# Patient Record
Sex: Female | Born: 1950 | Race: White | Hispanic: No | Marital: Married | Smoking: Never smoker
Health system: Southern US, Community
[De-identification: ages and names within clinical notes are randomized; demographics above are authoritative.]

## PROBLEM LIST (undated history)

## (undated) DIAGNOSIS — K759 Inflammatory liver disease, unspecified: Secondary | ICD-10-CM

## (undated) DIAGNOSIS — K219 Gastro-esophageal reflux disease without esophagitis: Secondary | ICD-10-CM

## (undated) DIAGNOSIS — R011 Cardiac murmur, unspecified: Secondary | ICD-10-CM

## (undated) DIAGNOSIS — I10 Essential (primary) hypertension: Secondary | ICD-10-CM

## (undated) DIAGNOSIS — I00 Rheumatic fever without heart involvement: Secondary | ICD-10-CM

## (undated) DIAGNOSIS — F419 Anxiety disorder, unspecified: Secondary | ICD-10-CM

## (undated) DIAGNOSIS — M1990 Unspecified osteoarthritis, unspecified site: Secondary | ICD-10-CM

## (undated) DIAGNOSIS — E785 Hyperlipidemia, unspecified: Secondary | ICD-10-CM

## (undated) DIAGNOSIS — E669 Obesity, unspecified: Secondary | ICD-10-CM

## (undated) DIAGNOSIS — E119 Type 2 diabetes mellitus without complications: Secondary | ICD-10-CM

## (undated) DIAGNOSIS — G4733 Obstructive sleep apnea (adult) (pediatric): Secondary | ICD-10-CM

## (undated) DIAGNOSIS — N6019 Diffuse cystic mastopathy of unspecified breast: Secondary | ICD-10-CM

## (undated) HISTORY — DX: Obstructive sleep apnea (adult) (pediatric): G47.33

## (undated) HISTORY — PX: TONSILLECTOMY: SHX5217

## (undated) HISTORY — DX: Anxiety disorder, unspecified: F41.9

## (undated) HISTORY — DX: Cardiac murmur, unspecified: R01.1

## (undated) HISTORY — PX: FOOT SURGERY: SHX648

## (undated) HISTORY — DX: Obesity, unspecified: E66.9

## (undated) HISTORY — DX: Gastro-esophageal reflux disease without esophagitis: K21.9

## (undated) HISTORY — DX: Essential (primary) hypertension: I10

## (undated) HISTORY — DX: Inflammatory liver disease, unspecified: K75.9

## (undated) HISTORY — DX: Unspecified osteoarthritis, unspecified site: M19.90

## (undated) HISTORY — PX: CARDIAC CATHETERIZATION: SHX172

## (undated) HISTORY — DX: Hyperlipidemia, unspecified: E78.5

## (undated) HISTORY — PX: BREAST BIOPSY: SHX20

## (undated) HISTORY — DX: Diffuse cystic mastopathy of unspecified breast: N60.19

## (undated) HISTORY — DX: Rheumatic fever without heart involvement: I00

## (undated) HISTORY — PX: OTHER SURGICAL HISTORY: SHX169

---

## 1992-07-21 HISTORY — PX: TOTAL ABDOMINAL HYSTERECTOMY: SHX209

## 2004-05-30 ENCOUNTER — Ambulatory Visit: Payer: Self-pay | Admitting: General Surgery

## 2005-06-05 ENCOUNTER — Ambulatory Visit: Payer: Self-pay | Admitting: General Surgery

## 2006-06-23 ENCOUNTER — Ambulatory Visit: Payer: Self-pay | Admitting: General Surgery

## 2006-06-30 ENCOUNTER — Ambulatory Visit: Payer: Self-pay | Admitting: Gastroenterology

## 2006-07-21 HISTORY — PX: COLONOSCOPY: SHX174

## 2007-03-26 ENCOUNTER — Ambulatory Visit: Payer: Self-pay | Admitting: Internal Medicine

## 2007-04-30 ENCOUNTER — Ambulatory Visit: Payer: Self-pay | Admitting: Internal Medicine

## 2007-07-08 ENCOUNTER — Ambulatory Visit: Payer: Self-pay | Admitting: Gastroenterology

## 2007-09-21 ENCOUNTER — Ambulatory Visit: Payer: Self-pay | Admitting: General Surgery

## 2008-05-23 LAB — CONVERTED CEMR LAB
ALT: 43 units/L
AST: 26 units/L
Albumin: 4.5 g/dL
BUN: 20 mg/dL
Glomerular Filtration Rate, Af Am: >59 mL/min/{1.73_m2}
Glucose, Bld: 108 mg/dL
HDL: 53 mg/dL
Total Protein: 7.3 g/dL

## 2008-07-11 ENCOUNTER — Encounter: Payer: Self-pay | Admitting: Cardiology

## 2008-09-27 ENCOUNTER — Ambulatory Visit: Payer: Self-pay | Admitting: General Surgery

## 2008-11-09 LAB — CONVERTED CEMR LAB
AST: 28 units/L
BUN: 22 mg/dL
CO2: 22 meq/L
Cholesterol CEMR: 258 mg/dL
Creatinine, Ser: 0.82 mg/dL
GFR calc non Af Amer: >59 mL/min
Glomerular Filtration Rate, Af Am: >59 mL/min/{1.73_m2}
Total Bilirubin: 0.4 mg/dL

## 2009-05-22 ENCOUNTER — Ambulatory Visit: Payer: Self-pay | Admitting: Cardiology

## 2009-05-22 DIAGNOSIS — R079 Chest pain, unspecified: Secondary | ICD-10-CM

## 2009-05-22 DIAGNOSIS — I10 Essential (primary) hypertension: Secondary | ICD-10-CM | POA: Insufficient documentation

## 2009-05-22 DIAGNOSIS — E785 Hyperlipidemia, unspecified: Secondary | ICD-10-CM | POA: Insufficient documentation

## 2009-05-22 DIAGNOSIS — Z8679 Personal history of other diseases of the circulatory system: Secondary | ICD-10-CM

## 2009-05-22 DIAGNOSIS — E1169 Type 2 diabetes mellitus with other specified complication: Secondary | ICD-10-CM | POA: Insufficient documentation

## 2009-05-23 ENCOUNTER — Ambulatory Visit: Payer: Self-pay | Admitting: Internal Medicine

## 2009-05-23 ENCOUNTER — Encounter: Payer: Self-pay | Admitting: Cardiology

## 2009-05-28 LAB — CONVERTED CEMR LAB
ALT: 28 units/L (ref 0–35)
AST: 20 units/L (ref 0–37)
Alkaline Phosphatase: 52 units/L (ref 39–117)
Calcium: 10.2 mg/dL (ref 8.4–10.5)
Chloride: 98 meq/L (ref 96–112)
Creatinine, Ser: 0.67 mg/dL (ref 0.40–1.20)
LDL Cholesterol: 130 mg/dL — ABNORMAL HIGH (ref 0–99)
Potassium: 5.1 meq/L (ref 3.5–5.3)
Total CHOL/HDL Ratio: 4.9

## 2009-06-05 ENCOUNTER — Ambulatory Visit: Payer: Self-pay

## 2009-06-05 ENCOUNTER — Encounter: Payer: Self-pay | Admitting: Cardiology

## 2009-06-05 ENCOUNTER — Ambulatory Visit: Payer: Self-pay | Admitting: Cardiovascular Disease

## 2009-06-07 ENCOUNTER — Ambulatory Visit: Payer: Self-pay | Admitting: Cardiology

## 2009-06-12 LAB — CONVERTED CEMR LAB
Glucose, Bld: 90 mg/dL (ref 70–99)
Potassium: 3.8 meq/L (ref 3.5–5.3)
Sodium: 136 meq/L (ref 135–145)

## 2009-08-09 ENCOUNTER — Encounter: Payer: Self-pay | Admitting: Cardiology

## 2009-08-09 ENCOUNTER — Ambulatory Visit: Payer: Self-pay | Admitting: Internal Medicine

## 2009-08-14 LAB — CONVERTED CEMR LAB
ALT: 19 units/L (ref 0–35)
Albumin: 4.5 g/dL (ref 3.5–5.2)
CO2: 26 meq/L (ref 19–32)
Calcium: 8.9 mg/dL (ref 8.4–10.5)
Chloride: 97 meq/L (ref 96–112)
Cholesterol: 196 mg/dL (ref 0–200)
Glucose, Bld: 106 mg/dL — ABNORMAL HIGH (ref 70–99)
Potassium: 4.1 meq/L (ref 3.5–5.3)
Sodium: 140 meq/L (ref 135–145)
Total Bilirubin: 0.4 mg/dL (ref 0.3–1.2)
Total Protein: 7 g/dL (ref 6.0–8.3)
Triglycerides: 147 mg/dL (ref <150)

## 2009-09-19 ENCOUNTER — Encounter: Payer: Self-pay | Admitting: Cardiology

## 2009-09-28 ENCOUNTER — Ambulatory Visit: Payer: Self-pay | Admitting: General Surgery

## 2009-12-26 ENCOUNTER — Ambulatory Visit: Payer: Self-pay | Admitting: Cardiovascular Disease

## 2010-07-21 DIAGNOSIS — N6019 Diffuse cystic mastopathy of unspecified breast: Secondary | ICD-10-CM

## 2010-07-21 HISTORY — DX: Diffuse cystic mastopathy of unspecified breast: N60.19

## 2010-08-20 NOTE — Assessment & Plan Note (Signed)
Summary: EC6/AMD   Visit Type:  Initial Consult Primary Provider:  Corky Downs, MD  CC:  pt. previously saw Dr. Shirlee Latch. edema in feet.Marland Kitchen  History of Present Illness: 60 yo with history of HTN, hyperlipidemia, and family history of CAD comes for followup.   She reports that overall she has been doing well. Her blood pressure has been reasonably well-controlled at home. Her work has been stressful. She has noticed worsening lower extremity edema on the amlodipine, possibly also from the Diovan. She like to change her medications to decrease her swelling. She did try TED hose but by the end of the day, these were very tight and uncomfortable.  strong family history of coronary artery disease. The patient has never smoked.   Labs (11/10): creatinine 0.59, K 3.8, LDL 130, HDL 44, LFTs normal  Current Medications (verified): 1)  Diovan Hct 320-25 Mg Tabs (Valsartan-Hydrochlorothiazide) .Marland Kitchen.. 1 Tab By Mouth Daily 2)  Protonix 40 Mg Tbec (Pantoprazole Sodium) .Marland Kitchen.. 1 By Mouth Once Daily 3)  Crestor 20 Mg Tabs (Rosuvastatin Calcium) .... Take One Tablet By Mouth Daily. 4)  Furosemide 20 Mg Tabs (Furosemide) .... Take One Tablet By Mouth Daily or As Needed 5)  Alprazolam 0.25 Mg Tabs (Alprazolam) .Marland Kitchen.. 1 By Mouth At Bedtime, As Needed 6)  Loratadine 10 Mg Tabs (Loratadine) .Marland Kitchen.. 1 By Mouth Once Daily 7)  Aspirin 81 Mg Tbec (Aspirin) .... Take One Tablet By Mouth Daily 8)  Melatonin 3 Mg Tabs (Melatonin) .Marland Kitchen.. 1 By Mouth Once Daily 9)  Tylenol Extra Strength 500 Mg Tabs (Acetaminophen) .Marland Kitchen.. 10-12 By Mouth Once Daily 10)  Norvasc 10 Mg Tabs (Amlodipine Besylate) .Marland Kitchen.. 1 Tab By Mouth Daily 11)  Vitamin D 1000 Unit  Tabs (Cholecalciferol) .... 2 Tab Once Daily  Allergies (verified): No Known Drug Allergies  Past History:  Past Medical History: Last updated: 06/07/2009 1. Anxiety 2. Arthritis 3. G E R D 4. Hyperlipidemia 5. Hypertension for > 10 years 6. Hx as child of rheumatic fever 7. Hx as  child of hepatitis 8. Heart Murmur 9. ETT-myoview 12/09 (Dr. Juel Burrow): No evidence for ischemia, good exercise tolerance (9 minutes) 10. Mild OSA, uses CPAP 11. Obesity 12. Echo (11/10): EF 60-65%, normal RV, PASP 25, normal valves  Past Surgical History: Last updated: 05/22/2009 Abdominal Hysterectomy-Total Tonsillectomy Reconstruction foot and bunion surgery both feet Carpel tunnel right hand Arthroscopic sugery on left elbow   Family History: Last updated: 05/22/2009 Father: living 66: DM, HTN, PPM Mother: deceased 65: MS, hyperlipidemia Brother: deceased 27; brain aneurysm Sister: living 56; 1 MI in late 29s, 1 MI at 37, HTN, hyperlipidemia  Social History: Last updated: 05/22/2009 Works full time Married  Tobacco Use - No.  Alcohol Use - yes - social Regular Exercise - no Drug Use - no  Risk Factors: Alcohol Use: <1 (05/22/2009) Caffeine Use: decaf coffee (05/22/2009) Diet: Weight Watchers (05/22/2009) Exercise: no (05/22/2009)  Risk Factors: Smoking Status: never (05/22/2009)  Review of Systems       The patient complains of weight gain and peripheral edema.  The patient denies fever, weight loss, vision loss, decreased hearing, hoarseness, chest pain, syncope, dyspnea on exertion, prolonged cough, abdominal pain, incontinence, muscle weakness, depression, and enlarged lymph nodes.    Vital Signs:  Patient profile:   60 year old female Height:      64 inches Weight:      229 pounds BMI:     39.45 Pulse rate:   79 / minute BP sitting:  156 / 97  (left arm) Cuff size:   large  Vitals Entered By: Bishop Dublin, CMA (December 26, 2009 3:35 PM)  Physical Exam  General:  Well developed, well nourished, in no acute distress. Head:  normocephalic and atraumatic Neck:  Neck supple, no JVD. No masses, thyromegaly or abnormal cervical nodes. Lungs:  Clear bilaterally to auscultation and percussion. Heart:  Non-displaced PMI, chest non-tender; regular rate and  rhythm, S1, S2 without murmurs, rubs or gallops. Carotid upstroke normal, no bruit.  Pedals normal pulses. No edema, no varicosities. Abdomen:  Bowel sounds positive; abdomen soft and non-tender without masses Msk:  Back normal, normal gait. Muscle strength and tone normal. Pulses:  pulses normal in all 4 extremities Extremities:  No clubbing or cyanosis. Neurologic:  Alert and oriented x 3. Skin:  Intact without lesions or rashes. Psych:  Normal affect.   EKG  Procedure date:  12/26/2009  Findings:      Normal sinus rhythm with rate 79 beats per minute, no significant ST or T wave changes.  Impression & Recommendations:  Problem # 1:  HYPERTENSION, UNSPECIFIED (ICD-401.9) we have made several medication changes to her regiment. She does not like the lower extremity swelling and attributes this to Diovan as well as amlodipine.  We will discontinue her amlodipine. Discontinue the Diovan. We will start lisinopril 40 mg daily, increase her HCTZ to 50 mg daily though I have suggested she could take 25 mg if her blood pressure is well controlled, we have also added clonidine 0.1 mg b.i.d.   The following medications were removed from the medication list:    Diovan Hct 320-25 Mg Tabs (Valsartan-hydrochlorothiazide) .Marland Kitchen... 1 tab by mouth daily Her updated medication list for this problem includes:    Furosemide 20 Mg Tabs (Furosemide) .Marland Kitchen... Take one tablet by mouth daily or as needed    Aspirin 81 Mg Tbec (Aspirin) .Marland Kitchen... Take one tablet by mouth daily    Norvasc 10 Mg Tabs (Amlodipine besylate) .Marland Kitchen... 1 tab by mouth daily    Lisinopril 40 Mg Tabs (Lisinopril) .Marland Kitchen... Take one tablet by mouth daily    Hydrochlorothiazide 50 Mg Tabs (Hydrochlorothiazide) .Marland Kitchen... Take one tablet by mouth daily.    Clonidine Hcl 0.1 Mg Tabs (Clonidine hcl) .Marland Kitchen... Take one tablet by mouth twice a day  Problem # 2:  HYPERLIPIDEMIA-MIXED (ICD-272.4) She has a strong family history of coronary artery disease. Her LDL  continues to be slightly above our goal of LDL less than 100. We'll add zetia 10 mg daily. She is due to have her cholesterol checked in one month's time with her primary care physician.  Her updated medication list for this problem includes:    Crestor 20 Mg Tabs (Rosuvastatin calcium) .Marland Kitchen... Take one tablet by mouth daily.    Zetia 10 Mg Tabs (Ezetimibe) .Marland Kitchen... Take one tablet by mouth daily.   Patient Instructions: 1)  Your physician has recommended you make the following change in your medication: STOP Diovan  Start HCTZ 50mg  daily, Lisinopril 40mg  daily, Clonidine 0.1 mg two times a day, Zetia 10 mg daily  2)  Your physician wants you to follow-up in:   6 months You will receive a reminder letter in the mail two months in advance. If you don't receive a letter, please call our office to schedule the follow-up appointment. Prescriptions: ZETIA 10 MG TABS (EZETIMIBE) Take one tablet by mouth daily.  #30 x 6   Entered by:   Benedict Needy, RN   Authorized  by:   Dossie Arbour MD   Signed by:   Benedict Needy, RN on 12/26/2009   Method used:   Electronically to        [facility]. # [phone]* (retail)       [address]       [place], [place]  [address]       Ph: [phone]       Fax: [phone]   RxID:   [id number] CLONIDINE HCL 0.1 MG TABS (CLONIDINE HCL) Take one tablet by mouth twice a day  #60 x 6   Entered by:   Benedict Needy, RN   Authorized by:   Dossie Arbour MD   Signed by:   Benedict Needy, RN on 12/26/2009   Method used:   Electronically to        [facility]. # [phone]* (retail)       [address]       [place], [place]  [address]       Ph: [phone]       Fax: [phone]   RxID:   [id number] HYDROCHLOROTHIAZIDE 50 MG TABS (HYDROCHLOROTHIAZIDE) Take one tablet by mouth daily.  #30 x 6   Entered by:   Benedict Needy, RN   Authorized by:   Dossie Arbour MD   Signed by:   Benedict Needy, RN on 12/26/2009   Method used:    Electronically to        [facility]. # [phone]* (retail)       [address]       [place], [place]  [address]       Ph: [phone]       Fax: [phone]   RxID:   [id number] LISINOPRIL 40 MG TABS (LISINOPRIL) Take one tablet by mouth daily  #30 x 6   Entered by:   Benedict Needy, RN   Authorized by:   Dossie Arbour MD   Signed by:   Benedict Needy, RN on 12/26/2009   Method used:   Electronically to        [facility]. # [phone]* (retail)       [address]       [place], [place]  [address]       Ph: [phone]       Fax: [phone]   RxID:   [id number]

## 2010-08-20 NOTE — Letter (Signed)
Summary: Medical Record Release  Medical Record Release   Imported By: Harlon Flor 09/21/2009 11:25:09  _____________________________________________________________________  External Attachment:    Type:   Image     Comment:   External Document

## 2010-09-04 ENCOUNTER — Encounter: Payer: Self-pay | Admitting: Cardiovascular Disease

## 2010-09-04 ENCOUNTER — Ambulatory Visit (INDEPENDENT_AMBULATORY_CARE_PROVIDER_SITE_OTHER): Payer: BC Managed Care – PPO | Admitting: Cardiovascular Disease

## 2010-09-04 DIAGNOSIS — I10 Essential (primary) hypertension: Secondary | ICD-10-CM

## 2010-09-04 DIAGNOSIS — E785 Hyperlipidemia, unspecified: Secondary | ICD-10-CM

## 2010-09-11 ENCOUNTER — Encounter: Payer: Self-pay | Admitting: Cardiovascular Disease

## 2010-09-11 ENCOUNTER — Other Ambulatory Visit (INDEPENDENT_AMBULATORY_CARE_PROVIDER_SITE_OTHER): Payer: BC Managed Care – PPO

## 2010-09-11 DIAGNOSIS — R5381 Other malaise: Secondary | ICD-10-CM | POA: Insufficient documentation

## 2010-09-11 DIAGNOSIS — R5383 Other fatigue: Secondary | ICD-10-CM

## 2010-09-11 DIAGNOSIS — E559 Vitamin D deficiency, unspecified: Secondary | ICD-10-CM

## 2010-09-11 NOTE — Assessment & Plan Note (Signed)
Summary: f23m/sab   Visit Type:  Follow-up Primary Provider:  Cher Brock  CC:  "doing well" denies chest pain and SOB.Erica Brock  History of Present Illness: 60 yo with history of HTN, hyperlipidemia, obesity and family history of CAD comes for followup.   she feels well with no significant symptoms of edema, shortness of breath or chest pain. After stopping amlodipine, her edema did improve. She has been tolerating her medications well with no symptoms.   she reports having lost 19 pounds through watching her diet.   she does have a strong family history of coronary artery disease. The patient has never smoked.   Labs (11/10): creatinine 0.59, K 3.8, LDL 130, HDL 44, LFTs normal  Current Medications (verified): 1)  Protonix 40 Mg Tbec (Pantoprazole Sodium) .Erica Brock.. 1 By Mouth Once Daily 2)  Crestor 20 Mg Tabs (Rosuvastatin Calcium) .... Take One Tablet By Mouth Daily. 3)  Furosemide 20 Mg Tabs (Furosemide) .... Take One Tablet By Mouth Daily or As Needed 4)  Alprazolam 0.25 Mg Tabs (Alprazolam) .Erica Brock.. 1 By Mouth At Bedtime, As Needed 5)  Loratadine 10 Mg Tabs (Loratadine) .Erica Brock.. 1 By Mouth Once Daily 6)  Aspirin 81 Mg Tbec (Aspirin) .... Take One Tablet By Mouth Daily 7)  Melatonin 3 Mg Tabs (Melatonin) .Erica Brock.. 1 By Mouth Once Daily 8)  Tylenol Extra Strength 500 Mg Tabs (Acetaminophen) .Erica Brock.. 10-12 By Mouth Once Daily 9)  Norvasc 10 Mg Tabs (Amlodipine Besylate) .Erica Brock.. 1 Tab By Mouth Daily 10)  Vitamin D 1000 Unit  Tabs (Cholecalciferol) .... 2 Tab Once Daily 11)  Lisinopril 40 Mg Tabs (Lisinopril) .... Take One Tablet By Mouth Daily 12)  Hydrochlorothiazide 50 Mg Tabs (Hydrochlorothiazide) .... Take One Tablet By Mouth Daily. 13)  Clonidine Hcl 0.1 Mg Tabs (Clonidine Hcl) .... Take One Tablet By Mouth Twice A Day 14)  Zetia 10 Mg Tabs (Ezetimibe) .... Take One Tablet By Mouth Daily. 15)  Mobic 7.5 Mg Tabs (Meloxicam) .Erica Brock.. 1 Tablet Two Times A Day  Allergies (verified): No Known Drug  Allergies  Past History:  Past Medical History: Last updated: 06/07/2009 1. Anxiety 2. Arthritis 3. G E R D 4. Hyperlipidemia 5. Hypertension for > 10 years 6. Hx as child of rheumatic fever 7. Hx as child of hepatitis 8. Heart Murmur 9. ETT-myoview 12/09 (Dr. Juel Burrow): No evidence for ischemia, good exercise tolerance (9 minutes) 10. Mild OSA, uses CPAP 11. Obesity 12. Echo (11/10): EF 60-65%, normal RV, PASP 25, normal valves  Past Surgical History: Last updated: 05/22/2009 Abdominal Hysterectomy-Total Tonsillectomy Reconstruction foot and bunion surgery both feet Carpel tunnel right hand Arthroscopic sugery on left elbow   Family History: Last updated: 05/22/2009 Father: living 93: DM, HTN, PPM Mother: deceased 66: MS, hyperlipidemia Brother: deceased 61; brain aneurysm Sister: living 32; 1 MI in late 98s, 1 MI at 12, HTN, hyperlipidemia  Social History: Last updated: 05/22/2009 Works full time Married  Tobacco Use - No.  Alcohol Use - yes - social Regular Exercise - no Drug Use - no  Risk Factors: Alcohol Use: <1 (05/22/2009) Caffeine Use: decaf coffee (05/22/2009) Diet: Weight Watchers (05/22/2009) Exercise: no (05/22/2009)  Risk Factors: Smoking Status: never (05/22/2009)  Review of Systems       The patient complains of weight loss.  The patient denies fever, weight gain, vision loss, decreased hearing, hoarseness, chest pain, syncope, dyspnea on exertion, peripheral edema, prolonged cough, abdominal pain, incontinence, muscle weakness, depression, and enlarged lymph nodes.    Vital Signs:  Patient profile:   60 year old female Height:      64 inches Weight:      210.50 pounds BMI:     36.26 Pulse rate:   75 / minute BP sitting:   146 / 83  (left arm) Cuff size:   large  Vitals Entered By: Lysbeth Galas CMA (September 04, 2010 4:11 PM)  Physical Exam  General:  Well developed, well nourished, in no acute distress.obese Head:  normocephalic  and atraumatic Neck:  Neck supple, no JVD. No masses, thyromegaly or abnormal cervical nodes. Lungs:  Clear bilaterally to auscultation and percussion. Heart:  Non-displaced PMI, chest non-tender; regular rate and rhythm, S1, S2 without murmurs, rubs or gallops. Carotid upstroke normal, no bruit.  Pedals normal pulses. No edema, no varicosities. Abdomen:  Bowel sounds positive; abdomen soft and non-tender without masses Msk:  Back normal, normal gait. Muscle strength and tone normal. Pulses:  pulses normal in all 4 extremities Extremities:  No clubbing or cyanosis. Neurologic:  Alert and oriented x 3. Skin:  Intact without lesions or rashes. Psych:  Normal affect.   Impression & Recommendations:  Problem # 1:  HYPERTENSION, UNSPECIFIED (ICD-401.9) BP is well controlled. no medication changes were made.  The following medications were removed from the medication list:    Norvasc 10 Mg Tabs (Amlodipine besylate) .Erica Brock... 1 tab by mouth daily Her updated medication list for this problem includes:    Furosemide 20 Mg Tabs (Furosemide) .Erica Brock... Take one tablet by mouth daily or as needed    Aspirin 81 Mg Tbec (Aspirin) .Erica Brock... Take one tablet by mouth daily    Lisinopril 40 Mg Tabs (Lisinopril) .Erica Brock... Take one tablet by mouth daily    Hydrochlorothiazide 50 Mg Tabs (Hydrochlorothiazide) .Erica Brock... Take one tablet by mouth daily.    Clonidine Hcl 0.1 Mg Tabs (Clonidine hcl) .Erica Brock... Take one tablet by mouth twice a day  Problem # 2:  HYPERLIPIDEMIA-MIXED (ICD-272.4) We will check her cholesterol and LFTs this week. Continue Crestor and zetia  Her updated medication list for this problem includes:    Crestor 20 Mg Tabs (Rosuvastatin calcium) .Erica Brock... Take one tablet by mouth daily.    Zetia 10 Mg Tabs (Ezetimibe) .Erica Brock... Take one tablet by mouth daily.  Other Orders: EKG w/ Interpretation (93000)  Patient Instructions: 1)  Your physician recommends that you schedule a follow-up appointment in: 1 year 2)   Your physician recommends that you return for a FASTING lipid profile: Please call our office and bring your lab order and we can draw labs. 3)  Your physician has recommended you make the following change in your medication: START Crestor 20mg  once daily. Prescriptions: CRESTOR 20 MG TABS (ROSUVASTATIN CALCIUM) Take one tablet by mouth daily.  #30 x 6   Entered by:   Lanny Hurst RN   Authorized by:   Dossie Arbour MD   Signed by:   Lanny Hurst RN on 09/04/2010   Method used:   Electronically to        [facility]. # [phone]* (retail)       [address]       [place], [place]  [address]       Ph: [phone]       Fax: [phone]   RxID:   [id number]   Appended Document: f59m/sab EKG shows normal sinus rhythm with rate of 75 beats per minute, left axis deviation

## 2010-09-16 LAB — CONVERTED CEMR LAB
CO2: 25 meq/L (ref 19–32)
Cholesterol: 132 mg/dL (ref 0–200)
Eosinophils Relative: 2 % (ref 0–5)
Glucose, Bld: 130 mg/dL — ABNORMAL HIGH (ref 70–99)
HCT: 35.2 % — ABNORMAL LOW (ref 36.0–46.0)
Hemoglobin: 11.7 g/dL — ABNORMAL LOW (ref 12.0–15.0)
Lymphocytes Relative: 33 % (ref 12–46)
Lymphs Abs: 2 10*3/uL (ref 0.7–4.0)
Monocytes Absolute: 0.6 10*3/uL (ref 0.1–1.0)
Monocytes Relative: 10 % (ref 3–12)
Platelets: 253 10*3/uL (ref 150–400)
RBC: 3.7 M/uL — ABNORMAL LOW (ref 3.87–5.11)
Sodium: 135 meq/L (ref 135–145)
Total Bilirubin: 0.3 mg/dL (ref 0.3–1.2)
Total Protein: 6.5 g/dL (ref 6.0–8.3)
Triglycerides: 96 mg/dL (ref <150)
VLDL: 19 mg/dL (ref 0–40)
WBC: 6 10*3/uL (ref 4.0–10.5)

## 2010-10-02 ENCOUNTER — Ambulatory Visit: Payer: Self-pay | Admitting: General Surgery

## 2010-10-16 ENCOUNTER — Other Ambulatory Visit: Payer: Self-pay

## 2010-10-16 MED ORDER — EZETIMIBE 10 MG PO TABS: 10.0000 mg | ORAL_TABLET | Freq: Every day | ORAL | Status: DC

## 2011-02-05 ENCOUNTER — Encounter: Payer: Self-pay | Admitting: Cardiovascular Disease

## 2011-02-19 ENCOUNTER — Telehealth: Payer: Self-pay

## 2011-02-19 MED ORDER — CLONIDINE HCL 0.1 MG PO TABS: 0.1000 mg | ORAL_TABLET | Freq: Two times a day (BID) | ORAL | Status: DC

## 2011-02-19 NOTE — Telephone Encounter (Signed)
Requested a refill on clonidine.

## 2011-02-26 ENCOUNTER — Telehealth: Payer: Self-pay | Admitting: *Deleted

## 2011-02-26 MED ORDER — HYDROCHLOROTHIAZIDE 50 MG PO TABS: 50.0000 mg | ORAL_TABLET | Freq: Every day | ORAL | Status: DC

## 2011-02-26 NOTE — Telephone Encounter (Signed)
Request refill for Hydrochlorothiazide

## 2011-03-20 ENCOUNTER — Ambulatory Visit: Payer: Self-pay | Admitting: Specialist

## 2011-03-28 ENCOUNTER — Ambulatory Visit: Payer: Self-pay | Admitting: Specialist

## 2011-04-21 ENCOUNTER — Telehealth: Payer: Self-pay

## 2011-04-21 MED ORDER — LISINOPRIL 40 MG PO TABS: 40.0000 mg | ORAL_TABLET | Freq: Every day | ORAL | Status: DC

## 2011-04-21 NOTE — Telephone Encounter (Signed)
Refill sent for lisinopril 40 mg table daily.

## 2011-05-01 ENCOUNTER — Other Ambulatory Visit: Payer: Self-pay | Admitting: *Deleted

## 2011-05-01 MED ORDER — ROSUVASTATIN CALCIUM 20 MG PO TABS: 20.0000 mg | ORAL_TABLET | Freq: Every day | ORAL | Status: DC

## 2011-05-27 ENCOUNTER — Other Ambulatory Visit: Payer: Self-pay | Admitting: Cardiovascular Disease

## 2011-05-27 NOTE — Telephone Encounter (Signed)
Refill sent for zetia.  

## 2011-09-01 ENCOUNTER — Encounter: Payer: Self-pay | Admitting: *Deleted

## 2011-09-02 ENCOUNTER — Ambulatory Visit (INDEPENDENT_AMBULATORY_CARE_PROVIDER_SITE_OTHER): Payer: BC Managed Care – PPO | Admitting: Cardiovascular Disease

## 2011-09-02 ENCOUNTER — Encounter: Payer: Self-pay | Admitting: Cardiovascular Disease

## 2011-09-02 ENCOUNTER — Encounter: Payer: Self-pay | Admitting: *Deleted

## 2011-09-02 VITALS — BP 146/99 | HR 88 | Ht 64.0 in | Wt 214.8 lb

## 2011-09-02 DIAGNOSIS — Z9989 Dependence on other enabling machines and devices: Secondary | ICD-10-CM

## 2011-09-02 DIAGNOSIS — G4700 Insomnia, unspecified: Secondary | ICD-10-CM | POA: Insufficient documentation

## 2011-09-02 DIAGNOSIS — E785 Hyperlipidemia, unspecified: Secondary | ICD-10-CM

## 2011-09-02 DIAGNOSIS — G4733 Obstructive sleep apnea (adult) (pediatric): Secondary | ICD-10-CM | POA: Insufficient documentation

## 2011-09-02 DIAGNOSIS — I10 Essential (primary) hypertension: Secondary | ICD-10-CM

## 2011-09-02 DIAGNOSIS — E669 Obesity, unspecified: Secondary | ICD-10-CM

## 2011-09-02 NOTE — Assessment & Plan Note (Signed)
She has obstructive sleep apnea and is on CPAP. She may benefit from an auto titration study to Reassess her pressures given her insomnia.

## 2011-09-02 NOTE — Patient Instructions (Signed)
You are doing well. No medication changes were made.  Please call us if you have new issues that need to be addressed before your next appt.  Your physician wants you to follow-up in: 12 months.  You will receive a reminder letter in the mail two months in advance. If you don't receive a letter, please call our office to schedule the follow-up appointment. 

## 2011-09-02 NOTE — Assessment & Plan Note (Addendum)
Cholesterol is at goal on the current lipid regimen. No changes to the medications were made. We did suggest she could hold her zetia with a recheck of her labs in several months in an effort to possibly save money.

## 2011-09-02 NOTE — Assessment & Plan Note (Signed)
We have encouraged continued exercise, careful diet management in an effort to lose weight. 

## 2011-09-02 NOTE — Progress Notes (Signed)
Patient ID: Erica Brock, female    DOB: 1951/02/14, 61 y.o.   MRN: [id number]  HPI Comments: 61 yo with history of HTN, hyperlipidemia, obesity, Obstructive sleep apnea who wears CPAP, and family history of CAD comes for followup.     she feels well with no significant symptoms of edema, shortness of breath or chest pain. She has poor sleep and wakes frequently during the night. She has tinnitus which has been chronic. She has rare episodes of dizziness, typically when standing from a sitting position.  Blood pressure at home has typically been well controlled with systolic pressures in the 130 range.  The patient has never smoked.     EKG shows normal sinus rhythm with rate 80 beats per minute no significant ST or T wave changes, left axis deviation      Outpatient Encounter Prescriptions as of 09/02/2011  Medication Sig Dispense Refill  . acetaminophen (TYLENOL) 500 MG tablet Take 500 mg by mouth every 6 (six) hours as needed.        Marland Kitchen aspirin 81 MG EC tablet Take 81 mg by mouth daily.        . cholecalciferol (VITAMIN D) 1000 UNITS tablet Take 2,000 Units by mouth daily.        . cloNIDine (CATAPRES) 0.1 MG tablet Take 1 tablet (0.1 mg total) by mouth 2 (two) times daily.  60 tablet  6  . hydrochlorothiazide 50 MG tablet Take 1 tablet (50 mg total) by mouth daily.  30 tablet  6  . lisinopril (PRINIVIL,ZESTRIL) 40 MG tablet Take 1 tablet (40 mg total) by mouth daily.  30 tablet  6  . loratadine (CLARITIN) 10 MG tablet Take 10 mg by mouth as needed.       . Melatonin 10 MG TABS Take 1 tablet by mouth at bedtime.      . pantoprazole (PROTONIX) 40 MG tablet Take 40 mg by mouth daily.        . rosuvastatin (CRESTOR) 20 MG tablet Take 1 tablet (20 mg total) by mouth daily.  30 tablet  6  . ZETIA 10 MG tablet TAKE 1 TABLET BY MOUTH DAILY  30 tablet  6  . furosemide (LASIX) 20 MG tablet Take 20 mg by mouth as needed.        Review of Systems  Constitutional: Negative.   HENT: Negative.     Eyes: Negative.   Respiratory: Negative.   Cardiovascular: Negative.   Gastrointestinal: Negative.   Musculoskeletal: Negative.   Skin: Negative.   Neurological: Positive for dizziness.  Hematological: Negative.   Psychiatric/Behavioral: Negative.        Poor sleep  All other systems reviewed and are negative.     BP 146/99  Pulse 88  Ht 5\' 4"  (1.626 m)  Wt 214 lb 12.8 oz (97.433 kg)  BMI 36.87 kg/m2  Physical Exam  Nursing note and vitals reviewed. Constitutional: She is oriented to person, place, and time. She appears well-developed and well-nourished.       Obese  HENT:  Head: Normocephalic.  Nose: Nose normal.  Mouth/Throat: Oropharynx is clear and moist.  Eyes: Conjunctivae are normal. Pupils are equal, round, and reactive to light.  Neck: Normal range of motion. Neck supple. No JVD present.  Cardiovascular: Normal rate, regular rhythm, S1 normal, S2 normal, normal heart sounds and intact distal pulses.  Exam reveals no gallop and no friction rub.   No murmur heard. Pulmonary/Chest: Effort normal and breath sounds normal. No respiratory  distress. She has no wheezes. She has no rales. She exhibits no tenderness.  Abdominal: Soft. Bowel sounds are normal. She exhibits no distension. There is no tenderness.  Musculoskeletal: Normal range of motion. She exhibits no edema and no tenderness.  Lymphadenopathy:    She has no cervical adenopathy.  Neurological: She is alert and oriented to person, place, and time. Coordination normal.  Skin: Skin is warm and dry. No rash noted. No erythema.  Psychiatric: She has a normal mood and affect. Her behavior is normal. Judgment and thought content normal.         Assessment and Plan

## 2011-09-02 NOTE — Assessment & Plan Note (Signed)
Blood pressure is elevated on today's visit. We did suggest she monitor her blood pressure at home. If it continues to be elevated, she could increase her clonidine.

## 2011-09-11 ENCOUNTER — Other Ambulatory Visit: Payer: Self-pay | Admitting: Cardiovascular Disease

## 2011-09-18 ENCOUNTER — Other Ambulatory Visit: Payer: Self-pay | Admitting: Cardiovascular Disease

## 2011-10-05 ENCOUNTER — Ambulatory Visit: Payer: Self-pay | Admitting: Family Medicine

## 2011-10-15 ENCOUNTER — Ambulatory Visit: Payer: Self-pay | Admitting: General Surgery

## 2011-11-06 ENCOUNTER — Other Ambulatory Visit: Payer: Self-pay | Admitting: Cardiovascular Disease

## 2011-11-20 ENCOUNTER — Other Ambulatory Visit: Payer: Self-pay | Admitting: Cardiovascular Disease

## 2011-11-21 ENCOUNTER — Other Ambulatory Visit: Payer: Self-pay | Admitting: *Deleted

## 2011-11-21 MED ORDER — ROSUVASTATIN CALCIUM 20 MG PO TABS: 20.0000 mg | ORAL_TABLET | Freq: Every day | ORAL | Status: DC

## 2011-11-21 NOTE — Telephone Encounter (Signed)
Refilled Crestor. 

## 2011-12-25 ENCOUNTER — Other Ambulatory Visit: Payer: Self-pay | Admitting: Cardiovascular Disease

## 2011-12-25 NOTE — Telephone Encounter (Signed)
Refilled Zetia.

## 2012-02-05 ENCOUNTER — Ambulatory Visit: Payer: Self-pay | Admitting: Family Medicine

## 2012-03-15 ENCOUNTER — Other Ambulatory Visit: Payer: Self-pay | Admitting: Cardiovascular Disease

## 2012-03-15 NOTE — Telephone Encounter (Signed)
Refilled Clonidine

## 2012-03-23 ENCOUNTER — Other Ambulatory Visit: Payer: Self-pay | Admitting: Cardiovascular Disease

## 2012-03-24 ENCOUNTER — Other Ambulatory Visit: Payer: Self-pay | Admitting: *Deleted

## 2012-03-24 MED ORDER — ROSUVASTATIN CALCIUM 20 MG PO TABS: 20.0000 mg | ORAL_TABLET | Freq: Every day | ORAL | Status: DC

## 2012-03-24 MED ORDER — HYDROCHLOROTHIAZIDE 50 MG PO TABS: 50.0000 mg | ORAL_TABLET | Freq: Every day | ORAL | Status: DC

## 2012-03-24 NOTE — Telephone Encounter (Signed)
Refilled Hydrochlorothiazide and Crestor.

## 2012-05-04 ENCOUNTER — Telehealth: Payer: Self-pay | Admitting: Cardiovascular Disease

## 2012-05-04 NOTE — Telephone Encounter (Signed)
Pt would like to know if we have any discount coupons for her Crestor 20mg  and Zetia 10mg .  She states the cost is going up due to insurance change and she may need to discuss cheaper alternatives.  Please call pt to advise.

## 2012-05-04 NOTE — Telephone Encounter (Signed)
Please see below. thanks

## 2012-05-05 NOTE — Telephone Encounter (Signed)
Patient called back to see if this message had been addressed yet

## 2012-05-05 NOTE — Telephone Encounter (Signed)
Notified patient we do have some coupons for zetia and crestor. Told patient we would give samples of crestor and zetia until she decides if the coupons will help the cost or not.

## 2012-05-07 NOTE — Telephone Encounter (Signed)
Dr. Mariah Milling the patient would like to switch her cholesterol medications to something else not so expensive. She did take a coupon and medication still too expensive. Please advise.

## 2012-05-07 NOTE — Telephone Encounter (Signed)
Pt decided that the coupons are not enough to help with the cost.  Pt would like to discuss other medication options.  Please call to advise.

## 2012-05-10 NOTE — Telephone Encounter (Signed)
Best option is lipitor 40 mg daily, Highest dose is 80 mg daily Could try high dose later if needed

## 2012-05-11 ENCOUNTER — Other Ambulatory Visit: Payer: Self-pay | Admitting: Cardiovascular Disease

## 2012-05-11 NOTE — Telephone Encounter (Signed)
Refilled Lisinopril. 

## 2012-05-11 NOTE — Telephone Encounter (Signed)
See below

## 2012-05-14 NOTE — Telephone Encounter (Signed)
Notified patient since can't afford the crestor need to continue the samples of crestor then start on the new Rx of lipitor 40 mg take one tablet daily at bedtime. The patient will not be able to afford the zetia either. She will continue with her zetia samples that she was given. Told her that later if needed could increase the Lipitor to 80 mg daily.  Do you have any other suggestions for her?

## 2012-05-21 ENCOUNTER — Other Ambulatory Visit: Payer: Self-pay

## 2012-05-21 MED ORDER — ATORVASTATIN CALCIUM 40 MG PO TABS: 40.0000 mg | ORAL_TABLET | Freq: Every day | ORAL | Status: DC

## 2012-05-21 NOTE — Telephone Encounter (Signed)
Refill sent for lipitor 40 mg take one tablet daily at bedtime.

## 2012-09-10 ENCOUNTER — Encounter: Payer: Self-pay | Admitting: *Deleted

## 2012-09-10 ENCOUNTER — Other Ambulatory Visit: Payer: Self-pay

## 2012-09-10 MED ORDER — HYDROCHLOROTHIAZIDE 50 MG PO TABS: 50.0000 mg | ORAL_TABLET | Freq: Every day | ORAL | Status: DC

## 2012-09-10 MED ORDER — LISINOPRIL 40 MG PO TABS: ORAL_TABLET | ORAL | Status: DC

## 2012-09-10 NOTE — Telephone Encounter (Signed)
Refill sent for HCTZ 50 mg take one table daily.

## 2012-09-10 NOTE — Telephone Encounter (Signed)
Refill sent for lisinopril  

## 2012-10-29 ENCOUNTER — Ambulatory Visit: Payer: Self-pay | Admitting: General Surgery

## 2012-11-02 ENCOUNTER — Encounter: Payer: Self-pay | Admitting: General Surgery

## 2012-11-08 ENCOUNTER — Encounter: Payer: Self-pay | Admitting: *Deleted

## 2012-11-09 ENCOUNTER — Ambulatory Visit (INDEPENDENT_AMBULATORY_CARE_PROVIDER_SITE_OTHER): Payer: Managed Care, Other (non HMO) | Admitting: General Surgery

## 2012-11-09 ENCOUNTER — Encounter: Payer: Self-pay | Admitting: General Surgery

## 2012-11-09 ENCOUNTER — Other Ambulatory Visit: Payer: Self-pay | Admitting: *Deleted

## 2012-11-09 VITALS — BP 138/72 | HR 76 | Resp 12 | Ht 64.0 in | Wt 214.0 lb

## 2012-11-09 DIAGNOSIS — N6019 Diffuse cystic mastopathy of unspecified breast: Secondary | ICD-10-CM

## 2012-11-09 DIAGNOSIS — Z1231 Encounter for screening mammogram for malignant neoplasm of breast: Secondary | ICD-10-CM

## 2012-11-09 NOTE — Progress Notes (Signed)
Patient will be asked to return to the office in one year for a bilateral screening mammogram. 

## 2012-11-09 NOTE — Patient Instructions (Addendum)
Patient is to return in 1 year with Bilateral Screening Mammogram.

## 2012-11-09 NOTE — Progress Notes (Signed)
Patient ID: Makyah Lavigne, female   DOB: 1950-11-22, 62 y.o.   MRN: [id number]  Chief Complaint  Patient presents with  . Follow-up    mammogram    HPI Maelani Yarbro is a 62 y.o. female who presents for follow up mammogram. The most recent mammogram was done on 10/29/12 at [facility] with birad category 2. The patient denies any breast pain, swelling, discharge, lumps, or bumps. No know family history of breast cancer. The patient has a history of fibrocystic breasts. She does perform regular self breast exams as well as regular mammograms. No problems or complaints at this time.  HPI  Past Medical History  Diagnosis Date  . Anxiety   . Arthritis   . GERD (gastroesophageal reflux disease)   . HLD (hyperlipidemia)   . HTN (hypertension)     for < 10 years  . Rheumatic fever     as a child  . Hepatitis     as a child   . Heart murmur     ETT myoview 12/09 (Dr. Juel Burrow) no evidence of ischemia, good exercise tolerance - 9 min. echo (11/10): EF 60-65%, nml RV, PASP 25, nml valves  . OSA (obstructive sleep apnea)     mild; uses CPAP  . Obesity   . Diffuse cystic mastopathy 2012    Past Surgical History  Procedure Laterality Date  . Tonsillectomy    . Reconstruction foot and bunion surgery - both feet    . Carpael tunnel release      R hand  . Arthroscopic surgery      L elbow   . Colonoscopy  2008    Dr. Sharen Hint  . Total abdominal hysterectomy  1994    Family History  Problem Relation Age of Onset  . Diabetes Father   . Hypertension Father   . Other Father     PPM  . Multiple sclerosis Mother   . Hyperlipidemia Mother   . Aneurysm Brother     brain  . Heart attack Sister     LATE 30'S, 40  . Hypertension Sister   . Hyperlipidemia Sister     Social History History  Substance Use Topics  . Smoking status: Never Smoker   . Smokeless tobacco: Never Used     Comment: tobacco use - no  . Alcohol Use: Yes     Comment: social     No Known Allergies  Current Outpatient  Prescriptions  Medication Sig Dispense Refill  . aspirin 81 MG EC tablet Take 81 mg by mouth daily.        . cholecalciferol (VITAMIN D) 1000 UNITS tablet Take 2,000 Units by mouth daily.        . cloNIDine (CATAPRES) 0.1 MG tablet TAKE 1 TABLET BY MOUTH TWICE DAILY  60 tablet  7  . CRESTOR 20 MG tablet Take 1 tablet by mouth daily.      . hydrochlorothiazide (HYDRODIURIL) 50 MG tablet Take 1 tablet (50 mg total) by mouth daily.  30 tablet  5  . lisinopril (PRINIVIL,ZESTRIL) 40 MG tablet TAKE 1 TABLET BY MOUTH DAILY  30 tablet  6  . loratadine (CLARITIN) 10 MG tablet Take 10 mg by mouth as needed.       . pantoprazole (PROTONIX) 40 MG tablet Take 40 mg by mouth daily.         No current facility-administered medications for this visit.    Review of Systems Review of Systems  Constitutional: Negative.   Respiratory: Negative.  Cardiovascular: Negative.     Blood pressure 138/72, pulse 76, resp. rate 12, height 5\' 4"  (1.626 m), weight 214 lb (97.07 kg).  Physical Exam Physical Exam  Constitutional: She appears well-developed and well-nourished.  Eyes: Conjunctivae are normal. No scleral icterus.  Neck: Trachea normal. No mass and no thyromegaly present.  Cardiovascular: Normal rate, regular rhythm, normal heart sounds and normal pulses.   No murmur heard. Pulmonary/Chest: Effort normal and breath sounds normal. Right breast exhibits no inverted nipple, no mass, no nipple discharge, no skin change and no tenderness. Left breast exhibits no inverted nipple, no mass, no nipple discharge, no skin change and no tenderness. Breasts are asymmetrical.  Right breast is considerably smaller than left. This is an old finding.   Abdominal: Soft. Normal appearance and bowel sounds are normal. There is no hepatosplenomegaly. There is no tenderness. No hernia.  Lymphadenopathy:    She has no cervical adenopathy.    She has no axillary adenopathy.    Data Reviewed Mammogram reviewed shows no  new findings . Assessment    Stable exam    Plan    Followup 1 year with bilateral screening mammogram       Aengus Sauceda G 11/09/2012, 6:32 PM

## 2013-04-15 ENCOUNTER — Telehealth: Payer: Self-pay | Admitting: *Deleted

## 2013-04-15 NOTE — Telephone Encounter (Signed)
Pt was taken off HCTZ due to low k+ and sodium. 3 weeks post stopping both lab values are in acceptable range with no further lab draws ordered. Pt will have ekg/ labs faxed over from pcp. Pt wondering if she should do something further. Pt told to monitor bp/ avoid sodium Pt wants Dr Lenard Galloway made aware

## 2013-04-22 ENCOUNTER — Encounter: Payer: Self-pay | Admitting: Cardiovascular Disease

## 2013-04-22 ENCOUNTER — Ambulatory Visit (INDEPENDENT_AMBULATORY_CARE_PROVIDER_SITE_OTHER): Payer: Commercial Indemnity | Admitting: Cardiovascular Disease

## 2013-04-22 VITALS — BP 122/76 | HR 64 | Ht 64.0 in | Wt 212.2 lb

## 2013-04-22 DIAGNOSIS — E785 Hyperlipidemia, unspecified: Secondary | ICD-10-CM

## 2013-04-22 DIAGNOSIS — E669 Obesity, unspecified: Secondary | ICD-10-CM

## 2013-04-22 DIAGNOSIS — I10 Essential (primary) hypertension: Secondary | ICD-10-CM

## 2013-04-22 MED ORDER — ROSUVASTATIN CALCIUM 40 MG PO TABS: 40.0000 mg | ORAL_TABLET | Freq: Every day | ORAL | Status: DC

## 2013-04-22 MED ORDER — POTASSIUM CHLORIDE ER 10 MEQ PO TBCR: 10.0000 meq | EXTENDED_RELEASE_TABLET | Freq: Every day | ORAL | Status: DC

## 2013-04-22 MED ORDER — FUROSEMIDE 20 MG PO TABS: 20.0000 mg | ORAL_TABLET | Freq: Every day | ORAL | Status: DC | PRN

## 2013-04-22 NOTE — Assessment & Plan Note (Signed)
She is interested in decreasing the doses of her medications. Blood pressure seems to be very good on today's visit and at home. We have suggested she could try to cut her clonidine in half twice a day or lisinopril in half daily with close monitoring of her blood pressure. We'll try to keep her systolic pressure less than 135 on a regular basis

## 2013-04-22 NOTE — Progress Notes (Signed)
Patient ID: Erica Brock, female    DOB: December 20, 1950, 62 y.o.   MRN: [id number]  HPI Comments: 62 yo with history of HTN, hyperlipidemia, obesity, Obstructive sleep apnea who wears CPAP, and family history of CAD comes for followup.     she feels well with no significant symptoms of edema, shortness of breath or chest pain. Blood pressure at home has typically been well controlled with systolic pressures in the 130 range. She does have some cramping in her feet and occasionally takes extra potassium.  Recent blood work showed low sodium, low potassium. HCTZ was initially decreased down to 25 mg daily and stopped for abnormal blood work. Since then, lab abnormality has significantly improved. She reports blood pressure has consistently been 110-120 systolic Concerned as her sister recently had bypass surgery  Recent cholesterol 198, LDL 87, triglycerides 300, HDL 52, hemoglobin A1c 5.8 The patient has never smoked.     EKG shows normal sinus rhythm with no significant ST or T wave changes, left axis deviation      Outpatient Encounter Prescriptions as of 04/22/2013  Medication Sig Dispense Refill  . aspirin 81 MG EC tablet Take 81 mg by mouth daily.        . cloNIDine (CATAPRES) 0.1 MG tablet TAKE 1 TABLET BY MOUTH TWICE DAILY  60 tablet  7  . CRESTOR 20 MG tablet Take 1 tablet by mouth daily.      Marland Kitchen lisinopril (PRINIVIL,ZESTRIL) 40 MG tablet TAKE 1 TABLET BY MOUTH DAILY  30 tablet  6  . loratadine (CLARITIN) 10 MG tablet Take 10 mg by mouth as needed.       . pantoprazole (PROTONIX) 40 MG tablet Take 40 mg by mouth daily.        . hydrochlorothiazide (HYDRODIURIL) 50 MG tablet Take 1 tablet (50 mg total) by mouth daily.  30 tablet  5  . [DISCONTINUED] cholecalciferol (VITAMIN D) 1000 UNITS tablet Take 2,000 Units by mouth daily.         No facility-administered encounter medications on file as of 04/22/2013.    Review of Systems  Constitutional: Negative.   HENT: Negative.   Eyes:  Negative.   Respiratory: Negative.   Cardiovascular: Negative.   Gastrointestinal: Negative.   Musculoskeletal: Negative.   Skin: Negative.   Psychiatric/Behavioral: Negative.        Poor sleep  All other systems reviewed and are negative.    BP 122/76  Pulse 64  Ht 5\' 4"  (1.626 m)  Wt 212 lb 4 oz (96.276 kg)  BMI 36.41 kg/m2  Physical Exam  Nursing note and vitals reviewed. Constitutional: She is oriented to person, place, and time. She appears well-developed and well-nourished.  Obese  HENT:  Head: Normocephalic.  Nose: Nose normal.  Mouth/Throat: Oropharynx is clear and moist.  Eyes: Conjunctivae are normal. Pupils are equal, round, and reactive to light.  Neck: Normal range of motion. Neck supple. No JVD present.  Cardiovascular: Normal rate, regular rhythm, S1 normal, S2 normal, normal heart sounds and intact distal pulses.  Exam reveals no gallop and no friction rub.   No murmur heard. Pulmonary/Chest: Effort normal and breath sounds normal. No respiratory distress. She has no wheezes. She has no rales. She exhibits no tenderness.  Abdominal: Soft. Bowel sounds are normal. She exhibits no distension. There is no tenderness.  Musculoskeletal: Normal range of motion. She exhibits no edema and no tenderness.  Lymphadenopathy:    She has no cervical adenopathy.  Neurological: She is  alert and oriented to person, place, and time. Coordination normal.  Skin: Skin is warm and dry. No rash noted. No erythema.  Psychiatric: She has a normal mood and affect. Her behavior is normal. Judgment and thought content normal.    Assessment and Plan

## 2013-04-22 NOTE — Patient Instructions (Addendum)
You are doing well.  Please try to increase crestor to 40 mg alternating with 20 mg every other day  Take lasix sparingly with potassium for swelling Ok to wean clonidine slowly if blood pressure stays in a good range  Please call us if you have new issues that need to be addressed before your next appt.  Your physician wants you to follow-up in: 12 months.  You will receive a reminder letter in the mail two months in advance. If you don't receive a letter, please call our office to schedule the follow-up appointment.

## 2013-04-22 NOTE — Assessment & Plan Note (Signed)
We have encouraged continued exercise, careful diet management in an effort to lose weight. 

## 2013-04-22 NOTE — Assessment & Plan Note (Signed)
She does have a very strong family history of CAD. Sr. recently had bypass surgery. She is very concerned. We did suggest she could alternate Crestor 40 mg with 20 mg, could consider adding fish oil or flaxseed oil for elevated triglycerides

## 2013-05-16 ENCOUNTER — Encounter: Payer: Self-pay | Admitting: *Deleted

## 2013-05-26 ENCOUNTER — Other Ambulatory Visit: Payer: Self-pay

## 2013-06-09 ENCOUNTER — Encounter: Payer: Self-pay | Admitting: Cardiovascular Disease

## 2013-06-09 ENCOUNTER — Ambulatory Visit (INDEPENDENT_AMBULATORY_CARE_PROVIDER_SITE_OTHER): Payer: Commercial Indemnity | Admitting: Cardiovascular Disease

## 2013-06-09 VITALS — BP 140/90 | HR 62 | Ht 64.0 in | Wt 207.8 lb

## 2013-06-09 DIAGNOSIS — R079 Chest pain, unspecified: Secondary | ICD-10-CM

## 2013-06-09 DIAGNOSIS — E785 Hyperlipidemia, unspecified: Secondary | ICD-10-CM

## 2013-06-09 DIAGNOSIS — E669 Obesity, unspecified: Secondary | ICD-10-CM

## 2013-06-09 DIAGNOSIS — G4733 Obstructive sleep apnea (adult) (pediatric): Secondary | ICD-10-CM

## 2013-06-09 DIAGNOSIS — I10 Essential (primary) hypertension: Secondary | ICD-10-CM

## 2013-06-09 MED ORDER — ISOSORBIDE DINITRATE 10 MG PO TABS: 10.0000 mg | ORAL_TABLET | Freq: Three times a day (TID) | ORAL | Status: DC | PRN

## 2013-06-09 NOTE — Assessment & Plan Note (Signed)
Encouraged her to stay on her Crestor 

## 2013-06-09 NOTE — Assessment & Plan Note (Signed)
For elevated blood pressure, we have suggested that she increase her clonidine initially up to 1-1/2 of her 0.1 mg clonidine pills twice a day. If no improvement in her blood pressure, with increased to 0.2 mg twice a day. If she starts to have side effects, hydralazine could be used 3 times a day. We will also start isosorbide dinitrate to take as needed for chest tightness/spasm

## 2013-06-09 NOTE — Assessment & Plan Note (Addendum)
Atypical in nature though concerning given her family history. We'll prescribe isosorbide dinitrate to take as needed for symptoms. If she continues to have symptoms, we would need a stress test

## 2013-06-09 NOTE — Progress Notes (Signed)
Patient ID: Erica Brock, female    DOB: 1950/09/17, 62 y.o.   MRN: [id number]  HPI Comments: 62 yo with history of HTN, hyperlipidemia, obesity, Obstructive sleep apnea who wears CPAP, and family history of CAD comes for followup.     she feels well with no significant symptoms of edema, shortness of breath or chest pain. She is concerned as her blood pressure has been elevated over the past several weeks. She has been taking a herbal life shake and taking a pill daily for weight loss. Recently had cortisone shot in her knee which has increased her sugar levels. Occasionally she has chest discomfort which she attributes to spasm. Sometimes pressures in the 170-180 range. No less than high 130 range systolic   Previous blood work blood work showed low sodium, low potassium on HCTZ. This was discontinued  Concerned as her sister has had bypass surgery  Recent cholesterol 198, LDL 87, triglycerides 300, HDL 52, hemoglobin A1c 5.8 The patient has never smoked.       Outpatient Encounter Prescriptions as of 06/09/2013  Medication Sig  . aspirin 81 MG EC tablet Take 81 mg by mouth daily.    . cloNIDine (CATAPRES) 0.1 MG tablet TAKE 1 TABLET BY MOUTH TWICE DAILY  . furosemide (LASIX) 20 MG tablet Take 1 tablet (20 mg total) by mouth daily as needed.  Marland Kitchen lisinopril (PRINIVIL,ZESTRIL) 40 MG tablet TAKE 1 TABLET BY MOUTH DAILY  . loratadine (CLARITIN) 10 MG tablet Take 10 mg by mouth as needed.   . pantoprazole (PROTONIX) 40 MG tablet Take 40 mg by mouth daily.    . potassium chloride (K-DUR) 10 MEQ tablet Take 1 tablet (10 mEq total) by mouth daily.  . rosuvastatin (CRESTOR) 40 MG tablet Take 1 tablet (40 mg total) by mouth daily.    Review of Systems  Constitutional: Negative.   HENT: Negative.   Eyes: Negative.   Respiratory: Negative.   Cardiovascular: Negative.   Gastrointestinal: Negative.   Endocrine: Negative.   Musculoskeletal: Negative.   Skin: Negative.   Allergic/Immunologic:  Negative.   Neurological: Negative.   Hematological: Negative.   Psychiatric/Behavioral: Negative.        Poor sleep  All other systems reviewed and are negative.    BP 140/90  Pulse 62  Ht 5\' 4"  (1.626 m)  Wt 207 lb 12 oz (94.235 kg)  BMI 35.64 kg/m2  Physical Exam  Nursing note and vitals reviewed. Constitutional: She is oriented to person, place, and time. She appears well-developed and well-nourished.  Obese  HENT:  Head: Normocephalic.  Nose: Nose normal.  Mouth/Throat: Oropharynx is clear and moist.  Eyes: Conjunctivae are normal. Pupils are equal, round, and reactive to light.  Neck: Normal range of motion. Neck supple. No JVD present.  Cardiovascular: Normal rate, regular rhythm, S1 normal, S2 normal, normal heart sounds and intact distal pulses.  Exam reveals no gallop and no friction rub.   No murmur heard. Pulmonary/Chest: Effort normal and breath sounds normal. No respiratory distress. She has no wheezes. She has no rales. She exhibits no tenderness.  Abdominal: Soft. Bowel sounds are normal. She exhibits no distension. There is no tenderness.  Musculoskeletal: Normal range of motion. She exhibits no edema and no tenderness.  Lymphadenopathy:    She has no cervical adenopathy.  Neurological: She is alert and oriented to person, place, and time. Coordination normal.  Skin: Skin is warm and dry. No rash noted. No erythema.  Psychiatric: She has a normal mood  and affect. Her behavior is normal. Judgment and thought content normal.    Assessment and Plan       a

## 2013-06-09 NOTE — Assessment & Plan Note (Signed)
She is relatively sedentary. No longer working. Takes care of grandchildren. Encouraged an exercise program, weight loss

## 2013-06-09 NOTE — Assessment & Plan Note (Signed)
She's not sleep well in general. Encouraged her to stay on her CPAP

## 2013-06-09 NOTE — Patient Instructions (Addendum)
Please increase the clonidine to 0.1 mg three times a day (or 1 1/2 pills two times a day) Next step would be clonidine 0.2 mg twice a day  Stay on the lisinopril  40 mg daily  Take isosorbide dinitrate as needed for high blood pressure or chest tightness  Please call us if you have new issues that need to be addressed before your next appt.  Your physician wants you to follow-up in: 6 months.  You will receive a reminder letter in the mail two months in advance. If you don't receive a letter, please call our office to schedule the follow-up appointment.

## 2013-07-20 ENCOUNTER — Emergency Department: Payer: Self-pay | Admitting: Emergency Medicine

## 2013-07-21 LAB — URINALYSIS, COMPLETE
BILIRUBIN, UR: NEGATIVE
Bacteria: NONE SEEN
Blood: NEGATIVE
GLUCOSE, UR: NEGATIVE mg/dL (ref 0–75)
Ketone: NEGATIVE
Leukocyte Esterase: NEGATIVE
Nitrite: NEGATIVE
Ph: 7 (ref 4.5–8.0)
Protein: NEGATIVE
Specific Gravity: 1.006 (ref 1.003–1.030)
Squamous Epithelial: 1

## 2013-07-21 LAB — COMPREHENSIVE METABOLIC PANEL
Albumin: 4 g/dL (ref 3.4–5.0)
Alkaline Phosphatase: 66 U/L
Anion Gap: 7 (ref 7–16)
BUN: 18 mg/dL (ref 7–18)
Bilirubin,Total: 0.6 mg/dL (ref 0.2–1.0)
Calcium, Total: 9.6 mg/dL (ref 8.5–10.1)
Chloride: 98 mmol/L (ref 98–107)
Co2: 26 mmol/L (ref 21–32)
Creatinine: 0.67 mg/dL (ref 0.60–1.30)
EGFR (African American): >60
EGFR (Non-African Amer.): >60
Glucose: 134 mg/dL — ABNORMAL HIGH (ref 65–99)
Osmolality: 267 (ref 275–301)
Potassium: 4.1 mmol/L (ref 3.5–5.1)
SGOT(AST): 31 U/L (ref 15–37)
SGPT (ALT): 29 U/L (ref 12–78)
Sodium: 131 mmol/L — ABNORMAL LOW (ref 136–145)
Total Protein: 7.8 g/dL (ref 6.4–8.2)

## 2013-07-21 LAB — CBC
HCT: 36 % (ref 35.0–47.0)
HGB: 12.3 g/dL (ref 12.0–16.0)
MCH: 31.3 pg (ref 26.0–34.0)
MCHC: 34.2 g/dL (ref 32.0–36.0)
MCV: 91 fL (ref 80–100)
Platelet: 210 10*3/uL (ref 150–440)
RBC: 3.94 10*6/uL (ref 3.80–5.20)
RDW: 14.2 % (ref 11.5–14.5)
WBC: 9.8 10*3/uL (ref 3.6–11.0)

## 2013-07-21 LAB — CK TOTAL AND CKMB (NOT AT ARMC)
CK, Total: 189 U/L (ref 21–215)
CK-MB: 1.6 ng/mL (ref 0.5–3.6)

## 2013-07-21 LAB — TROPONIN I: Troponin-I: <0.02 ng/mL

## 2013-07-22 ENCOUNTER — Encounter: Payer: Self-pay | Admitting: Cardiovascular Disease

## 2013-07-22 ENCOUNTER — Ambulatory Visit (INDEPENDENT_AMBULATORY_CARE_PROVIDER_SITE_OTHER): Payer: Commercial Indemnity | Admitting: Cardiovascular Disease

## 2013-07-22 VITALS — BP 138/89 | HR 58 | Ht 64.0 in | Wt 208.2 lb

## 2013-07-22 DIAGNOSIS — E785 Hyperlipidemia, unspecified: Secondary | ICD-10-CM

## 2013-07-22 DIAGNOSIS — I10 Essential (primary) hypertension: Secondary | ICD-10-CM

## 2013-07-22 DIAGNOSIS — R51 Headache: Secondary | ICD-10-CM

## 2013-07-22 DIAGNOSIS — E669 Obesity, unspecified: Secondary | ICD-10-CM

## 2013-07-22 DIAGNOSIS — R519 Headache, unspecified: Secondary | ICD-10-CM

## 2013-07-22 DIAGNOSIS — R079 Chest pain, unspecified: Secondary | ICD-10-CM

## 2013-07-22 MED ORDER — HYDRALAZINE HCL 50 MG PO TABS: 50.0000 mg | ORAL_TABLET | Freq: Three times a day (TID) | ORAL | Status: DC | PRN

## 2013-07-22 MED ORDER — CLONIDINE HCL 0.2 MG PO TABS: 0.2000 mg | ORAL_TABLET | Freq: Two times a day (BID) | ORAL | Status: DC

## 2013-07-22 NOTE — Assessment & Plan Note (Signed)
We have encouraged continued exercise, careful diet management in an effort to lose weight. 

## 2013-07-22 NOTE — Patient Instructions (Addendum)
  Please increase the clonidine to 0.2 mg twice a day For high blood pressures, take hydralazine 1/2 or whole pill as needed Hydralazine can be taken up to 4 times a day (6 hour pill)  Please call us if you have new issues that need to be addressed before your next appt.  Your physician wants you to follow-up in: 6 months.  You will receive a reminder letter in the mail two months in advance. If you don't receive a letter, please call our office to schedule the follow-up appointment.

## 2013-07-22 NOTE — Assessment & Plan Note (Signed)
We have suggested she increase her clonidine to 0.2 mg twice a day. Uncertain if isosorbide dinitrate is giving her a headache. We will hold the isosorbide and start hydralazine 25-50 mg when necessary for systolic pressure more than 160.

## 2013-07-22 NOTE — Assessment & Plan Note (Signed)
Suggested she continue on her statin

## 2013-07-22 NOTE — Progress Notes (Signed)
Patient ID: Erica Brock, female    DOB: 1950/11/20, 62 y.o.   MRN: [id number]  HPI Comments: 63 yo with history of HTN, hyperlipidemia, obesity, Obstructive sleep apnea who wears CPAP, and family history of CAD comes for followup.     In followup today, she reports that her blood pressure has been running high. She has noticed higher pressures over the past week or so of uncertain etiology. She had leg edema 07/19/2013 and took Lasix. December 31 had high blood pressure, neck pain, headache. She went to the emergency room and reports systolic pressure was more than 200. She was given extra clonidine. She went home and slept on new years day. Has continued to have high blood pressure. Over the past several days has taken several isosorbide dinitrate  Pills.  no significant symptoms of  shortness of breath or chest pain.  Previous blood work blood work showed low sodium, low potassium on HCTZ. This was discontinued   sister has had bypass surgery  Recent cholesterol 198, LDL 87, triglycerides 300, HDL 52, hemoglobin A1c 5.8 The patient has never smoked.   Lab work from the emergency room 07/20/2013 was essentially normal. Cardiac enzymes negative, CBC basic metabolic panel normal Chest x-ray normal EKG shows normal sinus rhythm with rate 58 beats per minute, no significant ST or T wave changes     Outpatient Encounter Prescriptions as of 07/22/2013  Medication Sig  . aspirin 81 MG EC tablet Take 81 mg by mouth daily.    . cloNIDine (CATAPRES) 0.1 MG tablet TAKE 1 TABLET BY MOUTH TWICE DAILY  . furosemide (LASIX) 20 MG tablet Take 1 tablet (20 mg total) by mouth daily as needed.  . isosorbide dinitrate (ISORDIL) 10 MG tablet Take 10 mg by mouth 3 (three) times daily. PRN  . lisinopril (PRINIVIL,ZESTRIL) 40 MG tablet TAKE 1 TABLET BY MOUTH DAILY  . loratadine (CLARITIN) 10 MG tablet Take 10 mg by mouth as needed.   . pantoprazole (PROTONIX) 40 MG tablet Take 40 mg by mouth daily.    .  potassium chloride (K-DUR) 10 MEQ tablet Take 1 tablet (10 mEq total) by mouth daily.  . rosuvastatin (CRESTOR) 40 MG tablet Take 1 tablet (40 mg total) by mouth daily.    Review of Systems  Constitutional: Negative.   HENT: Negative.   Eyes: Negative.   Respiratory: Negative.   Cardiovascular: Negative.   Gastrointestinal: Negative.   Endocrine: Negative.   Musculoskeletal: Negative.   Skin: Negative.   Allergic/Immunologic: Negative.   Neurological: Negative.   Hematological: Negative.   Psychiatric/Behavioral: Negative.        Poor sleep  All other systems reviewed and are negative.    BP 138/89  Pulse 58  Ht 5\' 4"  (1.626 m)  Wt 208 lb 4 oz (94.462 kg)  BMI 35.73 kg/m2  Physical Exam  Nursing note and vitals reviewed. Constitutional: She is oriented to person, place, and time. She appears well-developed and well-nourished.  Obese  HENT:  Head: Normocephalic.  Nose: Nose normal.  Mouth/Throat: Oropharynx is clear and moist.  Eyes: Conjunctivae are normal. Pupils are equal, round, and reactive to light.  Neck: Normal range of motion. Neck supple. No JVD present.  Cardiovascular: Normal rate, regular rhythm, S1 normal, S2 normal, normal heart sounds and intact distal pulses.  Exam reveals no gallop and no friction rub.   No murmur heard. Pulmonary/Chest: Effort normal and breath sounds normal. No respiratory distress. She has no wheezes. She has no rales.  She exhibits no tenderness.  Abdominal: Soft. Bowel sounds are normal. She exhibits no distension. There is no tenderness.  Musculoskeletal: Normal range of motion. She exhibits no edema and no tenderness.  Lymphadenopathy:    She has no cervical adenopathy.  Neurological: She is alert and oriented to person, place, and time. Coordination normal.  Skin: Skin is warm and dry. No rash noted. No erythema.  Psychiatric: She has a normal mood and affect. Her behavior is normal. Judgment and thought content normal.     Assessment and Plan       a

## 2013-07-22 NOTE — Assessment & Plan Note (Signed)
She does report a history of headaches. Worse recently also with neck pain. Suggested we hold the isosorbide, increase clonidine, use hydralazine instead for breakthrough hypertension

## 2013-07-26 ENCOUNTER — Encounter: Payer: Self-pay | Admitting: Cardiovascular Disease

## 2013-07-26 ENCOUNTER — Other Ambulatory Visit: Payer: Self-pay | Admitting: *Deleted

## 2013-07-26 MED ORDER — HYDRALAZINE HCL 50 MG PO TABS: 75.0000 mg | ORAL_TABLET | Freq: Three times a day (TID) | ORAL | Status: DC

## 2013-07-26 NOTE — Telephone Encounter (Signed)
Spoke w/ Dr. Kirke Corin regarding pt's BP readings. He advised pt to increase hydralazine to 1.5 tablets (75 mg total) by mouth 3 (three) times daily. Pt verbalizes understanding and is agreeable to this.

## 2013-07-27 ENCOUNTER — Telehealth: Payer: Self-pay

## 2013-07-27 DIAGNOSIS — I10 Essential (primary) hypertension: Secondary | ICD-10-CM

## 2013-07-27 MED ORDER — HYDRALAZINE HCL 50 MG PO TABS: 50.0000 mg | ORAL_TABLET | Freq: Three times a day (TID) | ORAL | Status: DC

## 2013-07-27 NOTE — Telephone Encounter (Signed)
Spoke w/ pt.  She is questioning increase in her hydralazine that was made yesterday per MyChart email. Pt was advised by Dr. Kirke Corin to increase hydralazine 50mg  TID to 75mg  TID. Pt reports that she has not been taking her hydralazine at all, as she reports that she "thought it was for emergencies only if my blood pressure was high enough to go to the ER." Had lengthy discussion w/ pt about BP and when to take.  Instructed her to take hydralazine 50mg  TID prn for systolic pressures >150. She is agreeable to this.   Pt also reports that her BG has been >100 fasting which is a new occurrence.  Pt reports that she has been diagnosed w/ prediabetes and her fasting BG is usually between 125-140. Advised pt to speak w/ PCP about possible referral to [facility] for diabetes education. She reports that she previously had an appt, but did not keep it due to the cost.   Advised that she reschedule this. Advised pt that I would print off info from the office for all of her diagnoses and meds for her to review. Pt verbalizes understanding of new med instructions and is agreeable to plan.

## 2013-08-17 ENCOUNTER — Encounter: Payer: Self-pay | Admitting: Cardiovascular Disease

## 2013-08-31 ENCOUNTER — Other Ambulatory Visit: Payer: Self-pay | Admitting: *Deleted

## 2013-08-31 MED ORDER — LISINOPRIL 40 MG PO TABS: ORAL_TABLET | ORAL | Status: DC

## 2013-08-31 MED ORDER — CLONIDINE HCL 0.2 MG PO TABS: 0.2000 mg | ORAL_TABLET | Freq: Two times a day (BID) | ORAL | Status: DC

## 2013-08-31 NOTE — Telephone Encounter (Signed)
Requested Prescriptions   Signed Prescriptions Disp Refills  . cloNIDine (CATAPRES) 0.2 MG tablet 180 tablet 3    Sig: Take 1 tablet (0.2 mg total) by mouth 2 (two) times daily.    Authorizing Provider: Antonieta Iba    Ordering User: Shawnie Dapper, Hammad Finkler C  . lisinopril (PRINIVIL,ZESTRIL) 40 MG tablet 90 tablet 3    Sig: TAKE 1 TABLET BY MOUTH DAILY    Authorizing Provider: Antonieta Iba    Ordering User: Kendrick Fries

## 2013-10-31 ENCOUNTER — Ambulatory Visit: Payer: Self-pay | Admitting: General Surgery

## 2013-11-01 ENCOUNTER — Encounter: Payer: Self-pay | Admitting: General Surgery

## 2013-11-08 ENCOUNTER — Ambulatory Visit: Payer: Managed Care, Other (non HMO) | Admitting: General Surgery

## 2013-11-21 ENCOUNTER — Ambulatory Visit: Payer: Managed Care, Other (non HMO) | Admitting: General Surgery

## 2013-12-07 ENCOUNTER — Encounter: Payer: Self-pay | Admitting: *Deleted

## 2013-12-12 ENCOUNTER — Ambulatory Visit: Payer: Self-pay | Admitting: Physician Assistant

## 2013-12-12 LAB — URINALYSIS, COMPLETE
BLOOD: NEGATIVE
Bilirubin,UR: NEGATIVE
Glucose,UR: NEGATIVE mg/dL (ref 0–75)
KETONE: NEGATIVE
Nitrite: NEGATIVE
Ph: 6.5 (ref 4.5–8.0)
Specific Gravity: 1.015 (ref 1.003–1.030)

## 2013-12-14 LAB — URINE CULTURE

## 2014-01-18 ENCOUNTER — Ambulatory Visit (INDEPENDENT_AMBULATORY_CARE_PROVIDER_SITE_OTHER): Payer: Commercial Indemnity | Admitting: Cardiovascular Disease

## 2014-01-18 ENCOUNTER — Encounter: Payer: Self-pay | Admitting: Cardiovascular Disease

## 2014-01-18 VITALS — BP 142/96 | HR 71 | Ht 64.0 in | Wt 220.5 lb

## 2014-01-18 DIAGNOSIS — I10 Essential (primary) hypertension: Secondary | ICD-10-CM

## 2014-01-18 DIAGNOSIS — E785 Hyperlipidemia, unspecified: Secondary | ICD-10-CM

## 2014-01-18 DIAGNOSIS — E669 Obesity, unspecified: Secondary | ICD-10-CM

## 2014-01-18 DIAGNOSIS — R0602 Shortness of breath: Secondary | ICD-10-CM

## 2014-01-18 MED ORDER — SIMVASTATIN 20 MG PO TABS: 20.0000 mg | ORAL_TABLET | Freq: Every day | ORAL | Status: DC

## 2014-01-18 NOTE — Patient Instructions (Addendum)
You are doing well. Please start simvastatin 1/2 pill for two weeks Then increase to a full pill  Ask pharmacy the cost of zetia  Monitor your blood pressure at home  Please call us if you have new issues that need to be addressed before your next appt.  Your physician wants you to follow-up in: 6 months.  You will receive a reminder letter in the mail two months in advance. If you don't receive a letter, please call our office to schedule the follow-up appointment.

## 2014-01-18 NOTE — Assessment & Plan Note (Signed)
We have suggested she closely monitor her blood pressure at home. Recommended she call us for systolic pressures more than 140 on a regular basis, greater than 90 diastolic

## 2014-01-18 NOTE — Assessment & Plan Note (Signed)
She was unable to tolerate Lipitor. We will start simvastatin 10 mg with titration up to 20 mg daily if tolerated. Suggested she check the Price of zetia.

## 2014-01-18 NOTE — Progress Notes (Signed)
Patient ID: Erica Brock, female    DOB: May 13, 1951, 63 y.o.   MRN: [id number]  HPI Comments: 63 yo with history of HTN, hyperlipidemia, obesity, Obstructive sleep apnea who wears CPAP, and family history of CAD comes for followup.    In followup today, she reports that she had significant muscle weakness on the Lipitor 20 mg. She has stopped this in April 2015. She also stopped isosorbide as this was causing a headache. She takes hydralazine on a when necessary. Takes clonidine twice a day, lisinopril daily, Lasix with potassium when necessary.no significant symptoms of  shortness of breath or chest pain. She reports blood pressure has been reasonably well controlled at home.    She had leg edema 07/19/2013 and took Lasix. December 31 had high blood pressure, neck pain, headache. She went to the emergency room and reports systolic pressure was more than 200. She was given extra clonidine. She went home and slept on new years day.   Previous blood work blood work showed low sodium, low potassium on HCTZ. This was discontinued   sister has had bypass surgery  Previous cholesterol 198, LDL 87, triglycerides 300, HDL 52, hemoglobin A1c 5.8 The patient has never smoked.   Lab work from the emergency room 07/20/2013 was essentially normal. Cardiac enzymes negative, CBC basic metabolic panel normal Chest x-ray normal EKG shows normal sinus rhythm with rate 71 beats per minute, no significant ST or T wave changes     Outpatient Encounter Prescriptions as of 01/18/2014  Medication Sig  . aspirin 81 MG EC tablet Take 81 mg by mouth daily.    . cloNIDine (CATAPRES) 0.2 MG tablet Take 1 tablet (0.2 mg total) by mouth 2 (two) times daily.  . furosemide (LASIX) 20 MG tablet Take 1 tablet (20 mg total) by mouth daily as needed.  . hydrALAZINE (APRESOLINE) 50 MG tablet Take 1 tablet (50 mg total) by mouth 3 (three) times daily.  Marland Kitchen lisinopril (PRINIVIL,ZESTRIL) 40 MG tablet TAKE 1 TABLET BY MOUTH DAILY   . loratadine (CLARITIN) 10 MG tablet Take 10 mg by mouth as needed.   . pantoprazole (PROTONIX) 40 MG tablet Take 40 mg by mouth daily.    . potassium chloride (K-DUR) 10 MEQ tablet Take 1 tablet (10 mEq total) by mouth daily.  . [DISCONTINUED] isosorbide dinitrate (ISORDIL) 10 MG tablet Take 10 mg by mouth 3 (three) times daily.  . [DISCONTINUED] rosuvastatin (CRESTOR) 40 MG tablet Take 1 tablet (40 mg total) by mouth daily.     Review of Systems  Constitutional: Negative.   HENT: Negative.   Eyes: Negative.   Respiratory: Negative.   Cardiovascular: Negative.   Gastrointestinal: Negative.   Endocrine: Negative.   Musculoskeletal: Negative.   Skin: Negative.   Allergic/Immunologic: Negative.   Neurological: Negative.   Hematological: Negative.   Psychiatric/Behavioral: Negative.        Poor sleep  All other systems reviewed and are negative.   BP 142/96  Pulse 71  Ht 5\' 4"  (1.626 m)  Wt 220 lb 8 oz (100.018 kg)  BMI 37.83 kg/m2  Physical Exam  Nursing note and vitals reviewed. Constitutional: She is oriented to person, place, and time. She appears well-developed and well-nourished.  Obese  HENT:  Head: Normocephalic.  Nose: Nose normal.  Mouth/Throat: Oropharynx is clear and moist.  Eyes: Conjunctivae are normal. Pupils are equal, round, and reactive to light.  Neck: Normal range of motion. Neck supple. No JVD present.  Cardiovascular: Normal rate, regular  rhythm, S1 normal, S2 normal, normal heart sounds and intact distal pulses.  Exam reveals no gallop and no friction rub.   No murmur heard. Pulmonary/Chest: Effort normal and breath sounds normal. No respiratory distress. She has no wheezes. She has no rales. She exhibits no tenderness.  Abdominal: Soft. Bowel sounds are normal. She exhibits no distension. There is no tenderness.  Musculoskeletal: Normal range of motion. She exhibits no edema and no tenderness.  Lymphadenopathy:    She has no cervical adenopathy.   Neurological: She is alert and oriented to person, place, and time. Coordination normal.  Skin: Skin is warm and dry. No rash noted. No erythema.  Psychiatric: She has a normal mood and affect. Her behavior is normal. Judgment and thought content normal.    Assessment and Plan

## 2014-01-18 NOTE — Assessment & Plan Note (Signed)
We have encouraged continued exercise, careful diet management in an effort to lose weight. 

## 2014-01-25 ENCOUNTER — Other Ambulatory Visit: Payer: Self-pay

## 2014-01-25 DIAGNOSIS — R0602 Shortness of breath: Secondary | ICD-10-CM

## 2014-01-25 DIAGNOSIS — I10 Essential (primary) hypertension: Secondary | ICD-10-CM

## 2014-01-25 DIAGNOSIS — E785 Hyperlipidemia, unspecified: Secondary | ICD-10-CM

## 2014-04-10 ENCOUNTER — Encounter: Payer: Self-pay | Admitting: Cardiovascular Disease

## 2014-04-26 ENCOUNTER — Other Ambulatory Visit: Payer: Self-pay

## 2014-04-26 MED ORDER — EZETIMIBE 10 MG PO TABS: 10.0000 mg | ORAL_TABLET | Freq: Every day | ORAL | Status: DC

## 2014-05-22 ENCOUNTER — Encounter: Payer: Self-pay | Admitting: Cardiovascular Disease

## 2014-06-30 ENCOUNTER — Other Ambulatory Visit: Payer: Self-pay

## 2014-06-30 MED ORDER — CLONIDINE HCL 0.2 MG PO TABS: 0.2000 mg | ORAL_TABLET | Freq: Two times a day (BID) | ORAL | Status: DC

## 2014-06-30 NOTE — Telephone Encounter (Signed)
Refill sent for clonidine 0.2 mg

## 2014-07-04 ENCOUNTER — Other Ambulatory Visit: Payer: Self-pay

## 2014-07-04 ENCOUNTER — Telehealth: Payer: Self-pay

## 2014-07-04 MED ORDER — LISINOPRIL 40 MG PO TABS: ORAL_TABLET | ORAL | Status: DC

## 2014-07-04 NOTE — Telephone Encounter (Signed)
Refill sent for lisinopril  

## 2014-07-04 NOTE — Telephone Encounter (Signed)
Patient is taking the lisinopril

## 2014-07-04 NOTE — Telephone Encounter (Signed)
Please call regarding if you are needing the brand name prinivil.

## 2014-07-24 ENCOUNTER — Ambulatory Visit: Payer: Commercial Indemnity | Admitting: Cardiovascular Disease

## 2014-08-08 ENCOUNTER — Ambulatory Visit (INDEPENDENT_AMBULATORY_CARE_PROVIDER_SITE_OTHER): Payer: Commercial Indemnity | Admitting: Cardiovascular Disease

## 2014-08-08 ENCOUNTER — Encounter: Payer: Self-pay | Admitting: Cardiovascular Disease

## 2014-08-08 VITALS — BP 128/78 | HR 72 | Ht 64.0 in | Wt 222.8 lb

## 2014-08-08 DIAGNOSIS — E669 Obesity, unspecified: Secondary | ICD-10-CM

## 2014-08-08 DIAGNOSIS — E785 Hyperlipidemia, unspecified: Secondary | ICD-10-CM

## 2014-08-08 DIAGNOSIS — I10 Essential (primary) hypertension: Secondary | ICD-10-CM

## 2014-08-08 MED ORDER — FUROSEMIDE 20 MG PO TABS: 20.0000 mg | ORAL_TABLET | Freq: Every day | ORAL | Status: DC | PRN

## 2014-08-08 NOTE — Assessment & Plan Note (Signed)
We have encouraged continued exercise, careful diet management in an effort to lose weight. 

## 2014-08-08 NOTE — Patient Instructions (Signed)
You are doing well. No medication changes were made.  Continue your current medications  Please call us if you have new issues that need to be addressed before your next appt.  Your physician wants you to follow-up in: 12 months.  You will receive a reminder letter in the mail two months in advance. If you don't receive a letter, please call our office to schedule the follow-up appointment.

## 2014-08-08 NOTE — Assessment & Plan Note (Signed)
Tolerating simvastatin and zetia. She has lab work next week

## 2014-08-08 NOTE — Progress Notes (Signed)
Patient ID: Erica Brock, female    DOB: Sep 22, 1950, 64 y.o.   MRN: [id number]  HPI Comments: 64 yo with history of HTN, hyperlipidemia, obesity, Obstructive sleep apnea who wears CPAP, and family history of CAD comes for followup of her hyperlipidemia  In follow-up today, she is tolerating simvastatin 20 g daily, zetia 10 mg daily. Reports being on prednisone in the recent past for leg discomfort. Since this her myalgias from the simvastatin has resolved Trying to watch her weight, does not do any regular exercise. Reports her blood pressure has been well-controlled. Not taking Lasix, no lower exterminate edema  EKG on today's visit shows normal sinus rhythm with rate 72 bpm, no significant ST or T-wave changes   Other past medical history  She had leg edema 07/19/2013 and took Lasix. December 31 had high blood pressure, neck pain, headache. She went to the emergency room and reports systolic pressure was more than 200. She was given extra clonidine. She went home and slept on new years day.   Previous blood work blood work showed low sodium, low potassium on HCTZ. This was discontinued   sister has had bypass surgery  Previous cholesterol 198, LDL 87, triglycerides 300, HDL 52, hemoglobin A1c 5.8 The patient has never smoked.   Lab work from the emergency room 07/20/2013 was essentially normal. Cardiac enzymes negative, CBC basic metabolic panel normal Chest x-ray normal     Allergies  Allergen Reactions  . Atorvastatin     Muscle aches     Outpatient Encounter Prescriptions as of 08/08/2014  Medication Sig  . aspirin 81 MG EC tablet Take 81 mg by mouth daily.    . cloNIDine (CATAPRES) 0.2 MG tablet Take 1 tablet (0.2 mg total) by mouth 2 (two) times daily.  Marland Kitchen ezetimibe (ZETIA) 10 MG tablet Take 1 tablet (10 mg total) by mouth daily.  . furosemide (LASIX) 20 MG tablet Take 1 tablet (20 mg total) by mouth daily as needed.  . hydrALAZINE (APRESOLINE) 50 MG tablet Take 1 tablet  (50 mg total) by mouth 3 (three) times daily.  Marland Kitchen lisinopril (PRINIVIL,ZESTRIL) 40 MG tablet TAKE 1 TABLET BY MOUTH DAILY  . loratadine (CLARITIN) 10 MG tablet Take 10 mg by mouth as needed.   . pantoprazole (PROTONIX) 40 MG tablet Take 40 mg by mouth daily.    . potassium chloride (K-DUR) 10 MEQ tablet Take 1 tablet (10 mEq total) by mouth daily. (Patient taking differently: Take 10 mEq by mouth daily as needed. )  . simvastatin (ZOCOR) 20 MG tablet Take 1 tablet (20 mg total) by mouth at bedtime.    Past Medical History  Diagnosis Date  . Anxiety   . Arthritis   . GERD (gastroesophageal reflux disease)   . HLD (hyperlipidemia)   . HTN (hypertension)     for < 10 years  . Rheumatic fever     as a child  . Hepatitis     as a child   . Heart murmur     ETT myoview 12/09 (Dr. Juel Burrow) no evidence of ischemia, good exercise tolerance - 9 min. echo (11/10): EF 60-65%, nml RV, PASP 25, nml valves  . OSA (obstructive sleep apnea)     mild; uses CPAP  . Obesity   . Diffuse cystic mastopathy 2012    Past Surgical History  Procedure Laterality Date  . Tonsillectomy    . Reconstruction foot and bunion surgery - both feet    . Carpael tunnel release  R hand  . Arthroscopic surgery      L elbow   . Colonoscopy  2008    Dr. Sharen Hint  . Total abdominal hysterectomy  1994    Social History  reports that she has never smoked. She has never used smokeless tobacco. She reports that she drinks alcohol. She reports that she does not use illicit drugs.  Family History family history includes Aneurysm in her brother; Diabetes in her father; Heart attack in her sister; Hyperlipidemia in her mother and sister; Hypertension in her father and sister; Multiple sclerosis in her mother; Other in her father.   Review of Systems  Constitutional: Negative.   HENT: Negative.   Eyes: Negative.   Respiratory: Negative.   Cardiovascular: Negative.   Gastrointestinal: Negative.   Endocrine:  Negative.   Musculoskeletal: Negative.   Skin: Negative.   Allergic/Immunologic: Negative.   Neurological: Negative.   Hematological: Negative.   Psychiatric/Behavioral: Negative.        Poor sleep  All other systems reviewed and are negative.   BP 128/78 mmHg  Pulse 72  Ht 5\' 4"  (1.626 m)  Wt 222 lb 12 oz (101.039 kg)  BMI 38.22 kg/m2  Physical Exam  Constitutional: She is oriented to person, place, and time. She appears well-developed and well-nourished.  Obese  HENT:  Head: Normocephalic.  Nose: Nose normal.  Mouth/Throat: Oropharynx is clear and moist.  Eyes: Conjunctivae are normal. Pupils are equal, round, and reactive to light.  Neck: Normal range of motion. Neck supple. No JVD present.  Cardiovascular: Normal rate, regular rhythm, S1 normal, S2 normal, normal heart sounds and intact distal pulses.  Exam reveals no gallop and no friction rub.   No murmur heard. Pulmonary/Chest: Effort normal and breath sounds normal. No respiratory distress. She has no wheezes. She has no rales. She exhibits no tenderness.  Abdominal: Soft. Bowel sounds are normal. She exhibits no distension. There is no tenderness.  Musculoskeletal: Normal range of motion. She exhibits no edema or tenderness.  Lymphadenopathy:    She has no cervical adenopathy.  Neurological: She is alert and oriented to person, place, and time. Coordination normal.  Skin: Skin is warm and dry. No rash noted. No erythema.  Psychiatric: She has a normal mood and affect. Her behavior is normal. Judgment and thought content normal.    Assessment and Plan  Nursing note and vitals reviewed.

## 2014-08-08 NOTE — Assessment & Plan Note (Signed)
Blood pressure is well controlled on today's visit. No changes made to the medications. 

## 2014-11-02 ENCOUNTER — Ambulatory Visit
Admit: 2014-11-02 | Disposition: A | Discharge status: Home / Self Care | Payer: Self-pay | Attending: Family Medicine | Admitting: Family Medicine

## 2014-12-05 ENCOUNTER — Other Ambulatory Visit: Payer: Self-pay | Admitting: Cardiovascular Disease

## 2015-02-26 ENCOUNTER — Other Ambulatory Visit: Payer: Self-pay | Admitting: Cardiovascular Disease

## 2015-05-21 ENCOUNTER — Other Ambulatory Visit: Payer: Self-pay | Admitting: Cardiovascular Disease

## 2015-07-05 ENCOUNTER — Emergency Department
Admission: EM | Admit: 2015-07-05 | Discharge: 2015-07-06 | Disposition: A | Discharge status: Home / Self Care | Payer: Managed Care, Other (non HMO) | Attending: Emergency Medicine | Admitting: Emergency Medicine

## 2015-07-05 ENCOUNTER — Encounter: Payer: Self-pay | Admitting: Emergency Medicine

## 2015-07-05 DIAGNOSIS — Z7982 Long term (current) use of aspirin: Secondary | ICD-10-CM | POA: Insufficient documentation

## 2015-07-05 DIAGNOSIS — N390 Urinary tract infection, site not specified: Secondary | ICD-10-CM | POA: Diagnosis not present

## 2015-07-05 DIAGNOSIS — I10 Essential (primary) hypertension: Secondary | ICD-10-CM | POA: Insufficient documentation

## 2015-07-05 DIAGNOSIS — N10 Acute tubulo-interstitial nephritis: Secondary | ICD-10-CM

## 2015-07-05 DIAGNOSIS — R109 Unspecified abdominal pain: Secondary | ICD-10-CM | POA: Diagnosis present

## 2015-07-05 DIAGNOSIS — R319 Hematuria, unspecified: Secondary | ICD-10-CM

## 2015-07-05 DIAGNOSIS — Z79899 Other long term (current) drug therapy: Secondary | ICD-10-CM | POA: Diagnosis not present

## 2015-07-05 LAB — URINALYSIS COMPLETE WITH MICROSCOPIC (ARMC ONLY)
Bacteria, UA: NONE SEEN
Bilirubin Urine: NEGATIVE
Glucose, UA: NEGATIVE mg/dL
NITRITE: POSITIVE — AB
PH: 5 (ref 5.0–8.0)
SPECIFIC GRAVITY, URINE: 1.014 (ref 1.005–1.030)

## 2015-07-05 NOTE — ED Notes (Signed)
Pt states she does have hx of HTN, took medicine this evening, states normally BP runs around 118/68 or so.

## 2015-07-05 NOTE — ED Notes (Signed)
Pt arrived to the ED accompanied by her husband for complaints of urinary problems, pelvic and back pain. Pt reports that it feels like she has a UTI. Pt is AOx4 in mild pain during triage.

## 2015-07-05 NOTE — ED Notes (Signed)
Pt states she took 2 cranberry pills at 4p and drinking a lot of water today, but states she feels dry. Pt states back pain and lower abdominal pain. Pt denies hx of kidney stones, but states hx of UTI.

## 2015-07-06 ENCOUNTER — Emergency Department: Payer: Managed Care, Other (non HMO)

## 2015-07-06 LAB — COMPREHENSIVE METABOLIC PANEL
ALT: 21 U/L (ref 14–54)
AST: 19 U/L (ref 15–41)
Albumin: 4.1 g/dL (ref 3.5–5.0)
Alkaline Phosphatase: 52 U/L (ref 38–126)
Anion gap: 8 (ref 5–15)
BILIRUBIN TOTAL: 0.7 mg/dL (ref 0.3–1.2)
BUN: 17 mg/dL (ref 6–20)
CO2: 22 mmol/L (ref 22–32)
CREATININE: 0.79 mg/dL (ref 0.44–1.00)
Calcium: 8.7 mg/dL — ABNORMAL LOW (ref 8.9–10.3)
Chloride: 105 mmol/L (ref 101–111)
GFR calc Af Amer: >60 mL/min (ref >60)
Glucose, Bld: 142 mg/dL — ABNORMAL HIGH (ref 65–99)
Potassium: 3.9 mmol/L (ref 3.5–5.1)
Sodium: 135 mmol/L (ref 135–145)
TOTAL PROTEIN: 6.6 g/dL (ref 6.5–8.1)

## 2015-07-06 LAB — CBC WITH DIFFERENTIAL/PLATELET
BASOS ABS: 0.1 10*3/uL (ref 0–0.1)
Basophils Relative: 1 %
Eosinophils Absolute: 0.1 10*3/uL (ref 0–0.7)
Eosinophils Relative: 1 %
HEMATOCRIT: 37.4 % (ref 35.0–47.0)
Hemoglobin: 12.2 g/dL (ref 12.0–16.0)
LYMPHS PCT: 15 %
Lymphs Abs: 1.8 10*3/uL (ref 1.0–3.6)
MCH: 29.9 pg (ref 26.0–34.0)
MCHC: 32.7 g/dL (ref 32.0–36.0)
MCV: 91.5 fL (ref 80.0–100.0)
MONO ABS: 0.6 10*3/uL (ref 0.2–0.9)
Monocytes Relative: 5 %
NEUTROS ABS: 9.8 10*3/uL — AB (ref 1.4–6.5)
Neutrophils Relative %: 78 %
Platelets: 243 10*3/uL (ref 150–440)
RBC: 4.09 MIL/uL (ref 3.80–5.20)
RDW: 13.3 % (ref 11.5–14.5)
WBC: 12.4 10*3/uL — AB (ref 3.6–11.0)

## 2015-07-06 MED ORDER — CEPHALEXIN 500 MG PO CAPS: 500.0000 mg | ORAL_CAPSULE | Freq: Three times a day (TID) | ORAL | Status: DC

## 2015-07-06 MED ORDER — ONDANSETRON HCL 4 MG/2ML IJ SOLN
4.0000 mg | Freq: Once | INTRAMUSCULAR | Status: AC
  Administered 2015-07-06: 4 mg via INTRAVENOUS
  Filled 2015-07-06: qty 2

## 2015-07-06 MED ORDER — SODIUM CHLORIDE 0.9 % IV BOLUS (SEPSIS)
1000.0000 mL | INTRAVENOUS | Status: AC
  Administered 2015-07-06: 1000 mL via INTRAVENOUS

## 2015-07-06 MED ORDER — DOCUSATE SODIUM 100 MG PO CAPS: ORAL_CAPSULE | ORAL | Status: DC

## 2015-07-06 MED ORDER — DEXTROSE 5 % IV SOLN
1.0000 g | INTRAVENOUS | Status: AC
  Administered 2015-07-06: 1 g via INTRAVENOUS
  Filled 2015-07-06: qty 10

## 2015-07-06 MED ORDER — HYDROCODONE-ACETAMINOPHEN 5-325 MG PO TABS: 1.0000 | ORAL_TABLET | ORAL | Status: DC | PRN

## 2015-07-06 MED ORDER — MORPHINE SULFATE (PF) 4 MG/ML IV SOLN
4.0000 mg | Freq: Once | INTRAVENOUS | Status: AC
  Administered 2015-07-06: 4 mg via INTRAVENOUS
  Filled 2015-07-06: qty 1

## 2015-07-06 NOTE — ED Provider Notes (Signed)
[facility] Emergency Department Provider Note  ____________________________________________  Time seen: Approximately 12:00 AM  I have reviewed the triage vital signs and the nursing notes.   HISTORY  Chief Complaint Urinary Tract Infection    HPI Erica Brock is a 64 y.o. female who is generally in good health and resents by private vehicle to the emergency department complaining of about 2 days of gradual onset worsening suprapubic pain, dysuria, and now right flank pain.  She states that her urine has been cloudy and that she has been having some pain when she goes to the bathroom.  She has had urinary tract infections in the past and states that this feels similar but much worse than ones she has had before.  She denies fever/chills, chest pain, shortness of breath, nausea/vomiting.  She describes the right flank pain is sharp and severe in intensity, movement makes it worse, nothing makes it better.  The suprapubic pain is also sharp and stabbing and is mild to moderate.   Past Medical History  Diagnosis Date  . Anxiety   . Arthritis   . GERD (gastroesophageal reflux disease)   . HLD (hyperlipidemia)   . HTN (hypertension)     for < 10 years  . Rheumatic fever     as a child  . Hepatitis     as a child   . Heart murmur     ETT myoview 12/09 (Dr. Juel Burrow) no evidence of ischemia, good exercise tolerance - 9 min. echo (11/10): EF 60-65%, nml RV, PASP 25, nml valves  . OSA (obstructive sleep apnea)     mild; uses CPAP  . Obesity   . Diffuse cystic mastopathy 2012    Patient Active Problem List   Diagnosis Date Noted  . Headache 07/22/2013  . Fibrocystic breast 11/09/2012  . Obese 09/02/2011  . Insomnia 09/02/2011  . OSA on CPAP 09/02/2011  . OTHER MALAISE AND FATIGUE 09/11/2010  . Hyperlipidemia 05/22/2009  . Essential hypertension 05/22/2009  . CHEST PAIN-UNSPECIFIED 05/22/2009  . RHEUMATIC FEVER, HX OF 05/22/2009    Past Surgical  History  Procedure Laterality Date  . Tonsillectomy    . Reconstruction foot and bunion surgery - both feet    . Carpael tunnel release      R hand  . Arthroscopic surgery      L elbow   . Colonoscopy  2008    Dr. Sharen Hint  . Total abdominal hysterectomy  1994    Current Outpatient Rx  Name  Route  Sig  Dispense  Refill  . aspirin 81 MG EC tablet   Oral   Take 81 mg by mouth daily.           . cephALEXin (KEFLEX) 500 MG capsule   Oral   Take 1 capsule (500 mg total) by mouth 3 (three) times daily.   36 capsule   0   . cloNIDine (CATAPRES) 0.2 MG tablet      TAKE 1 TABLET BY MOUTH TWICE DAILY   180 tablet   3   . docusate sodium (COLACE) 100 MG capsule      Take 1 tablet once or twice daily as needed for constipation while taking narcotic pain medicine   30 capsule   0   . furosemide (LASIX) 20 MG tablet   Oral   Take 1 tablet (20 mg total) by mouth daily as needed.   30 tablet   3   . hydrALAZINE (APRESOLINE) 50 MG  tablet   Oral   Take 1 tablet (50 mg total) by mouth 3 (three) times daily.   90 tablet   3   . HYDROcodone-acetaminophen (NORCO/VICODIN) 5-325 MG tablet   Oral   Take 1-2 tablets by mouth every 4 (four) hours as needed for moderate pain.   15 tablet   0   . lisinopril (PRINIVIL,ZESTRIL) 40 MG tablet      TAKE 1 TABLET BY MOUTH DAILY   90 tablet   3   . loratadine (CLARITIN) 10 MG tablet   Oral   Take 10 mg by mouth as needed.          . pantoprazole (PROTONIX) 40 MG tablet   Oral   Take 40 mg by mouth daily.           . potassium chloride (K-DUR) 10 MEQ tablet   Oral   Take 1 tablet (10 mEq total) by mouth daily. Patient taking differently: Take 10 mEq by mouth daily as needed.    30 tablet   3   . simvastatin (ZOCOR) 20 MG tablet      TAKE 1 TABLET BY MOUTH AT BEDTIME   90 tablet   3   . ZETIA 10 MG tablet      TAKE 1 TABLET BY MOUTH DAILY   90 tablet   3     Allergies Atorvastatin  Family History  Problem  Relation Age of Onset  . Diabetes Father   . Hypertension Father   . Other Father     PPM  . Multiple sclerosis Mother   . Hyperlipidemia Mother   . Aneurysm Brother     brain  . Heart attack Sister     LATE 30'S, 40  . Hypertension Sister   . Hyperlipidemia Sister     Social History Social History  Substance Use Topics  . Smoking status: Never Smoker   . Smokeless tobacco: Never Used     Comment: tobacco use - no  . Alcohol Use: Yes     Comment: social     Review of Systems Constitutional: No fever/chills Eyes: No visual changes. ENT: No sore throat. Cardiovascular: Denies chest pain. Respiratory: Denies shortness of breath. Gastrointestinal: suprapubic pain and right-sided flank pain.no nausea vomiting or diarrhea Genitourinary: burning dysuria and suprapubic pain Musculoskeletal: Negative for back pain. Skin: Negative for rash. Neurological: Negative for headaches, focal weakness or numbness.  10-point ROS otherwise negative.  ____________________________________________   PHYSICAL EXAM:  VITAL SIGNS: ED Triage Vitals  Enc Vitals Group     BP 07/05/15 2251 175/91 mmHg     Pulse Rate 07/05/15 2251 101     Resp 07/05/15 2251 20     Temp 07/05/15 2251 98.4 F (36.9 C)     Temp Source 07/05/15 2251 Oral     SpO2 07/05/15 2251 97 %     Weight 07/05/15 2251 212 lb (96.163 kg)     Height 07/05/15 2251 5\' 4"  (1.626 m)     Head Cir --      Peak Flow --      Pain Score 07/05/15 2251 7     Pain Loc --      Pain Edu? --      Excl. in GC? --     Constitutional: Alert and oriented. Well appearing except for appearing mildly uncomfortable Eyes: Conjunctivae are normal. PERRL. EOMI. Head: Atraumatic. Nose: No congestion/rhinnorhea. Mouth/Throat: Mucous membranes are moist.  Oropharynx non-erythematous. Neck: No stridor.  Cardiovascular: Normal rate, regular rhythm. Grossly normal heart sounds.  Good peripheral circulation. Respiratory: Normal respiratory  effort.  No retractions. Lungs CTAB. Gastrointestinal: Soft with minimal tenderness to palpation of the suprapubic region. No distention. No abdominal bruits. Moderate to severe right-sided CVA tenderness Musculoskeletal: No lower extremity tenderness nor edema.  No joint effusions. Neurologic:  Normal speech and language. No gross focal neurologic deficits are appreciated.  Skin:  Skin is warm, dry and intact. No rash noted. Psychiatric: Mood and affect are normal. Speech and behavior are normal.  ____________________________________________   LABS (all labs ordered are listed, but only abnormal results are displayed)  Labs Reviewed  URINALYSIS COMPLETEWITH MICROSCOPIC ([facility] ONLY) - Abnormal; Notable for the following:    Color, Urine YELLOW (*)    APPearance CLOUDY (*)    Ketones, ur TRACE (*)    Hgb urine dipstick 3+ (*)    Protein, ur >500 (*)    Nitrite POSITIVE (*)    Leukocytes, UA 3+ (*)    Squamous Epithelial / LPF 0-5 (*)    All other components within normal limits  CBC WITH DIFFERENTIAL/PLATELET - Abnormal; Notable for the following:    WBC 12.4 (*)    Neutro Abs 9.8 (*)    All other components within normal limits  COMPREHENSIVE METABOLIC PANEL - Abnormal; Notable for the following:    Glucose, Bld 142 (*)    Calcium 8.7 (*)    All other components within normal limits  URINE CULTURE   ____________________________________________  EKG  Not indicated ____________________________________________  RADIOLOGY   Ct Renal Stone Study  07/06/2015  CLINICAL DATA:  Acute onset of dysuria and right flank pain. Hematuria. Initial encounter. EXAM: CT ABDOMEN AND PELVIS WITHOUT CONTRAST TECHNIQUE: Multidetector CT imaging of the abdomen and pelvis was performed following the standard protocol without IV contrast. COMPARISON:  Right upper quadrant ultrasound performed 02/05/2012 FINDINGS: The visualized lung bases are clear. Diffuse coronary artery calcifications are seen.  Trace pericardial fluid remains within normal limits. A vague 2.4 cm hypodensity within the periphery of the right hepatic lobe may simply reflect scarring, though would correlate with LFTs. The liver and spleen are otherwise unremarkable in appearance. The gallbladder is within normal limits. The pancreas and adrenal glands are unremarkable. Mild bilateral perinephric stranding is noted. There is also stranding about the right renal pelvis, with wall thickening along the course of the right ureter, and mild soft tissue inflammation along the courses of both ureters. This raises concern for bilateral ureteritis and possibly mild bilateral pyelonephritis. There is no evidence of hydronephrosis. No renal or ureteral stones are identified. No free fluid is identified. The small bowel is unremarkable in appearance. The stomach is within normal limits. No acute vascular abnormalities are seen. Scattered calcification is noted along the abdominal aorta and its branches. The appendix is normal in caliber and contains air, without evidence of appendicitis. The colon is unremarkable in appearance. The bladder is decompressed but irregularly thick-walled, raising concern for cystitis. The patient is status post hysterectomy. No suspicious adnexal masses are seen. No inguinal lymphadenopathy is seen. No acute osseous abnormalities are identified. There is grade 1 retrolisthesis of L1 on L2, and grade 1 anterolisthesis of L3 on L4 and of L4 on L5, reflecting underlying facet disease. Multilevel vacuum phenomenon is noted along the lower thoracic and lumbar spine. IMPRESSION: 1. Irregular wall thickening about the bladder raises concern for cystitis. 2. Wall thickening along the course of the right ureter, and mild soft tissue  inflammation about the courses of both ureters, concerning for bilateral ureteritis. Stranding about the right renal pelvis, and mild bilateral perinephric stranding. Mild bilateral pyelonephritis cannot  be excluded. 3. No evidence of hydronephrosis.  No renal or ureteral stones seen. 4. Vague 2.4 cm hypodensity at the periphery of the right hepatic lobe may simply reflect scarring, though would correlate with LFTs. Further evaluation (dynamic liver protocol MRI or CT) could be considered as deemed clinically appropriate. 5. Scattered calcification along the abdominal aorta and its branches. 6. Mild degenerative change noted along the lumbar spine. Electronically Signed   By: Roanna Raider M.D.   On: 07/06/2015 00:40    ____________________________________________   PROCEDURES  Procedure(s) performed: None  Critical Care performed: No ____________________________________________   INITIAL IMPRESSION / ASSESSMENT AND PLAN / ED COURSE  Pertinent labs & imaging results that were available during my care of the patient were reviewed by me and considered in my medical decision making (see chart for details).  The patient has grossly infected urine with too numerous to count white blood cells and red blood cells.  Given her CVA tenderness and the hematuria, although this is most likely a mild pyelonephritis, I am also concerned about the possibility of an impacted stone.  I will treat her empirically with ceftriaxone, IV fluids, morphine, and Zofran while also checking basic labs and a CT renal stone study to make sure she does not have an infected stone given the morbidity and mortality associated with such a diagnosis.  I have explained all this to the patient and she understands and agrees with the plan.  Assuming no obstructing stone, I anticipate discharge with outpatient follow-up.  ----------------------------------------- 3:26 AM on 07/06/2015 -----------------------------------------  No Evidence of stones, CT scan suggest pyelonephritis.  Patient has remained comfortable throughout ED stay with normal vital signs.  She received her initial treatment and I have written her outpatient  antibiotics.  I gave my usual and customary return precautions.  I also described to her the radiology commented about the spot on her liver and recommended that she follow up with her primary care doctor to discuss the need for further imaging.of note, her LFTs are completely normal.  ____________________________________________  FINAL CLINICAL IMPRESSION(S) / ED DIAGNOSES  Final diagnoses:  Acute pyelonephritis  UTI (lower urinary tract infection)  Acute right flank pain  Hematuria      NEW MEDICATIONS STARTED DURING THIS VISIT:  New Prescriptions   CEPHALEXIN (KEFLEX) 500 MG CAPSULE    Take 1 capsule (500 mg total) by mouth 3 (three) times daily.   DOCUSATE SODIUM (COLACE) 100 MG CAPSULE    Take 1 tablet once or twice daily as needed for constipation while taking narcotic pain medicine   HYDROCODONE-ACETAMINOPHEN (NORCO/VICODIN) 5-325 MG TABLET    Take 1-2 tablets by mouth every 4 (four) hours as needed for moderate pain.     Loleta Rose, MD 07/06/15 [phone]

## 2015-07-06 NOTE — ED Notes (Signed)

## 2015-07-06 NOTE — ED Notes (Signed)
Dr. Forbach at bedside.  

## 2015-07-06 NOTE — Discharge Instructions (Signed)
Your workup today suggests that you have a urinary tract infection (UTI) which has spread to your kidneys.  Please take your antibiotic as prescribed and over-the-counter pain medication (Tylenol or Motrin) as needed, but no more than recommended on the label instructions.  Drink PLENTY of fluids.  Call your regular doctor to schedule the next available appointment to follow up on todays ED visit, or return immediately to the ED if your pain worsens, you have decreased urine production, develop fever, persistent vomiting, or other symptoms that concern you.   Pyelonephritis, Adult Pyelonephritis is a kidney infection. The kidneys are the organs that filter a person's blood and move waste out of the bloodstream and into the urine. Urine passes from the kidneys, through the ureters, and into the bladder. There are two main types of pyelonephritis:  Infections that come on quickly without any warning (acute pyelonephritis).  Infections that last for a long period of time (chronic pyelonephritis). In most cases, the infection clears up with treatment and does not cause further problems. More severe infections or chronic infections can sometimes spread to the bloodstream or lead to other problems with the kidneys. CAUSES This condition is usually caused by:  Bacteria traveling from the bladder to the kidney through infected urine. The urine in the bladder can become infected with bacteria from:  Bladder infection (cystitis).  Inflammation of the prostate gland (prostatitis).  Sexual intercourse, in females.  Bacteria traveling from the bloodstream to the kidney. RISK FACTORS This condition is more likely to develop in:  Pregnant women.  Older people.  People who have diabetes.  People who have kidney stones or bladder stones.  People who have other abnormalities of the kidney or ureter.  People who have a catheter placed in the bladder.  People who have cancer.  People who are  sexually active.  Women who use spermicides.  People who have had a prior urinary tract infection. SYMPTOMS Symptoms of this condition include:  Frequent urination.  Strong or persistent urge to urinate.  Burning or stinging when urinating.  Abdominal pain.  Back pain.  Pain in the side or flank area.  Fever.  Chills.  Blood in the urine, or dark urine.  Nausea.  Vomiting. DIAGNOSIS This condition may be diagnosed based on:  Medical history and physical exam.  Urine tests.  Blood tests. You may also have imaging tests of the kidneys, such as an ultrasound or CT scan. TREATMENT Treatment for this condition may depend on the severity of the infection.  If the infection is mild and is found early, you may be treated with antibiotic medicines taken by mouth. You will need to drink fluids to remain hydrated.  If the infection is more severe, you may need to stay in the hospital and receive antibiotics given directly into a vein through an IV tube. You may also need to receive fluids through an IV tube if you are not able to remain hydrated. After your hospital stay, you may need to take oral antibiotics for a period of time. Other treatments may be required, depending on the cause of the infection. HOME CARE INSTRUCTIONS Medicines  Take over-the-counter and prescription medicines only as told by your health care provider.  If you were prescribed an antibiotic medicine, take it as told by your health care provider. Do not stop taking the antibiotic even if you start to feel better. General Instructions  Drink enough fluid to keep your urine clear or pale yellow.  Avoid caffeine,  tea, and carbonated beverages. They tend to irritate the bladder.  Urinate often. Avoid holding in urine for long periods of time.  Urinate before and after sex.  After a bowel movement, women should cleanse from front to back. Use each tissue only once.  Keep all follow-up visits as  told by your health care provider. This is important. SEEK MEDICAL CARE IF:  Your symptoms do not get better after 2 days of treatment.  Your symptoms get worse.  You have a fever. SEEK IMMEDIATE MEDICAL CARE IF:  You are unable to take your antibiotics or fluids.  You have shaking chills.  You vomit.  You have severe flank or back pain.  You have extreme weakness or fainting.   This information is not intended to replace advice given to you by your health care provider. Make sure you discuss any questions you have with your health care provider.   Document Released: 07/07/2005 Document Revised: 03/28/2015 Document Reviewed: 10/30/2014 Elsevier Interactive Patient Education [employer]

## 2015-07-06 NOTE — ED Notes (Signed)
Pt transported to CT via stretcher.  

## 2015-07-08 LAB — URINE CULTURE
Culture: >=100000
SPECIAL REQUESTS: NORMAL

## 2015-08-02 ENCOUNTER — Other Ambulatory Visit: Payer: Self-pay | Admitting: Family Medicine

## 2015-08-02 DIAGNOSIS — R160 Hepatomegaly, not elsewhere classified: Secondary | ICD-10-CM

## 2015-08-07 ENCOUNTER — Encounter: Payer: Self-pay | Admitting: Cardiovascular Disease

## 2015-08-07 ENCOUNTER — Ambulatory Visit (INDEPENDENT_AMBULATORY_CARE_PROVIDER_SITE_OTHER): Payer: Managed Care, Other (non HMO) | Admitting: Cardiovascular Disease

## 2015-08-07 VITALS — BP 100/64 | HR 73 | Ht 64.0 in | Wt 214.8 lb

## 2015-08-07 DIAGNOSIS — Z79899 Other long term (current) drug therapy: Secondary | ICD-10-CM

## 2015-08-07 DIAGNOSIS — R079 Chest pain, unspecified: Secondary | ICD-10-CM | POA: Diagnosis not present

## 2015-08-07 DIAGNOSIS — I10 Essential (primary) hypertension: Secondary | ICD-10-CM | POA: Diagnosis not present

## 2015-08-07 DIAGNOSIS — E785 Hyperlipidemia, unspecified: Secondary | ICD-10-CM

## 2015-08-07 DIAGNOSIS — E669 Obesity, unspecified: Secondary | ICD-10-CM

## 2015-08-07 MED ORDER — ROSUVASTATIN CALCIUM 40 MG PO TABS: 40.0000 mg | ORAL_TABLET | Freq: Every day | ORAL | Status: DC

## 2015-08-07 MED ORDER — CLONIDINE HCL 0.1 MG PO TABS: 0.1000 mg | ORAL_TABLET | Freq: Two times a day (BID) | ORAL | Status: DC

## 2015-08-07 MED ORDER — EZETIMIBE 10 MG PO TABS: 10.0000 mg | ORAL_TABLET | Freq: Every day | ORAL | Status: DC

## 2015-08-07 NOTE — Assessment & Plan Note (Signed)
She denies any chest pain symptoms concerning for angina. No further workup at this time

## 2015-08-07 NOTE — Assessment & Plan Note (Signed)
Recommended she stop Zocor, start Crestor 20 g daily for several weeks then up to 40 mg daily Stay on zetia Recheck lipids in May 2017

## 2015-08-07 NOTE — Patient Instructions (Signed)
You are doing well.  Please decrease the clonidine down to 0.1 mg twice a day  Stop the zocor/simvastatin Start crestor 20 to 40 once a day with zetia once a day  Call if the blood pressure continues to run low  Please call us if you have new issues that need to be addressed before your next appt.  Your physician wants you to follow-up in: 12 months.  You will receive a reminder letter in the mail two months in advance. If you don't receive a letter, please call our office to schedule the follow-up appointment.

## 2015-08-07 NOTE — Assessment & Plan Note (Signed)
Blood pressures very low on today's visit, 98 systolic on my check, left arm  recommended she decrease clonidine down to 0.1 mg twice a day If blood pressure continues to run low, will stop the clonidine

## 2015-08-07 NOTE — Progress Notes (Signed)
Patient ID: Erica Brock, female    DOB: 1950/09/06, 65 y.o.   MRN: [id number]  HPI Comments: 65 yo with history of HTN, hyperlipidemia, obesity, Obstructive sleep apnea who wears CPAP, and family history of CAD comes for followup of her hyperlipidemia and hypertension  In follow up today, she reports that she feels tired Blood pressure has been running low, typically 100 up to 110 systolic Recent lab work showing total cholesterol 190, hemoglobin A1c 6.0 No regular exercise program Denies any symptoms concerning for angina Otherwise no new complaints She reports that she was ill several months ago, blood pressure and high, had to take hydralazine when necessary  EKG on today's visit shows normal sinus rhythm with rate 74 bpm, no significant ST or T-wave changes   Other past medical history  She had leg edema 07/19/2013 and took Lasix. December 31 had high blood pressure, neck pain, headache. She went to the emergency room and reports systolic pressure was more than 200. She was given extra clonidine. She went home and slept on new years day.   Previous blood work showed low sodium, low potassium on HCTZ. This was discontinued   sister has had bypass surgery  Previous cholesterol 198, LDL 87, triglycerides 300, HDL 52, hemoglobin A1c 5.8 The patient has never smoked.   Lab work from the emergency room 07/20/2013 was essentially normal. Cardiac enzymes negative, CBC basic metabolic panel normal Chest x-ray normal     Allergies  Allergen Reactions  . Atorvastatin     Muscle aches     Outpatient Encounter Prescriptions as of 08/07/2015  Medication Sig  . aspirin 81 MG EC tablet Take 81 mg by mouth daily.    . CHROMIUM PO Take by mouth daily.  . cloNIDine (CATAPRES) 0.1 MG tablet Take 1 tablet (0.1 mg total) by mouth 2 (two) times daily.  Marland Kitchen ezetimibe (ZETIA) 10 MG tablet Take 1 tablet (10 mg total) by mouth daily.  . furosemide (LASIX) 20 MG tablet Take 1 tablet (20 mg total) by  mouth daily as needed.  Marland Kitchen guaiFENesin-codeine (ROBITUSSIN AC) 100-10 MG/5ML syrup Take 5 mLs by mouth 4 (four) times daily as needed.   . hydrALAZINE (APRESOLINE) 50 MG tablet Take 50 mg by mouth 3 (three) times daily as needed.  Marland Kitchen lisinopril (PRINIVIL,ZESTRIL) 40 MG tablet TAKE 1 TABLET BY MOUTH DAILY  . loratadine (CLARITIN) 10 MG tablet Take 10 mg by mouth as needed.   . metFORMIN (GLUCOPHAGE-XR) 500 MG 24 hr tablet Take 500 mg by mouth daily with breakfast.   . pantoprazole (PROTONIX) 40 MG tablet Take 40 mg by mouth daily.    . potassium chloride (K-DUR) 10 MEQ tablet Take 10 mEq by mouth daily as needed.  . [DISCONTINUED] cloNIDine (CATAPRES) 0.2 MG tablet TAKE 1 TABLET BY MOUTH TWICE DAILY  . [DISCONTINUED] simvastatin (ZOCOR) 20 MG tablet TAKE 1 TABLET BY MOUTH AT BEDTIME  . [DISCONTINUED] ZETIA 10 MG tablet TAKE 1 TABLET BY MOUTH DAILY  . rosuvastatin (CRESTOR) 40 MG tablet Take 1 tablet (40 mg total) by mouth daily.  . [DISCONTINUED] cephALEXin (KEFLEX) 500 MG capsule Take 1 capsule (500 mg total) by mouth 3 (three) times daily. (Patient not taking: Reported on 08/07/2015)  . [DISCONTINUED] docusate sodium (COLACE) 100 MG capsule Take 1 tablet once or twice daily as needed for constipation while taking narcotic pain medicine (Patient not taking: Reported on 08/07/2015)  . [DISCONTINUED] hydrALAZINE (APRESOLINE) 50 MG tablet Take 1 tablet (50 mg total)  by mouth 3 (three) times daily. (Patient not taking: Reported on 08/07/2015)  . [DISCONTINUED] HYDROcodone-acetaminophen (NORCO/VICODIN) 5-325 MG tablet Take 1-2 tablets by mouth every 4 (four) hours as needed for moderate pain. (Patient not taking: Reported on 08/07/2015)  . [DISCONTINUED] potassium chloride (K-DUR) 10 MEQ tablet Take 1 tablet (10 mEq total) by mouth daily. (Patient not taking: Reported on 08/07/2015)   No facility-administered encounter medications on file as of 08/07/2015.    Past Medical History  Diagnosis Date  . Anxiety    . Arthritis   . GERD (gastroesophageal reflux disease)   . HLD (hyperlipidemia)   . HTN (hypertension)     for < 10 years  . Rheumatic fever     as a child  . Hepatitis     as a child   . Heart murmur     ETT myoview 12/09 (Dr. Juel Burrow) no evidence of ischemia, good exercise tolerance - 9 min. echo (11/10): EF 60-65%, nml RV, PASP 25, nml valves  . OSA (obstructive sleep apnea)     mild; uses CPAP  . Obesity   . Diffuse cystic mastopathy 2012    Past Surgical History  Procedure Laterality Date  . Tonsillectomy    . Reconstruction foot and bunion surgery - both feet    . Carpael tunnel release      R hand  . Arthroscopic surgery      L elbow   . Colonoscopy  2008    Dr. Sharen Hint  . Total abdominal hysterectomy  1994    Social History  reports that she has never smoked. She has never used smokeless tobacco. She reports that she drinks alcohol. She reports that she does not use illicit drugs.  Family History family history includes Aneurysm in her brother; Diabetes in her father; Heart attack in her sister; Hyperlipidemia in her mother and sister; Hypertension in her father and sister; Multiple sclerosis in her mother; Other in her father.   Review of Systems  Constitutional: Negative.   HENT: Negative.   Respiratory: Negative.   Cardiovascular: Negative.   Gastrointestinal: Negative.   Musculoskeletal: Negative.   Neurological: Negative.   Hematological: Negative.   Psychiatric/Behavioral: Negative.        Poor sleep  All other systems reviewed and are negative.   BP 100/64 mmHg  Pulse 73  Ht 5\' 4"  (1.626 m)  Wt 214 lb 12 oz (97.41 kg)  BMI 36.84 kg/m2  Physical Exam  Constitutional: She is oriented to person, place, and time. She appears well-developed and well-nourished.  Obese  HENT:  Head: Normocephalic.  Nose: Nose normal.  Mouth/Throat: Oropharynx is clear and moist.  Eyes: Conjunctivae are normal. Pupils are equal, round, and reactive to light.   Neck: Normal range of motion. Neck supple. No JVD present.  Cardiovascular: Normal rate, regular rhythm, S1 normal, S2 normal, normal heart sounds and intact distal pulses.  Exam reveals no gallop and no friction rub.   No murmur heard. Pulmonary/Chest: Effort normal and breath sounds normal. No respiratory distress. She has no wheezes. She has no rales. She exhibits no tenderness.  Abdominal: Soft. Bowel sounds are normal. She exhibits no distension. There is no tenderness.  Musculoskeletal: Normal range of motion. She exhibits no edema or tenderness.  Lymphadenopathy:    She has no cervical adenopathy.  Neurological: She is alert and oriented to person, place, and time. Coordination normal.  Skin: Skin is warm and dry. No rash noted. No erythema.  Psychiatric: She has  a normal mood and affect. Her behavior is normal. Judgment and thought content normal.    Assessment and Plan  Nursing note and vitals reviewed.

## 2015-08-07 NOTE — Assessment & Plan Note (Signed)
We have encouraged continued exercise, careful diet management in an effort to lose weight. 

## 2015-08-10 ENCOUNTER — Ambulatory Visit (HOSPITAL_COMMUNITY)
Admission: RE | Admit: 2015-08-10 | Discharge: 2015-08-10 | Disposition: A | Discharge status: Home / Self Care | Payer: Managed Care, Other (non HMO) | Source: Ambulatory Visit | Attending: Family Medicine | Admitting: Family Medicine

## 2015-08-10 DIAGNOSIS — M5136 Other intervertebral disc degeneration, lumbar region: Secondary | ICD-10-CM | POA: Insufficient documentation

## 2015-08-10 DIAGNOSIS — M4186 Other forms of scoliosis, lumbar region: Secondary | ICD-10-CM | POA: Diagnosis not present

## 2015-08-10 DIAGNOSIS — R160 Hepatomegaly, not elsewhere classified: Secondary | ICD-10-CM | POA: Diagnosis present

## 2015-08-10 DIAGNOSIS — K769 Liver disease, unspecified: Secondary | ICD-10-CM | POA: Insufficient documentation

## 2015-08-10 DIAGNOSIS — M47816 Spondylosis without myelopathy or radiculopathy, lumbar region: Secondary | ICD-10-CM | POA: Insufficient documentation

## 2015-08-10 MED ORDER — GADOBENATE DIMEGLUMINE 529 MG/ML IV SOLN
20.0000 mL | Freq: Once | INTRAVENOUS | Status: AC | PRN
  Administered 2015-08-10: 20 mL via INTRAVENOUS

## 2015-11-15 ENCOUNTER — Other Ambulatory Visit: Payer: Self-pay | Admitting: Cardiovascular Disease

## 2015-11-16 LAB — HEPATIC FUNCTION PANEL
ALK PHOS: 56 IU/L (ref 39–117)
ALT: 16 IU/L (ref 0–32)
AST: 20 IU/L (ref 0–40)
Albumin: 4.7 g/dL (ref 3.6–4.8)
Bilirubin Total: 0.3 mg/dL (ref 0.0–1.2)
Bilirubin, Direct: 0.12 mg/dL (ref 0.00–0.40)
Total Protein: 7.1 g/dL (ref 6.0–8.5)

## 2015-11-16 LAB — LIPID PANEL W/O CHOL/HDL RATIO
CHOLESTEROL TOTAL: 158 mg/dL (ref 100–199)
HDL: 69 mg/dL (ref >39)
LDL Calculated: 58 mg/dL (ref 0–99)
TRIGLYCERIDES: 153 mg/dL — AB (ref 0–149)
VLDL Cholesterol Cal: 31 mg/dL (ref 5–40)

## 2015-12-24 ENCOUNTER — Other Ambulatory Visit: Payer: Self-pay | Admitting: Family Medicine

## 2015-12-24 DIAGNOSIS — Z1231 Encounter for screening mammogram for malignant neoplasm of breast: Secondary | ICD-10-CM

## 2016-01-14 ENCOUNTER — Ambulatory Visit: Payer: Managed Care, Other (non HMO)

## 2016-04-21 ENCOUNTER — Ambulatory Visit
Admission: RE | Admit: 2016-04-21 | Discharge: 2016-04-21 | Disposition: A | Discharge status: Home / Self Care | Payer: Managed Care, Other (non HMO) | Source: Ambulatory Visit | Attending: Family Medicine | Admitting: Family Medicine

## 2016-04-21 DIAGNOSIS — Z1231 Encounter for screening mammogram for malignant neoplasm of breast: Secondary | ICD-10-CM

## 2016-04-25 ENCOUNTER — Other Ambulatory Visit: Payer: Self-pay | Admitting: Family Medicine

## 2016-04-25 DIAGNOSIS — N6489 Other specified disorders of breast: Secondary | ICD-10-CM

## 2016-05-08 ENCOUNTER — Ambulatory Visit
Admission: RE | Admit: 2016-05-08 | Discharge: 2016-05-08 | Disposition: A | Discharge status: Home / Self Care | Payer: Managed Care, Other (non HMO) | Source: Ambulatory Visit | Attending: Family Medicine | Admitting: Family Medicine

## 2016-05-08 DIAGNOSIS — N6489 Other specified disorders of breast: Secondary | ICD-10-CM | POA: Diagnosis present

## 2016-06-28 ENCOUNTER — Other Ambulatory Visit: Payer: Self-pay | Admitting: Cardiovascular Disease

## 2016-08-08 ENCOUNTER — Encounter: Payer: Self-pay | Admitting: Cardiovascular Disease

## 2016-08-08 ENCOUNTER — Ambulatory Visit (INDEPENDENT_AMBULATORY_CARE_PROVIDER_SITE_OTHER): Payer: Managed Care, Other (non HMO) | Admitting: Cardiovascular Disease

## 2016-08-08 VITALS — BP 138/94 | HR 64 | Ht 64.0 in | Wt 216.0 lb

## 2016-08-08 DIAGNOSIS — I10 Essential (primary) hypertension: Secondary | ICD-10-CM | POA: Diagnosis not present

## 2016-08-08 DIAGNOSIS — Z8249 Family history of ischemic heart disease and other diseases of the circulatory system: Secondary | ICD-10-CM

## 2016-08-08 DIAGNOSIS — G4733 Obstructive sleep apnea (adult) (pediatric): Secondary | ICD-10-CM | POA: Diagnosis not present

## 2016-08-08 DIAGNOSIS — Z9989 Dependence on other enabling machines and devices: Secondary | ICD-10-CM

## 2016-08-08 DIAGNOSIS — E119 Type 2 diabetes mellitus without complications: Secondary | ICD-10-CM | POA: Insufficient documentation

## 2016-08-08 DIAGNOSIS — E782 Mixed hyperlipidemia: Secondary | ICD-10-CM | POA: Diagnosis not present

## 2016-08-08 NOTE — Progress Notes (Signed)
Cardiology Office Note  Date:  08/08/2016   ID:  Erica Brock, DOB Feb 10, 1951, MRN [id number]  PCP:  Leim Fabry, MD   Chief Complaint  Patient presents with  . other    44mo f/u. Pt states she is doing well. Reviewed meds with pt verbally.    HPI:  66 yo woman with history of HTN, hyperlipidemia, obesity, Type 2 diabetes, Obstructive sleep apnea who wears CPAP,  family history of CAD (sister was smoker with MI and CABG) comes for followup of her hyperlipidemia and hypertension, CAD risk No prior smoking history  In follow-up she reports that she is doing well, denies any shortness of breath or chest pain on exertion Tolerating crestor 40 mg daily and Co Q10, sometimes with myalgias Drinks Herbalife, trying to lose weight Recent sinus infection, treated with antibiotics BP at home better than they are today No regular exercise program takes care of grandchildren She decreased her hydralazine down to 20 mg daily as blood pressure was well controlled at home  Previous hemoglobin A1c 6.0 Recent lab work reviewed with her showing total cholesterol 132, LDL 44, fasting glucose 159 Date of lab work November 2017  EKG on today's visit shows normal sinus rhythm with rate 64 bpm, no significant ST or T-wave changes   Other past medical history  She had leg edema 07/19/2013 and took Lasix. December 31 had high blood pressure, neck pain, headache. She went to the emergency room and reports systolic pressure was more than 200. She was given extra clonidine. She went home and slept on new years day.   Previous blood work showed low sodium, low potassium on HCTZ. This was discontinued   sister has had bypass surgery  Previous cholesterol 198, LDL 87, triglycerides 300, HDL 52, hemoglobin A1c 5.8 The patient has never smoked.   Lab work from the emergency room 07/20/2013 was essentially normal. Cardiac enzymes negative, CBC basic metabolic panel normal Chest x-ray normal     PMH:    has a past medical history of Anxiety; Arthritis; Diffuse cystic mastopathy (2012); GERD (gastroesophageal reflux disease); Heart murmur; Hepatitis; HLD (hyperlipidemia); HTN (hypertension); Obesity; OSA (obstructive sleep apnea); and Rheumatic fever.  PSH:    Past Surgical History:  Procedure Laterality Date  . arthroscopic surgery     L elbow   . BREAST BIOPSY Left    needle bx-neg  . carpael tunnel release     R hand  . COLONOSCOPY  2008   Dr. Sharen Hint  . reconstruction foot and bunion surgery - both feet    . TONSILLECTOMY    . TOTAL ABDOMINAL HYSTERECTOMY  1994    Current Outpatient Prescriptions  Medication Sig Dispense Refill  . aspirin 81 MG EC tablet Take 81 mg by mouth daily.      . cetirizine (ZYRTEC) 10 MG tablet Take 10 mg by mouth daily as needed for allergies.    . CHROMIUM PO Take by mouth daily.    . cloNIDine (CATAPRES) 0.1 MG tablet TAKE 1 TABLET BY MOUTH TWICE DAILY 180 tablet 0  . ezetimibe (ZETIA) 10 MG tablet TAKE 1 TABLET BY MOUTH DAILY 90 tablet 0  . furosemide (LASIX) 20 MG tablet Take 1 tablet (20 mg total) by mouth daily as needed. 30 tablet 3  . guaiFENesin-codeine (ROBITUSSIN AC) 100-10 MG/5ML syrup Take 5 mLs by mouth 4 (four) times daily as needed.     . hydrALAZINE (APRESOLINE) 50 MG tablet Take 50 mg by mouth 3 (three) times daily  as needed.    Marland Kitchen lisinopril (PRINIVIL,ZESTRIL) 40 MG tablet TAKE 1 TABLET BY MOUTH DAILY (Patient taking differently: Taking 1/2 tab daily.) 90 tablet 3  . metFORMIN (GLUCOPHAGE-XR) 500 MG 24 hr tablet Take 500 mg by mouth daily with breakfast.     . montelukast (SINGULAIR) 10 MG tablet Take 10 mg by mouth daily as needed.    . pantoprazole (PROTONIX) 40 MG tablet Take 40 mg by mouth daily.      . potassium chloride (K-DUR) 10 MEQ tablet Take 10 mEq by mouth daily as needed.    . rosuvastatin (CRESTOR) 40 MG tablet TAKE 1 TABLET BY MOUTH DAILY 90 tablet 0   No current facility-administered medications for this visit.       Allergies:   Atorvastatin   Social History:  The patient  reports that she has never smoked. She has never used smokeless tobacco. She reports that she drinks alcohol. She reports that she does not use drugs.   Family History:   family history includes Aneurysm in her brother; Diabetes in her father; Heart attack in her sister; Hyperlipidemia in her mother and sister; Hypertension in her father and sister; Multiple sclerosis in her mother; Other in her father.    Review of Systems: Review of Systems  Constitutional: Negative.   Respiratory: Negative.   Cardiovascular: Negative.   Gastrointestinal: Negative.   Musculoskeletal: Negative.   Neurological: Negative.   Psychiatric/Behavioral: Negative.   All other systems reviewed and are negative.    PHYSICAL EXAM: VS:  BP (!) 138/94 (BP Location: Left Arm, Patient Position: Sitting, Cuff Size: Normal)   Pulse 64   Ht 5\' 4"  (1.626 m)   Wt 216 lb (98 kg)   BMI 37.08 kg/m  , BMI Body mass index is 37.08 kg/m. GEN: Well nourished, well developed, in no acute distress  HEENT: normal  Neck: no JVD, carotid bruits, or masses Cardiac: RRR; no murmurs, rubs, or gallops,no edema  Respiratory:  clear to auscultation bilaterally, normal work of breathing GI: soft, nontender, nondistended, + BS MS: no deformity or atrophy  Skin: warm and dry, no rash Neuro:  Strength and sensation are intact Psych: euthymic mood, full affect    Recent Labs: 11/15/2015: ALT 16    Lipid Panel Lab Results  Component Value Date   CHOL 158 11/15/2015   HDL 69 11/15/2015   LDLCALC 58 11/15/2015   TRIG 153 (H) 11/15/2015      Wt Readings from Last 3 Encounters:  08/08/16 216 lb (98 kg)  08/07/15 214 lb 12 oz (97.4 kg)  07/05/15 212 lb (96.2 kg)       ASSESSMENT AND PLAN:  Essential hypertension - Plan: EKG 12-Lead, CT CARDIAC SCORING Blood pressure elevated on today's visit. No changes made to the medications. She reports blood  pressures well controlled at home. She will continue to monitor blood pressure at home and if it runs high she will increase lisinopril back to 40 mg daily  Family history of premature CAD - Plan: CT CARDIAC SCORING Long discussion about her family history, Patient has no smoking history but does have diabetes, hyperlipidemia We discussed various options for screening We have recommended she consider CT coronary calcium scoring. Order has been placed, she will call to schedule the scan. Mixed hyperlipidemia  OSA on CPAP Compliant with her CPAP  Controlled type 2 diabetes mellitus without complication, without long-term current use of insulin (HCC) Diet discussed with her She is drinking Herbalife shakes We have  encouraged continued exercise, careful diet management in an effort to lose weight.  Hyperlipidemia Cholesterol is at goal on the current lipid regimen. No changes to the medications were made. Continue Crestor 40 mg daily   Total encounter time more than 25 minutes  Greater than 50% was spent in counseling and coordination of care with the patient \  Disposition:   F/U  12 months   Orders Placed This Encounter  Procedures  . CT CARDIAC SCORING  . EKG 12-Lead     Signed, Dossie Arbour, M.D., Ph.D. 08/08/2016  [facility] [place], [place] [phone]

## 2016-08-08 NOTE — Patient Instructions (Addendum)
Medication Instructions:   No medication changes made  Labwork:  No new labs needed  Testing/Procedures:  We will order a CT coronary calcium score for family history of CAD There is a one-time fee of $150 due at the time of your procedure [address], [place] Please call [phone] to schedule   I recommend watching educational videos on topics of interest to you at:       [email or web address]  Enter code: HEARTCARE    Follow-Up: It was a pleasure seeing you in the office today. Please call us if you have new issues that need to be addressed before your next appt.  [phone]  Your physician wants you to follow-up in: 12 months.  You will receive a reminder letter in the mail two months in advance. If you don't receive a letter, please call our office to schedule the follow-up appointment.  If you need a refill on your cardiac medications before your next appointment, please call your pharmacy.    Coronary Calcium Scan A coronary calcium scan is an imaging test used to look for deposits of calcium and other fatty materials (plaques) in the inner lining of the blood vessels of your heart (coronary arteries). These deposits of calcium and plaques can partly clog and narrow the coronary arteries without producing any symptoms or warning signs. This puts you at risk for a heart attack. This test can detect these deposits before symptoms develop.  LET [facility] CARE PROVIDER KNOW ABOUT:  Any allergies you have.  All medicines you are taking, including vitamins, herbs, eye drops, creams, and over-the-counter medicines.  Previous problems you or members of your family have had with the use of anesthetics.  Any blood disorders you have.  Previous surgeries you have had.  Medical conditions you have.  Possibility of pregnancy, if this applies. RISKS AND COMPLICATIONS Generally, this is a safe procedure. However, as with any procedure, complications can  occur. This test involves the use of radiation. Radiation exposure can be dangerous to a pregnant woman and her unborn baby. If you are pregnant, you should not have this procedure done.  BEFORE THE PROCEDURE There is no special preparation for the procedure. PROCEDURE  You will need to undress and put on a hospital gown. You will need to remove any jewelry around your neck or chest.  Sticky electrodes are placed on your chest and are connected to an electrocardiogram (EKG or electrocardiography) machine to recorda tracing of the electrical activity of your heart.  A CT scanner will take pictures of your heart. During this time, you will be asked to lie still and hold your breath for 2-3 seconds while a picture is being taken of your heart. AFTER THE PROCEDURE   You will be allowed to get dressed.  You can return to your normal activities after the scan is done. This information is not intended to replace advice given to you by your health care provider. Make sure you discuss any questions you have with your health care provider. Document Released: 01/03/2008 Document Revised: 07/12/2013 Document Reviewed: 03/14/2013 Elsevier Interactive Patient Education  2017 [employer].

## 2016-08-09 ENCOUNTER — Other Ambulatory Visit: Payer: Self-pay | Admitting: Cardiovascular Disease

## 2016-08-12 ENCOUNTER — Other Ambulatory Visit: Payer: Self-pay | Admitting: *Deleted

## 2016-08-12 MED ORDER — LISINOPRIL 40 MG PO TABS: 20.0000 mg | ORAL_TABLET | Freq: Every day | ORAL | 3 refills | Status: DC

## 2016-10-01 ENCOUNTER — Other Ambulatory Visit: Payer: Self-pay | Admitting: Cardiovascular Disease

## 2017-03-05 ENCOUNTER — Other Ambulatory Visit
Admission: RE | Admit: 2017-03-05 | Discharge: 2017-03-05 | Disposition: A | Discharge status: Home / Self Care | Payer: Managed Care, Other (non HMO) | Source: Ambulatory Visit | Attending: Gastroenterology | Admitting: Gastroenterology

## 2017-03-05 DIAGNOSIS — R112 Nausea with vomiting, unspecified: Secondary | ICD-10-CM | POA: Insufficient documentation

## 2017-03-05 DIAGNOSIS — R197 Diarrhea, unspecified: Secondary | ICD-10-CM | POA: Insufficient documentation

## 2017-03-05 DIAGNOSIS — R1084 Generalized abdominal pain: Secondary | ICD-10-CM | POA: Diagnosis present

## 2017-03-05 LAB — GASTROINTESTINAL PANEL BY PCR, STOOL (REPLACES STOOL CULTURE)
ADENOVIRUS F40/41: NOT DETECTED
Astrovirus: NOT DETECTED
CRYPTOSPORIDIUM: NOT DETECTED
CYCLOSPORA CAYETANENSIS: NOT DETECTED
Campylobacter species: NOT DETECTED
ENTEROPATHOGENIC E COLI (EPEC): NOT DETECTED
Entamoeba histolytica: NOT DETECTED
Enteroaggregative E coli (EAEC): NOT DETECTED
Enterotoxigenic E coli (ETEC): NOT DETECTED
Giardia lamblia: NOT DETECTED
Norovirus GI/GII: NOT DETECTED
PLESIMONAS SHIGELLOIDES: NOT DETECTED
Rotavirus A: NOT DETECTED
Salmonella species: NOT DETECTED
Sapovirus (I, II, IV, and V): NOT DETECTED
Shiga like toxin producing E coli (STEC): NOT DETECTED
Shigella/Enteroinvasive E coli (EIEC): NOT DETECTED
VIBRIO SPECIES: NOT DETECTED
Vibrio cholerae: NOT DETECTED
YERSINIA ENTEROCOLITICA: NOT DETECTED

## 2017-03-05 LAB — C DIFFICILE QUICK SCREEN W PCR REFLEX
C DIFFICILE (CDIFF) INTERP: NOT DETECTED
C Diff antigen: NEGATIVE
C Diff toxin: NEGATIVE

## 2017-03-25 ENCOUNTER — Ambulatory Visit (INDEPENDENT_AMBULATORY_CARE_PROVIDER_SITE_OTHER)
Admission: RE | Admit: 2017-03-25 | Discharge: 2017-03-25 | Disposition: A | Discharge status: Home / Self Care | Payer: Self-pay | Source: Ambulatory Visit | Attending: Cardiovascular Disease | Admitting: Cardiovascular Disease

## 2017-03-25 DIAGNOSIS — Z8249 Family history of ischemic heart disease and other diseases of the circulatory system: Secondary | ICD-10-CM

## 2017-03-25 DIAGNOSIS — I10 Essential (primary) hypertension: Secondary | ICD-10-CM

## 2017-03-27 ENCOUNTER — Encounter: Payer: Self-pay | Admitting: *Deleted

## 2017-03-27 ENCOUNTER — Telehealth: Payer: Self-pay | Admitting: *Deleted

## 2017-03-27 DIAGNOSIS — E7849 Other hyperlipidemia: Secondary | ICD-10-CM

## 2017-03-27 NOTE — Telephone Encounter (Signed)
Reviewed results and recommendations with patient. Scheduled her for nuclear stress test at [facility] and reviewed all instructions. Will fax all instructions to number she provided at [phone] and instructed her to please give me a call if she should have any further questions. She was appreciative for the call and had no further questions at this time.

## 2017-03-27 NOTE — Telephone Encounter (Signed)
-----   Message from Antonieta Iba, MD sent at 03/26/2017 10:54 PM EDT ----- CT coronary calcium score is VERY high, 99% percentile. Lots of blockage noted We need a myoview, treadmill or lexi And would suggest she let us know about any chest pain sx

## 2017-03-30 ENCOUNTER — Other Ambulatory Visit: Payer: Self-pay | Admitting: Family Medicine

## 2017-03-30 DIAGNOSIS — Z780 Asymptomatic menopausal state: Secondary | ICD-10-CM

## 2017-03-31 ENCOUNTER — Ambulatory Visit
Admission: RE | Admit: 2017-03-31 | Discharge: 2017-03-31 | Disposition: A | Discharge status: Home / Self Care | Payer: Managed Care, Other (non HMO) | Source: Ambulatory Visit | Attending: Cardiovascular Disease | Admitting: Cardiovascular Disease

## 2017-03-31 DIAGNOSIS — E784 Other hyperlipidemia: Secondary | ICD-10-CM | POA: Insufficient documentation

## 2017-03-31 DIAGNOSIS — E7849 Other hyperlipidemia: Secondary | ICD-10-CM

## 2017-03-31 LAB — NM MYOCAR MULTI W/SPECT W/WALL MOTION / EF
CHL CUP RESTING HR STRESS: 88 {beats}/min
CSEPEDS: 1 s
CSEPEW: 7 METS
CSEPPHR: 151 {beats}/min
Exercise duration (min): 5 min
LV dias vol: 74 mL (ref 46–106)
LV sys vol: 20 mL
Percent HR: 98 %
TID: 0.79

## 2017-03-31 MED ORDER — TECHNETIUM TC 99M TETROFOSMIN IV KIT
13.0000 | PACK | Freq: Once | INTRAVENOUS | Status: AC | PRN
  Administered 2017-03-31: 13.71 via INTRAVENOUS

## 2017-03-31 MED ORDER — TECHNETIUM TC 99M TETROFOSMIN IV KIT
33.1230 | PACK | Freq: Once | INTRAVENOUS | Status: AC | PRN
  Administered 2017-03-31: 33.123 via INTRAVENOUS

## 2017-04-02 ENCOUNTER — Other Ambulatory Visit: Payer: Medicare Other

## 2017-05-08 ENCOUNTER — Other Ambulatory Visit: Payer: Self-pay | Admitting: Family Medicine

## 2017-05-08 DIAGNOSIS — Z1231 Encounter for screening mammogram for malignant neoplasm of breast: Secondary | ICD-10-CM

## 2017-06-09 ENCOUNTER — Ambulatory Visit
Admission: RE | Admit: 2017-06-09 | Discharge: 2017-06-09 | Disposition: A | Discharge status: Home / Self Care | Payer: Managed Care, Other (non HMO) | Source: Ambulatory Visit | Attending: Family Medicine | Admitting: Family Medicine

## 2017-06-09 DIAGNOSIS — Z13820 Encounter for screening for osteoporosis: Secondary | ICD-10-CM | POA: Insufficient documentation

## 2017-06-09 DIAGNOSIS — Z1231 Encounter for screening mammogram for malignant neoplasm of breast: Secondary | ICD-10-CM | POA: Insufficient documentation

## 2017-06-09 DIAGNOSIS — Z780 Asymptomatic menopausal state: Secondary | ICD-10-CM | POA: Insufficient documentation

## 2017-06-09 DIAGNOSIS — I10 Essential (primary) hypertension: Secondary | ICD-10-CM | POA: Insufficient documentation

## 2017-06-16 ENCOUNTER — Encounter: Admission: RE | Payer: Self-pay | Source: Ambulatory Visit

## 2017-06-16 ENCOUNTER — Ambulatory Visit
Admission: RE | Admit: 2017-06-16 | Payer: Managed Care, Other (non HMO) | Source: Ambulatory Visit | Admitting: Internal Medicine

## 2017-06-16 SURGERY — EGD (ESOPHAGOGASTRODUODENOSCOPY)
Anesthesia: General

## 2017-07-17 ENCOUNTER — Ambulatory Visit
Admission: RE | Admit: 2017-07-17 | Payer: Managed Care, Other (non HMO) | Source: Ambulatory Visit | Admitting: Gastroenterology

## 2017-07-17 ENCOUNTER — Encounter: Admission: RE | Payer: Self-pay | Source: Ambulatory Visit

## 2017-07-17 SURGERY — ESOPHAGOGASTRODUODENOSCOPY (EGD) WITH PROPOFOL
Anesthesia: General

## 2017-07-18 ENCOUNTER — Emergency Department
Admission: EM | Admit: 2017-07-18 | Discharge: 2017-07-18 | Disposition: A | Discharge status: Home / Self Care | Payer: Managed Care, Other (non HMO) | Attending: Student in an Organized Health Care Education/Training Program | Admitting: Student in an Organized Health Care Education/Training Program

## 2017-07-18 ENCOUNTER — Emergency Department: Payer: Managed Care, Other (non HMO)

## 2017-07-18 DIAGNOSIS — Z7982 Long term (current) use of aspirin: Secondary | ICD-10-CM | POA: Diagnosis not present

## 2017-07-18 DIAGNOSIS — I2699 Other pulmonary embolism without acute cor pulmonale: Secondary | ICD-10-CM | POA: Diagnosis not present

## 2017-07-18 DIAGNOSIS — Z79899 Other long term (current) drug therapy: Secondary | ICD-10-CM | POA: Diagnosis not present

## 2017-07-18 DIAGNOSIS — I10 Essential (primary) hypertension: Secondary | ICD-10-CM | POA: Insufficient documentation

## 2017-07-18 DIAGNOSIS — E119 Type 2 diabetes mellitus without complications: Secondary | ICD-10-CM | POA: Diagnosis not present

## 2017-07-18 DIAGNOSIS — R109 Unspecified abdominal pain: Secondary | ICD-10-CM

## 2017-07-18 DIAGNOSIS — R0789 Other chest pain: Secondary | ICD-10-CM | POA: Diagnosis present

## 2017-07-18 HISTORY — DX: Type 2 diabetes mellitus without complications: E11.9

## 2017-07-18 LAB — CBC
HCT: 41 % (ref 35.0–47.0)
HEMOGLOBIN: 13.6 g/dL (ref 12.0–16.0)
MCH: 30.2 pg (ref 26.0–34.0)
MCHC: 33.2 g/dL (ref 32.0–36.0)
MCV: 91 fL (ref 80.0–100.0)
PLATELETS: 279 10*3/uL (ref 150–440)
RBC: 4.51 MIL/uL (ref 3.80–5.20)
RDW: 14.2 % (ref 11.5–14.5)
WBC: 10.9 10*3/uL (ref 3.6–11.0)

## 2017-07-18 LAB — BRAIN NATRIURETIC PEPTIDE: B Natriuretic Peptide: 9 pg/mL (ref 0.0–100.0)

## 2017-07-18 LAB — BASIC METABOLIC PANEL
ANION GAP: 11 (ref 5–15)
BUN: 17 mg/dL (ref 6–20)
CALCIUM: 10 mg/dL (ref 8.9–10.3)
CO2: 23 mmol/L (ref 22–32)
CREATININE: 0.68 mg/dL (ref 0.44–1.00)
Chloride: 100 mmol/L — ABNORMAL LOW (ref 101–111)
GLUCOSE: 134 mg/dL — AB (ref 65–99)
Potassium: 4.3 mmol/L (ref 3.5–5.1)
Sodium: 134 mmol/L — ABNORMAL LOW (ref 135–145)

## 2017-07-18 LAB — FIBRIN DERIVATIVES D-DIMER (ARMC ONLY): FIBRIN DERIVATIVES D-DIMER (ARMC): 2012.52 ng{FEU}/mL — AB (ref 0.00–499.00)

## 2017-07-18 LAB — TROPONIN I

## 2017-07-18 MED ORDER — LIDOCAINE 5 % EX PTCH
1.0000 | MEDICATED_PATCH | CUTANEOUS | Status: DC
  Administered 2017-07-18: 1 via TRANSDERMAL
  Filled 2017-07-18: qty 1

## 2017-07-18 MED ORDER — LIDOCAINE 5 % EX PTCH: 1.0000 | MEDICATED_PATCH | Freq: Two times a day (BID) | CUTANEOUS | 0 refills | Status: AC

## 2017-07-18 MED ORDER — APIXABAN 5 MG PO TABS: 5.0000 mg | ORAL_TABLET | Freq: Two times a day (BID) | ORAL | 1 refills | Status: DC

## 2017-07-18 MED ORDER — OXYCODONE-ACETAMINOPHEN 5-325 MG PO TABS
1.0000 | ORAL_TABLET | Freq: Once | ORAL | Status: AC
  Administered 2017-07-18: 1 via ORAL
  Filled 2017-07-18: qty 1

## 2017-07-18 MED ORDER — ONDANSETRON HCL 4 MG/2ML IJ SOLN
4.0000 mg | Freq: Once | INTRAMUSCULAR | Status: AC
  Administered 2017-07-18: 4 mg via INTRAVENOUS

## 2017-07-18 MED ORDER — SODIUM CHLORIDE 0.9 % IV BOLUS (SEPSIS)
500.0000 mL | Freq: Once | INTRAVENOUS | Status: AC
  Administered 2017-07-18: 500 mL via INTRAVENOUS

## 2017-07-18 MED ORDER — ONDANSETRON HCL 4 MG/2ML IJ SOLN
INTRAMUSCULAR | Status: AC
  Administered 2017-07-18: 4 mg via INTRAVENOUS
  Filled 2017-07-18: qty 2

## 2017-07-18 MED ORDER — IOPAMIDOL (ISOVUE-370) INJECTION 76%
75.0000 mL | Freq: Once | INTRAVENOUS | Status: AC | PRN
  Administered 2017-07-18: 75 mL via INTRAVENOUS

## 2017-07-18 MED ORDER — MORPHINE SULFATE (PF) 2 MG/ML IV SOLN
INTRAVENOUS | Status: AC
  Administered 2017-07-18: 2 mg via INTRAVENOUS
  Filled 2017-07-18: qty 1

## 2017-07-18 MED ORDER — APIXABAN 5 MG PO TABS
10.0000 mg | ORAL_TABLET | Freq: Two times a day (BID) | ORAL | Status: DC
  Administered 2017-07-18: 10 mg via ORAL
  Filled 2017-07-18: qty 2

## 2017-07-18 MED ORDER — MORPHINE SULFATE (PF) 2 MG/ML IV SOLN
2.0000 mg | Freq: Once | INTRAVENOUS | Status: AC
  Administered 2017-07-18: 2 mg via INTRAVENOUS

## 2017-07-18 NOTE — ED Notes (Signed)
Spoke to Dr. Lamont Snowball about elevated d-dimer, he stated due to recent surgery test result was  insignificant and patient did not need to be rushed back from waiting area.

## 2017-07-18 NOTE — ED Triage Notes (Signed)
Per EMS. Patient c/o right flank pain. Patient reports that she had surgery on right foot at [facility] 3 weeks ago. Patient has been stationary in chair since surgery. Vital signs WNL per EMS. Lung sounds clear per EMS

## 2017-07-18 NOTE — ED Provider Notes (Signed)
[facility] Emergency Department Provider Note    First MD Initiated Contact with Patient 07/18/17 [phone]     (approximate)  I have reviewed the triage vital signs and the nursing notes.   HISTORY  Chief Complaint Back Pain (right)    HPI Nyoka LintConnie J Fehnel is a 66 y.o. female status post recent orthopedic surgery to the left foot presents to the ER with chief complaint of sudden onset right flank pain and pleuritic chest pain.  Pain was initially noted to right low back she has been sitting in a recliner keeping her leg elevated.  States that in the late night last night started having difficulty taking a deep breath secondary to the pain.  Has had a few episodes of coughing.  No hemoptysis.  No leg swelling.  No fevers.  No productive cough.  Denies any abdominal pain.  No nausea vomiting diarrhea.  She is taking 325 mg aspirin daily.  No personal history of blood clots.  Does have a remote history of pyelonephritis but no history of stones.  Past Medical History:  Diagnosis Date  . Anxiety   . Arthritis   . Diabetes mellitus without complication (HCC)   . Diffuse cystic mastopathy 2012  . GERD (gastroesophageal reflux disease)   . Heart murmur    ETT myoview 12/09 (Dr. Juel BurrowMasoud) no evidence of ischemia, good exercise tolerance - 9 min. echo (11/10): EF 60-65%, nml RV, PASP 25, nml valves  . Hepatitis    as a child   . HLD (hyperlipidemia)   . HTN (hypertension)    for < 10 years  . Obesity   . OSA (obstructive sleep apnea)    mild; uses CPAP  . Rheumatic fever    as a child   Family History  Problem Relation Age of Onset  . Diabetes Father   . Hypertension Father   . Other Father        PPM  . Multiple sclerosis Mother   . Hyperlipidemia Mother   . Heart attack Sister        LATE 30'S, 40  . Aneurysm Brother        brain  . Hypertension Sister   . Hyperlipidemia Sister   . Breast cancer Neg Hx    Past Surgical History:  Procedure Laterality  Date  . arthroscopic surgery     L elbow   . BREAST BIOPSY Left    needle bx-neg  . carpael tunnel release     R hand  . COLONOSCOPY  2008   Dr. Sharen HintSiegal  . reconstruction foot and bunion surgery - both feet    . TONSILLECTOMY    . TOTAL ABDOMINAL HYSTERECTOMY  1994   Patient Active Problem List   Diagnosis Date Noted  . Controlled type 2 diabetes mellitus without complication, without long-term current use of insulin (HCC) 08/08/2016  . Headache 07/22/2013  . Fibrocystic breast 11/09/2012  . Obese 09/02/2011  . Insomnia 09/02/2011  . OSA on CPAP 09/02/2011  . OTHER MALAISE AND FATIGUE 09/11/2010  . Hyperlipidemia 05/22/2009  . Essential hypertension 05/22/2009  . CHEST PAIN-UNSPECIFIED 05/22/2009  . RHEUMATIC FEVER, HX OF 05/22/2009      Prior to Admission medications   Medication Sig Start Date End Date Taking? Authorizing Provider  apixaban (ELIQUIS) 5 MG TABS tablet Take 1 tablet (5 mg total) by mouth 2 (two) times daily. 07/18/17   Willy Eddyobinson, Keltie Labell, MD  aspirin 81 MG EC tablet Take 81 mg by  mouth daily.      [provider]  cetirizine (ZYRTEC) 10 MG tablet Take 10 mg by mouth daily as needed for allergies.    [provider]  CHROMIUM PO Take by mouth daily.    [provider]  cloNIDine (CATAPRES) 0.1 MG tablet TAKE 1 TABLET BY MOUTH TWICE DAILY 10/01/16   Antonieta Iba, MD  ezetimibe (ZETIA) 10 MG tablet TAKE 1 TABLET BY MOUTH DAILY 10/01/16   Antonieta Iba, MD  furosemide (LASIX) 20 MG tablet Take 1 tablet (20 mg total) by mouth daily as needed. 08/08/14   Antonieta Iba, MD  hydrALAZINE (APRESOLINE) 50 MG tablet Take 50 mg by mouth 3 (three) times daily as needed.    [provider]  lidocaine (LIDODERM) 5 % Place 1 patch onto the skin every 12 (twelve) hours. Remove & Discard patch within 12 hours or as directed by MD 07/18/17 07/18/18  Willy Eddy, MD  lisinopril (PRINIVIL,ZESTRIL) 40 MG tablet Take 0.5 tablets (20  mg total) by mouth daily. 08/12/16   Antonieta Iba, MD  metFORMIN (GLUCOPHAGE-XR) 500 MG 24 hr tablet Take 500 mg by mouth daily with breakfast.  05/22/15   [provider]  montelukast (SINGULAIR) 10 MG tablet Take 10 mg by mouth daily as needed.    [provider]  pantoprazole (PROTONIX) 40 MG tablet Take 40 mg by mouth daily.      [provider]  potassium chloride (K-DUR) 10 MEQ tablet Take 10 mEq by mouth daily as needed.    [provider]  rosuvastatin (CRESTOR) 40 MG tablet TAKE 1 TABLET BY MOUTH DAILY 10/01/16   Antonieta Iba, MD    Allergies Atorvastatin    Social History Social History   Tobacco Use  . Smoking status: Never Smoker  . Smokeless tobacco: Never Used  . Tobacco comment: tobacco use - no  Substance Use Topics  . Alcohol use: Yes    Comment: social   . Drug use: No    Review of Systems Patient denies headaches, rhinorrhea, blurry vision, numbness, shortness of breath, chest pain, edema, cough, abdominal pain, nausea, vomiting, diarrhea, dysuria, fevers, rashes or hallucinations unless otherwise stated above in HPI. ____________________________________________   PHYSICAL EXAM:  VITAL SIGNS: Vitals:   07/18/17 1015 07/18/17 1030  BP:  109/61  Pulse: 85 86  Resp: 16 (!) 22  Temp:    SpO2: 97% 98%    Constitutional: Alert and oriented. Well appearing and in no acute distress. Eyes: Conjunctivae are normal.  Head: Atraumatic. Nose: No congestion/rhinnorhea. Mouth/Throat: Mucous membranes are moist.   Neck: No stridor. Painless ROM.  Cardiovascular: Normal rate, regular rhythm. Grossly normal heart sounds.  Good peripheral circulation. Respiratory: Normal respiratory effort.  No retractions. Lungs CTAB. Gastrointestinal: Soft and nontender. No distention. No abdominal bruits. No CVA tenderness. Genitourinary:  Musculoskeletal: LLE in cast, No lower extremity tenderness nor edema.  No joint  effusions. Neurologic:  Normal speech and language. No gross focal neurologic deficits are appreciated. No facial droop Skin:  Skin is warm, dry and intact. No rash noted. Psychiatric: Mood and affect are normal. Speech and behavior are normal.  ____________________________________________   LABS (all labs ordered are listed, but only abnormal results are displayed)  Results for orders placed or performed during the hospital encounter of 07/18/17 (from the past 24 hour(s))  Basic metabolic panel     Status: Abnormal   Collection Time: 07/18/17  4:46 AM  Result Value Ref  Range   Sodium 134 (L) 135 - 145 mmol/L   Potassium 4.3 3.5 - 5.1 mmol/L   Chloride 100 (L) 101 - 111 mmol/L   CO2 23 22 - 32 mmol/L   Glucose, Bld 134 (H) 65 - 99 mg/dL   BUN 17 6 - 20 mg/dL   Creatinine, Ser 7.51 0.44 - 1.00 mg/dL   Calcium 70.0 8.9 - 17.4 mg/dL   GFR calc non Af Amer >60 >60 mL/min   GFR calc Af Amer >60 >60 mL/min   Anion gap 11 5 - 15  CBC     Status: None   Collection Time: 07/18/17  4:46 AM  Result Value Ref Range   WBC 10.9 3.6 - 11.0 K/uL   RBC 4.51 3.80 - 5.20 MIL/uL   Hemoglobin 13.6 12.0 - 16.0 g/dL   HCT 94.4 96.7 - 59.1 %   MCV 91.0 80.0 - 100.0 fL   MCH 30.2 26.0 - 34.0 pg   MCHC 33.2 32.0 - 36.0 g/dL   RDW 63.8 46.6 - 59.9 %   Platelets 279 150 - 440 K/uL  Troponin I     Status: None   Collection Time: 07/18/17  4:46 AM  Result Value Ref Range   Troponin I <0.03 <0.03 ng/mL  Fibrin derivatives D-Dimer ([facility] only)     Status: Abnormal   Collection Time: 07/18/17  4:46 AM  Result Value Ref Range   Fibrin derivatives D-dimer ([facility]) 2,012.52 (H) 0.00 - 499.00 ng/mL (FEU)  Brain natriuretic peptide     Status: None   Collection Time: 07/18/17  4:46 AM  Result Value Ref Range   B Natriuretic Peptide 9.0 0.0 - 100.0 pg/mL   ____________________________________________  EKG My review and personal interpretation at Time:   4:55 Indication: chest pain  Rate: 105  Rhythm:  sinus Axis: normal Other: normal intervals, no stemi,  ____________________________________________  RADIOLOGY  I personally reviewed all radiographic images ordered to evaluate for the above acute complaints and reviewed radiology reports and findings.  These findings were personally discussed with the patient.  Please see medical record for radiology report.  ____________________________________________   PROCEDURES  Procedure(s) performed:  Procedures    Critical Care performed: no ____________________________________________   INITIAL IMPRESSION / ASSESSMENT AND PLAN / ED COURSE  Pertinent labs & imaging results that were available during my care of the patient were reviewed by me and considered in my medical decision making (see chart for details).  DDX: ACS, pericarditis, esophagitis, boerhaaves, pe, dissection, pna, bronchitis, costochondritis, stone, pyelo, msk strain   ANDRA BELOUS is a 66 y.o. who presents to the ED with right flank pain status post Driscilla Grammes procedure as described above.  She is Heema dynamically stable and in no acute distress.  Patient at increased risk for PE therefore d-dimer was ordered which was elevated.  No evidence of ACS.  Abdominal exam is soft and benign.  May also be kidney stone therefore CT imaging of chest as well as renal study ordered to further differentiate.  Will provide IV fluids as well as pain medication.  Clinical Course as of Jul 19 1147  Sat Jul 18, 2017  0840 Patient does have evidence of subsegmental right lower lobe PE which explain the patient's presentation.  Will give Eliquis.  Troponin is negative but will further risk stratify with BNP.  She has no hypoxia or tachycardia.  [PR]  [id number] Troponin and BNP are normal.  Patient tolerating oral medications.  Still without  any tachycardia or hypoxia.  Based on her presentation I do anticipate patient will be stable and appropriate for discharge home on Eliquis.   Discussed strict  return precautions and risks/benefits of anticoagulation with patient.  Have discussed with the patient and available family all diagnostics and treatments performed thus far and all questions were answered to the best of my ability. The patient demonstrates understanding and agreement with plan.   [PR]    Clinical Course User Index [PR] Willy Eddy, MD     ____________________________________________   FINAL CLINICAL IMPRESSION(S) / ED DIAGNOSES  Final diagnoses:  Pulmonary embolus, right (HCC)  Flank pain, acute      NEW MEDICATIONS STARTED DURING THIS VISIT:  This SmartLink is deprecated. Use AVSMEDLIST instead to display the medication list for a patient.   Note:  This document was prepared using Dragon voice recognition software and may include unintentional dictation errors.    Willy Eddy, MD 07/18/17 [phone]

## 2017-07-18 NOTE — ED Notes (Signed)
Pt updated on delay. Pt verbalizes understanding. Pt sitting in subwait with foot propped. No resp distress noted.

## 2017-07-18 NOTE — ED Triage Notes (Signed)
Patient denies right flank pain. Patient reports right upper back pain radiating towards chest.

## 2017-07-20 ENCOUNTER — Telehealth: Payer: Self-pay | Admitting: Cardiovascular Disease

## 2017-07-20 NOTE — Telephone Encounter (Signed)
Pt states she was in the ED on Saturday with a blood clot. States she was started on Eliquis, and ever since then has had a dull headache, states her BP has been elevated since then. Today 186/95, just now, earlier today 166/87. States she has been taking Tylenol for her headache. Please call to advise if this is normal.

## 2017-07-20 NOTE — Telephone Encounter (Signed)
I spoke with the patient.  She was seen in the ER on 07/18/17 s/p orthopedic surgery with right flank & pleuritic chest pain & + for PE. She was started on eliquis 5 mg- take 2 tablets (10 mg) BID x 7 days, then 1 tablet (5 mg) BID. She reports a dull headache that started on Sunday and has continued through to today.  She did not take her BP yesterday, but today she had readings of 166/87 around noon & 186/95 around 4 pm.  Tylenol has not helped her headache. She has oxycodone for her foot, but has not needed this.   She currently takes lisinopril 40 mg- 1/2 tablet (20 mg) once daily & clonidine 0.1 mg BID. Reviewed with Dr. Mariah Milling. Orders received to have the patient increase lisinopril to 40 mg once daily (she typically takes this at night) and if this does not help, she can increase clonidine to 0.2 mg BID.  If headache persists or worsens significantly, she should report to the ER for further evaluation and possible head CT.   I have notified the patient of the above recommendations and she voices understanding.  I have advised her if her headache resolves and her BP normalizes (100-120/80 is where she usually is), then she can resume current doses of lisinopril/ clonidine.  She is agreeable.

## 2017-07-22 ENCOUNTER — Ambulatory Visit (INDEPENDENT_AMBULATORY_CARE_PROVIDER_SITE_OTHER): Payer: Managed Care, Other (non HMO) | Admitting: Cardiovascular Disease

## 2017-07-22 ENCOUNTER — Encounter: Payer: Self-pay | Admitting: Cardiovascular Disease

## 2017-07-22 VITALS — BP 134/88 | HR 85 | Ht 64.0 in | Wt 216.0 lb

## 2017-07-22 DIAGNOSIS — I10 Essential (primary) hypertension: Secondary | ICD-10-CM

## 2017-07-22 DIAGNOSIS — G4733 Obstructive sleep apnea (adult) (pediatric): Secondary | ICD-10-CM | POA: Diagnosis not present

## 2017-07-22 DIAGNOSIS — I2699 Other pulmonary embolism without acute cor pulmonale: Secondary | ICD-10-CM | POA: Diagnosis not present

## 2017-07-22 DIAGNOSIS — I82449 Acute embolism and thrombosis of unspecified tibial vein: Secondary | ICD-10-CM

## 2017-07-22 DIAGNOSIS — I82409 Acute embolism and thrombosis of unspecified deep veins of unspecified lower extremity: Secondary | ICD-10-CM | POA: Insufficient documentation

## 2017-07-22 DIAGNOSIS — E7849 Other hyperlipidemia: Secondary | ICD-10-CM | POA: Diagnosis not present

## 2017-07-22 DIAGNOSIS — E119 Type 2 diabetes mellitus without complications: Secondary | ICD-10-CM

## 2017-07-22 DIAGNOSIS — Z9989 Dependence on other enabling machines and devices: Secondary | ICD-10-CM | POA: Diagnosis not present

## 2017-07-22 MED ORDER — LISINOPRIL 40 MG PO TABS: 40.0000 mg | ORAL_TABLET | Freq: Every day | ORAL | 3 refills | Status: DC

## 2017-07-22 MED ORDER — CLONIDINE HCL 0.2 MG PO TABS: 0.2000 mg | ORAL_TABLET | Freq: Two times a day (BID) | ORAL | 3 refills | Status: DC

## 2017-07-22 NOTE — Patient Instructions (Addendum)
Medication Instructions:   No medication changes made  Labwork:  No new labs needed  Testing/Procedures:  Lower extremity venous doppler left leg in 3 months, Follow up of DVT and pulmonary embolism   Follow-Up: It was a pleasure seeing you in the office today. Please call us if you have new issues that need to be addressed before your next appt.  [phone]  Your physician wants you to follow-up in: 3 months after ultrasound.  You will receive a reminder letter in the mail two months in advance. If you don't receive a letter, please call our office to schedule the follow-up appointment.  If you need a refill on your cardiac medications before your next appointment, please call your pharmacy.

## 2017-07-22 NOTE — Progress Notes (Signed)
Cardiology Office Note  Date:  07/22/2017   ID:  JIZEL CHEEKS, DOB [birth year], MRN [id number]  PCP:  Leim Fabry, MD   Chief Complaint  Patient presents with  . other    Heart flutter and elevated BP. Meds reviewed verbally with pt.    HPI:  67 yo woman with history of  HTN,  hyperlipidemia,  obesity,  Type 2 diabetes,  Obstructive sleep apnea who wears CPAP,   family history of CAD (sister was smoker with MI and CABG)  comes for followup of her hyperlipidemia and hypertension, recent diagnosis of pulmonary embolism in the setting of cast below the knee on her left leg No prior smoking history  Had foot surgery on the left, Developed shortness of breath chest discomfort, presented to the emergency room July 18, 2017 CT scan confirming pulmonary embolism right lower lobe, Hospital records reviewed with the patient in detail She has a cast in place, has been very sedentary at home since the Date of surgery 11/30 Cast scheduled to come off 08/03/17.  Followed at [facility]  Blood pressure running high We did recommend that she double her clonidine up to 0.2 mg twice daily, increase lisinopril up to 40 mg daily Blood pressure improved Sometimes will run high when she has a headache She has some shortness of breath at nighttime, when laying supine Occasional palpitations  hemoglobin A1c 6.0 total cholesterol 132, LDL 44, fasting glucose 159 Date of lab work November 2017  EKG on today's visit shows normal sinus rhythm with rate 85 bpm, no significant ST or T-wave changes   Other past medical history  She had leg edema 07/19/2013 and took Lasix. December 31 had high blood pressure, neck pain, headache. She went to the emergency room and reports systolic pressure was more than 200. She was given extra clonidine. She went home and slept on new years day.   Previous blood work showed low sodium, low potassium on HCTZ. This was discontinued   sister has had bypass  surgery  Previous cholesterol 198, LDL 87, triglycerides 300, HDL 52, hemoglobin A1c 5.8 The patient has never smoked.   Lab work from the emergency room 07/20/2013 was essentially normal. Cardiac enzymes negative, CBC basic metabolic panel normal Chest x-ray normal    PMH:   has a past medical history of Anxiety, Arthritis, Diabetes mellitus without complication (HCC), Diffuse cystic mastopathy (2012), GERD (gastroesophageal reflux disease), Heart murmur, Hepatitis, HLD (hyperlipidemia), HTN (hypertension), Obesity, OSA (obstructive sleep apnea), and Rheumatic fever.  PSH:    Past Surgical History:  Procedure Laterality Date  . arthroscopic surgery     L elbow   . BREAST BIOPSY Left    needle bx-neg  . carpael tunnel release     R hand  . COLONOSCOPY  2008   Dr. Sharen Hint  . FOOT SURGERY Left   . reconstruction foot and bunion surgery - both feet    . TONSILLECTOMY    . TOTAL ABDOMINAL HYSTERECTOMY  1994    Current Outpatient Medications  Medication Sig Dispense Refill  . apixaban (ELIQUIS) 5 MG TABS tablet Take 1 tablet (5 mg total) by mouth 2 (two) times daily. 74 tablet 1  . aspirin 81 MG EC tablet Take 81 mg by mouth daily.      . cetirizine (ZYRTEC) 10 MG tablet Take 10 mg by mouth daily as needed for allergies.    . cloNIDine (CATAPRES) 0.2 MG tablet Take 1 tablet (0.2 mg total) by mouth 2 (two)  times daily. 180 tablet 3  . ezetimibe (ZETIA) 10 MG tablet TAKE 1 TABLET BY MOUTH DAILY 90 tablet 3  . furosemide (LASIX) 20 MG tablet Take 1 tablet (20 mg total) by mouth daily as needed. 30 tablet 3  . hydrALAZINE (APRESOLINE) 50 MG tablet Take 50 mg by mouth 3 (three) times daily as needed.    . lidocaine (LIDODERM) 5 % Place 1 patch onto the skin every 12 (twelve) hours. Remove & Discard patch within 12 hours or as directed by MD 10 patch 0  . lisinopril (PRINIVIL,ZESTRIL) 40 MG tablet Take 1 tablet (40 mg total) by mouth daily. 90 tablet 3  . metFORMIN (GLUCOPHAGE-XR) 500 MG  24 hr tablet Take 500 mg by mouth daily with breakfast.     . montelukast (SINGULAIR) 10 MG tablet Take 10 mg by mouth daily as needed.    Marland Kitchen oxyCODONE (OXY IR/ROXICODONE) 5 MG immediate release tablet Take 5 mg by mouth every 4 (four) hours as needed for severe pain.    . pantoprazole (PROTONIX) 40 MG tablet Take 40 mg by mouth daily.      . potassium chloride (K-DUR) 10 MEQ tablet Take 10 mEq by mouth daily as needed.    . rosuvastatin (CRESTOR) 40 MG tablet TAKE 1 TABLET BY MOUTH DAILY 90 tablet 3   No current facility-administered medications for this visit.      Allergies:   Atorvastatin   Social History:  The patient  reports that  has never smoked. she has never used smokeless tobacco. She reports that she drinks alcohol. She reports that she does not use drugs.   Family History:   family history includes Aneurysm in her brother; Diabetes in her father; Heart attack in her sister; Hyperlipidemia in her mother and sister; Hypertension in her father and sister; Multiple sclerosis in her mother; Other in her father.    Review of Systems: Review of Systems  Constitutional: Negative.   Respiratory: Positive for shortness of breath.   Cardiovascular: Positive for palpitations.  Gastrointestinal: Negative.   Musculoskeletal: Negative.   Neurological: Negative.   Psychiatric/Behavioral: Negative.   All other systems reviewed and are negative.    PHYSICAL EXAM: VS:  BP 134/88 (BP Location: Right Arm, Patient Position: Sitting, Cuff Size: Large)   Pulse 85   Ht 5\' 4"  (1.626 m)   Wt 216 lb (98 kg)   BMI 37.08 kg/m  , BMI Body mass index is 37.08 kg/m. GEN: Well nourished, well developed, in no acute distress  HEENT: normal  Neck: no JVD, carotid bruits, or masses Cardiac: RRR; no murmurs, rubs, or gallops,no edema  Respiratory:  clear to auscultation bilaterally, normal work of breathing GI: soft, nontender, nondistended, + BS MS: no deformity or atrophy , left lower extremity  cast below the knee Skin: warm and dry, no rash Neuro:  Strength and sensation are intact Psych: euthymic mood, full affect    Recent Labs: 07/18/2017: B Natriuretic Peptide 9.0; BUN 17; Creatinine, Ser 0.68; Hemoglobin 13.6; Platelets 279; Potassium 4.3; Sodium 134    Lipid Panel Lab Results  Component Value Date   CHOL 158 11/15/2015   HDL 69 11/15/2015   LDLCALC 58 11/15/2015   TRIG 153 (H) 11/15/2015      Wt Readings from Last 3 Encounters:  07/22/17 216 lb (98 kg)  07/18/17 216 lb (98 kg)  08/08/16 216 lb (98 kg)       ASSESSMENT AND PLAN:  Essential hypertension - For now we  will continue clonidine 0.2 mg twice daily with lisinopril 40 daily  OSA on CPAP Compliant with her CPAP  Pulmonary embolism Documented on CT scan December 2018 Will need at least 3 months of anticoagulation Currently on Eliquis 10 twice daily for 1 week then down to 5 twice daily Consider ultrasound left lower extremity after cast/and walking cast has been removed, approximately 3 months  Controlled type 2 diabetes mellitus without complication, without long-term current use of insulin (HCC) Diet discussed with her Poor diet at baseline  Hyperlipidemia Cholesterol is at goal on the current lipid regimen. No changes to the medications were made. Continue Crestor 40 mg daily   Total encounter time more than 25 minutes  Greater than 50% was spent in counseling and coordination of care with the patient   Disposition:   F/U  3 months   Orders Placed This Encounter  Procedures  . EKG 12-Lead     Signed, Dossie Arbour, M.D., Ph.D. 07/22/2017  [facility] [place], [place] [phone]

## 2017-08-11 ENCOUNTER — Ambulatory Visit: Payer: Medicare Other | Admitting: Cardiovascular Disease

## 2017-09-05 ENCOUNTER — Other Ambulatory Visit: Payer: Self-pay | Admitting: Cardiovascular Disease

## 2017-09-06 ENCOUNTER — Encounter: Payer: Self-pay | Admitting: Cardiovascular Disease

## 2017-09-07 ENCOUNTER — Other Ambulatory Visit: Payer: Self-pay | Admitting: *Deleted

## 2017-09-07 MED ORDER — APIXABAN 5 MG PO TABS: 5.0000 mg | ORAL_TABLET | Freq: Two times a day (BID) | ORAL | 6 refills | Status: DC

## 2017-10-19 ENCOUNTER — Ambulatory Visit (INDEPENDENT_AMBULATORY_CARE_PROVIDER_SITE_OTHER): Payer: Managed Care, Other (non HMO)

## 2017-10-19 DIAGNOSIS — I82449 Acute embolism and thrombosis of unspecified tibial vein: Secondary | ICD-10-CM | POA: Diagnosis not present

## 2017-10-19 DIAGNOSIS — I2699 Other pulmonary embolism without acute cor pulmonale: Secondary | ICD-10-CM

## 2017-10-24 NOTE — Progress Notes (Signed)
Cardiology Office Note  Date:  10/27/2017   ID:  ZANDER HAMOR, DOB 05-15-1951, MRN [id number]  PCP:  Leim Fabry, MD   Chief Complaint  Patient presents with  . other    3 month f/u no complaints today. Meds reviewed verbally with pt.    HPI:  67 yo woman with history of  HTN,  hyperlipidemia,  obesity,  Type 2 diabetes,  Obstructive sleep apnea who wears CPAP,   family history of CAD (sister was smoker with MI and CABG)  comes for followup of her hyperlipidemia and hypertension, recent diagnosis of pulmonary embolism in the setting of cast below the knee on her left leg No prior smoking history  Had foot surgery on the left, Developed shortness of breath chest discomfort, presented to the emergency room July 18, 2017 CT scan confirming pulmonary embolism right lower lobe,  cast in place,  surgery 11/30 Cast off 08/03/17.  Followed at [facility]  Reports that she is still doing physical therapy,  Sedentary, Does some walking but not regular exercise Right lower extremity with swelling Not wearing compression hose  Through all of the above has had poor appetite, weight loss 10 pounds Has noticed blood pressure has been declining Previously we doubled her clonidine up to 0.2 mg twice daily, increase lisinopril up to 40 mg daily For high blood pressure  Now blood pressure running lower Wonders if she can cut down on her pills  Lab work reviewed hemoglobin A1c 6.0 total cholesterol 132, LDL 44, fasting glucose 159 Date of lab work November 2017  EKG personally reviewed by myself on todays visit  shows normal sinus rhythm rate 70 bpm left axis deviation no significant ST or T wave changes  Other past medical history  She had leg edema 07/19/2013 and took Lasix. December 31 had high blood pressure, neck pain, headache. She went to the emergency room and reports systolic pressure was more than 200. She was given extra clonidine. She went home and slept on new years  day.   Previous blood work showed low sodium, low potassium on HCTZ. This was discontinued   sister has had bypass surgery  Previous cholesterol 198, LDL 87, triglycerides 300, HDL 52, hemoglobin A1c 5.8 The patient has never smoked.   Lab work from the emergency room 07/20/2013 was essentially normal. Cardiac enzymes negative, CBC basic metabolic panel normal Chest x-ray normal    PMH:   has a past medical history of Anxiety, Arthritis, Diabetes mellitus without complication (HCC), Diffuse cystic mastopathy (2012), GERD (gastroesophageal reflux disease), Heart murmur, Hepatitis, HLD (hyperlipidemia), HTN (hypertension), Obesity, OSA (obstructive sleep apnea), and Rheumatic fever.  PSH:    Past Surgical History:  Procedure Laterality Date  . arthroscopic surgery     L elbow   . BREAST BIOPSY Left    needle bx-neg  . carpael tunnel release     R hand  . COLONOSCOPY  2008   Dr. Sharen Hint  . FOOT SURGERY Left   . reconstruction foot and bunion surgery - both feet    . TONSILLECTOMY    . TOTAL ABDOMINAL HYSTERECTOMY  1994    Current Outpatient Medications  Medication Sig Dispense Refill  . apixaban (ELIQUIS) 5 MG TABS tablet Take 1 tablet (5 mg total) by mouth 2 (two) times daily. 60 tablet 6  . cetirizine (ZYRTEC) 10 MG tablet Take 10 mg by mouth daily as needed for allergies.    . cloNIDine (CATAPRES) 0.2 MG tablet Take 1 tablet (0.2  mg total) by mouth 2 (two) times daily. 180 tablet 3  . ezetimibe (ZETIA) 10 MG tablet TAKE 1 TABLET BY MOUTH DAILY 90 tablet 3  . furosemide (LASIX) 20 MG tablet Take 1 tablet (20 mg total) by mouth daily as needed. 30 tablet 3  . hydrALAZINE (APRESOLINE) 50 MG tablet Take 50 mg by mouth 3 (three) times daily as needed.    . lidocaine (LIDODERM) 5 % Place 1 patch onto the skin every 12 (twelve) hours. Remove & Discard patch within 12 hours or as directed by MD 10 patch 0  . lisinopril (PRINIVIL,ZESTRIL) 40 MG tablet Take 1 tablet (40 mg total) by  mouth daily. 90 tablet 3  . metFORMIN (GLUCOPHAGE-XR) 500 MG 24 hr tablet Take 500 mg by mouth daily with breakfast.     . montelukast (SINGULAIR) 10 MG tablet Take 10 mg by mouth daily as needed.    . pantoprazole (PROTONIX) 40 MG tablet Take 40 mg by mouth daily.      . potassium chloride (K-DUR) 10 MEQ tablet Take 10 mEq by mouth daily as needed.    . rosuvastatin (CRESTOR) 40 MG tablet TAKE 1 TABLET BY MOUTH DAILY 90 tablet 3   No current facility-administered medications for this visit.      Allergies:   Atorvastatin   Social History:  The patient  reports that she has never smoked. She has never used smokeless tobacco. She reports that she drinks alcohol. She reports that she does not use drugs.   Family History:   family history includes Aneurysm in her brother; Diabetes in her father; Heart attack in her sister; Hyperlipidemia in her mother and sister; Hypertension in her father and sister; Multiple sclerosis in her mother; Other in her father.    Review of Systems: Review of Systems  Constitutional: Negative.   Respiratory: Positive for shortness of breath.   Cardiovascular: Positive for leg swelling.  Gastrointestinal: Negative.   Musculoskeletal: Negative.   Neurological: Negative.   Psychiatric/Behavioral: Negative.   All other systems reviewed and are negative.    PHYSICAL EXAM: VS:  BP 108/70 (BP Location: Left Arm, Patient Position: Sitting, Cuff Size: Large)   Pulse 70   Ht 5\' 4"  (1.626 m)   Wt 207 lb 4 oz (94 kg)   BMI 35.57 kg/m  , BMI Body mass index is 35.57 kg/m. Constitutional:  oriented to person, place, and time. No distress. Obese HENT:  Head: Normocephalic and atraumatic.  Eyes:  no discharge. No scleral icterus.  Neck: Normal range of motion. Neck supple. No JVD present.  Cardiovascular: Normal rate, regular rhythm, normal heart sounds and intact distal pulses. Exam reveals no gallop and no friction rub.  mild nonpitting lower extremity edema  right lower extremity No murmur heard. Pulmonary/Chest: Effort normal and breath sounds normal. No stridor. No respiratory distress.  no wheezes.  no rales.  no tenderness.  Abdominal: Soft.  no distension.  no tenderness.  Musculoskeletal: Normal range of motion.  no  tenderness or deformity.  Neurological:  normal muscle tone. Coordination normal. No atrophy Skin: Skin is warm and dry. No rash noted. not diaphoretic.  Psychiatric:  normal mood and affect. behavior is normal. Thought content normal.    Recent Labs: 07/18/2017: B Natriuretic Peptide 9.0; BUN 17; Creatinine, Ser 0.68; Hemoglobin 13.6; Platelets 279; Potassium 4.3; Sodium 134    Lipid Panel Lab Results  Component Value Date   CHOL 158 11/15/2015   HDL 69 11/15/2015   LDLCALC 58  11/15/2015   TRIG 153 (H) 11/15/2015      Wt Readings from Last 3 Encounters:  10/27/17 207 lb 4 oz (94 kg)  07/22/17 216 lb (98 kg)  07/18/17 216 lb (98 kg)       ASSESSMENT AND PLAN:  Essential hypertension - Recommended she decrease clonidine down to 0.1 mg's twice daily with lisinopril 40 daily If blood pressure continues to run low we would decrease lisinopril down to 20 mg daily as she was taking previously  OSA on CPAP Compliant with her CPAP Stable  Pulmonary embolism Documented on CT scan December 2018 Ultrasound showed no DVT left lower extremity, site of the cast, surgery She is still sedentary, risk of recurrent DVT, no regular exercise program Recommended she stay on Eliquis until June, then stop the medication We would recommend Eliquis 2.5 mg twice daily prophylaxis for long car trips, plane rides, surgeries  Controlled type 2 diabetes mellitus without complication, without long-term current use of insulin (HCC) Weight is down 10 pounds, encouraged her to continue strict diet  Hyperlipidemia Cholesterol is at goal on the current lipid regimen. No changes to the medications were made. Continue Crestor 40 mg  daily Stable   Total encounter time more than 25 minutes  Greater than 50% was spent in counseling and coordination of care with the patient   Disposition:   F/U  12 months   Orders Placed This Encounter  Procedures  . EKG 12-Lead     Signed, Dossie Arbour, M.D., Ph.D. 10/27/2017  [facility] [place], [place] [phone]

## 2017-10-27 ENCOUNTER — Encounter: Payer: Self-pay | Admitting: Cardiovascular Disease

## 2017-10-27 ENCOUNTER — Ambulatory Visit (INDEPENDENT_AMBULATORY_CARE_PROVIDER_SITE_OTHER): Payer: Managed Care, Other (non HMO) | Admitting: Cardiovascular Disease

## 2017-10-27 VITALS — BP 108/70 | HR 70 | Ht 64.0 in | Wt 207.2 lb

## 2017-10-27 DIAGNOSIS — G4733 Obstructive sleep apnea (adult) (pediatric): Secondary | ICD-10-CM

## 2017-10-27 DIAGNOSIS — I10 Essential (primary) hypertension: Secondary | ICD-10-CM | POA: Diagnosis not present

## 2017-10-27 DIAGNOSIS — Z9989 Dependence on other enabling machines and devices: Secondary | ICD-10-CM | POA: Diagnosis not present

## 2017-10-27 DIAGNOSIS — E7849 Other hyperlipidemia: Secondary | ICD-10-CM | POA: Diagnosis not present

## 2017-10-27 DIAGNOSIS — I2699 Other pulmonary embolism without acute cor pulmonale: Secondary | ICD-10-CM | POA: Diagnosis not present

## 2017-10-27 DIAGNOSIS — I82449 Acute embolism and thrombosis of unspecified tibial vein: Secondary | ICD-10-CM

## 2017-10-27 DIAGNOSIS — E119 Type 2 diabetes mellitus without complications: Secondary | ICD-10-CM

## 2017-10-27 MED ORDER — CLONIDINE HCL 0.1 MG PO TABS: 0.1000 mg | ORAL_TABLET | Freq: Two times a day (BID) | ORAL | 3 refills | Status: DC

## 2017-10-27 NOTE — Patient Instructions (Addendum)
Medication Instructions:   Please cut the clonidine down to 0.1 mg twice a day  Ok to hold eliquis in June  Labwork:  No new labs needed  Testing/Procedures:  No further testing at this time   Follow-Up: It was a pleasure seeing you in the office today. Please call us if you have new issues that need to be addressed before your next appt.  [phone]  Your physician wants you to follow-up in: 12 months.  You will receive a reminder letter in the mail two months in advance. If you don't receive a letter, please call our office to schedule the follow-up appointment.  If you need a refill on your cardiac medications before your next appointment, please call your pharmacy.  For educational health videos Log in to : [email or web address] Or : [email or web address], password : [email or web address]

## 2017-10-31 ENCOUNTER — Encounter: Payer: Self-pay | Admitting: Cardiovascular Disease

## 2017-11-22 ENCOUNTER — Other Ambulatory Visit: Payer: Self-pay

## 2017-11-22 ENCOUNTER — Ambulatory Visit
Admission: EM | Admit: 2017-11-22 | Discharge: 2017-11-22 | Disposition: A | Discharge status: Home / Self Care | Payer: Managed Care, Other (non HMO) | Attending: Family Medicine | Admitting: Family Medicine

## 2017-11-22 ENCOUNTER — Encounter: Payer: Self-pay | Admitting: Gynecology

## 2017-11-22 DIAGNOSIS — H6502 Acute serous otitis media, left ear: Secondary | ICD-10-CM | POA: Diagnosis not present

## 2017-11-22 DIAGNOSIS — R05 Cough: Secondary | ICD-10-CM

## 2017-11-22 DIAGNOSIS — R059 Cough, unspecified: Secondary | ICD-10-CM

## 2017-11-22 MED ORDER — HYDROCOD POLST-CPM POLST ER 10-8 MG/5ML PO SUER: 5.0000 mL | Freq: Two times a day (BID) | ORAL | 0 refills | Status: DC | PRN

## 2017-11-22 MED ORDER — AMOXICILLIN 875 MG PO TABS: 875.0000 mg | ORAL_TABLET | Freq: Two times a day (BID) | ORAL | 0 refills | Status: DC

## 2017-11-22 NOTE — ED Provider Notes (Signed)
[facility]    CSN: [id number] Arrival date & time: 11/22/17  0930     History   Chief Complaint Chief Complaint  Patient presents with  . Otalgia    HPI Erica Brock is a 67 y.o. female.   The history is provided by the patient.  Otalgia  Associated symptoms: cough and rhinorrhea   URI  Presenting symptoms: cough, ear pain and rhinorrhea   Severity:  Moderate Onset quality:  Sudden Duration:  1 week Timing:  Constant Progression:  Worsening Chronicity:  New Relieved by:  Nothing Ineffective treatments:  OTC medications Associated symptoms: no sinus pain and no wheezing   Risk factors: being elderly and sick contacts     Past Medical History:  Diagnosis Date  . Anxiety   . Arthritis   . Diabetes mellitus without complication (HCC)   . Diffuse cystic mastopathy 2012  . GERD (gastroesophageal reflux disease)   . Heart murmur    ETT myoview 12/09 (Dr. Juel Burrow) no evidence of ischemia, good exercise tolerance - 9 min. echo (11/10): EF 60-65%, nml RV, PASP 25, nml valves  . Hepatitis    as a child   . HLD (hyperlipidemia)   . HTN (hypertension)    for < 10 years  . Obesity   . OSA (obstructive sleep apnea)    mild; uses CPAP  . Rheumatic fever    as a child    Patient Active Problem List   Diagnosis Date Noted  . Pulmonary embolism (HCC) 07/22/2017  . DVT (deep venous thrombosis) (HCC) 07/22/2017  . Controlled type 2 diabetes mellitus without complication, without long-term current use of insulin (HCC) 08/08/2016  . Headache 07/22/2013  . Fibrocystic breast 11/09/2012  . Obese 09/02/2011  . Insomnia 09/02/2011  . OSA on CPAP 09/02/2011  . OTHER MALAISE AND FATIGUE 09/11/2010  . Hyperlipidemia 05/22/2009  . Essential hypertension 05/22/2009  . CHEST PAIN-UNSPECIFIED 05/22/2009  . RHEUMATIC FEVER, HX OF 05/22/2009    Past Surgical History:  Procedure Laterality Date  . arthroscopic surgery     L elbow   . BREAST BIOPSY Left    needle  bx-neg  . carpael tunnel release     R hand  . COLONOSCOPY  2008   Dr. Sharen Hint  . FOOT SURGERY Left   . reconstruction foot and bunion surgery - both feet    . TONSILLECTOMY    . TOTAL ABDOMINAL HYSTERECTOMY  1994    OB History    Gravida  6   Para  5   Term      Preterm      AB  1   Living  5     SAB  1   TAB      Ectopic      Multiple      Live Births           Obstetric Comments   First pregnancy 19 First menstrual  12         Home Medications    Prior to Admission medications   Medication Sig Start Date End Date Taking? Authorizing Provider  apixaban (ELIQUIS) 5 MG TABS tablet Take 1 tablet (5 mg total) by mouth 2 (two) times daily. 09/07/17  Yes Antonieta Iba, MD  cetirizine (ZYRTEC) 10 MG tablet Take 10 mg by mouth daily as needed for allergies.   Yes [provider]  cloNIDine (CATAPRES) 0.1 MG tablet Take 1 tablet (0.1 mg total) by mouth 2 (two)  times daily. 10/27/17  Yes Gollan, Tollie Pizza, MD  ezetimibe (ZETIA) 10 MG tablet TAKE 1 TABLET BY MOUTH DAILY 09/07/17  Yes Gollan, Tollie Pizza, MD  furosemide (LASIX) 20 MG tablet Take 1 tablet (20 mg total) by mouth daily as needed. 08/08/14  Yes Antonieta Iba, MD  lisinopril (PRINIVIL,ZESTRIL) 40 MG tablet Take 1 tablet (40 mg total) by mouth daily. 07/22/17  Yes Antonieta Iba, MD  metFORMIN (GLUCOPHAGE-XR) 500 MG 24 hr tablet Take 500 mg by mouth daily with breakfast.  05/22/15  Yes [provider]  montelukast (SINGULAIR) 10 MG tablet Take 10 mg by mouth daily as needed.   Yes [provider]  pantoprazole (PROTONIX) 40 MG tablet Take 40 mg by mouth daily.     Yes [provider]  rosuvastatin (CRESTOR) 40 MG tablet TAKE 1 TABLET BY MOUTH DAILY 09/07/17  Yes Gollan, Tollie Pizza, MD  amoxicillin (AMOXIL) 875 MG tablet Take 1 tablet (875 mg total) by mouth 2 (two) times daily. 11/22/17   Payton Mccallum, MD  chlorpheniramine-HYDROcodone (TUSSIONEX PENNKINETIC ER) 10-8  MG/5ML SUER Take 5 mLs by mouth every 12 (twelve) hours as needed. 11/22/17   Payton Mccallum, MD  hydrALAZINE (APRESOLINE) 50 MG tablet Take 50 mg by mouth 3 (three) times daily as needed.    [provider]  lidocaine (LIDODERM) 5 % Place 1 patch onto the skin every 12 (twelve) hours. Remove & Discard patch within 12 hours or as directed by MD 07/18/17 07/18/18  Willy Eddy, MD  potassium chloride (K-DUR) 10 MEQ tablet Take 10 mEq by mouth daily as needed.    [provider]    Family History Family History  Problem Relation Age of Onset  . Diabetes Father   . Hypertension Father   . Other Father        PPM  . Multiple sclerosis Mother   . Hyperlipidemia Mother   . Heart attack Sister        LATE 30'S, 40  . Aneurysm Brother        brain  . Hypertension Sister   . Hyperlipidemia Sister   . Breast cancer Neg Hx     Social History Social History   Tobacco Use  . Smoking status: Never Smoker  . Smokeless tobacco: Never Used  . Tobacco comment: tobacco use - no  Substance Use Topics  . Alcohol use: Yes    Comment: social   . Drug use: No     Allergies   Atorvastatin   Review of Systems Review of Systems  HENT: Positive for ear pain and rhinorrhea. Negative for sinus pain.   Respiratory: Positive for cough. Negative for wheezing.      Physical Exam Triage Vital Signs ED Triage Vitals  Enc Vitals Group     BP 11/22/17 0955 (!) 153/88     Pulse Rate 11/22/17 0955 76     Resp 11/22/17 0955 18     Temp 11/22/17 0955 98.5 F (36.9 C)     Temp Source 11/22/17 0955 Oral     SpO2 11/22/17 0955 99 %     Weight 11/22/17 0954 207 lb (93.9 kg)     Height 11/22/17 0954 5\' 4"  (1.626 m)     Head Circumference --      Peak Flow --      Pain Score 11/22/17 1016 0     Pain Loc --      Pain Edu? --  Excl. in GC? --    No data found.  Updated Vital Signs BP (!) 153/88 (BP Location: Left Arm)   Pulse 76   Temp 98.5 F (36.9 C) (Oral)   Resp  18   Ht 5\' 4"  (1.626 m)   Wt 207 lb (93.9 kg)   SpO2 99%   BMI 35.53 kg/m   Visual Acuity Right Eye Distance:   Left Eye Distance:   Bilateral Distance:    Right Eye Near:   Left Eye Near:    Bilateral Near:     Physical Exam  Constitutional: She appears well-developed and well-nourished. No distress.  HENT:  Head: Normocephalic and atraumatic.  Right Ear: Tympanic membrane, external ear and ear canal normal.  Left Ear: External ear and ear canal normal. Tympanic membrane is erythematous and bulging. A middle ear effusion is present.  Nose: Mucosal edema and rhinorrhea present. No nose lacerations, sinus tenderness, nasal deformity, septal deviation or nasal septal hematoma. No epistaxis.  No foreign bodies. Right sinus exhibits maxillary sinus tenderness and frontal sinus tenderness. Left sinus exhibits maxillary sinus tenderness and frontal sinus tenderness.  Mouth/Throat: Uvula is midline, oropharynx is clear and moist and mucous membranes are normal. No oropharyngeal exudate.  Eyes: Pupils are equal, round, and reactive to light. Conjunctivae and EOM are normal. Right eye exhibits no discharge. Left eye exhibits no discharge. No scleral icterus.  Neck: Normal range of motion. Neck supple. No thyromegaly present.  Cardiovascular: Normal rate, regular rhythm and normal heart sounds.  Pulmonary/Chest: Effort normal and breath sounds normal. No respiratory distress. She has no wheezes. She has no rales.  Lymphadenopathy:    She has no cervical adenopathy.  Skin: She is not diaphoretic.  Nursing note and vitals reviewed.    UC Treatments / Results  Labs (all labs ordered are listed, but only abnormal results are displayed) Labs Reviewed - No data to display  EKG None  Radiology No results found.  Procedures Procedures (including critical care time)  Medications Ordered in UC Medications - No data to display  Initial Impression / Assessment and Plan / UC Course  I  have reviewed the triage vital signs and the nursing notes.  Pertinent labs & imaging results that were available during my care of the patient were reviewed by me and considered in my medical decision making (see chart for details).     Final Clinical Impressions(s) / UC Diagnoses   Final diagnoses:  Acute serous otitis media of left ear, recurrence not specified  Cough    ED Prescriptions    Medication Sig Dispense Auth. Provider   amoxicillin (AMOXIL) 875 MG tablet Take 1 tablet (875 mg total) by mouth 2 (two) times daily. 20 tablet , MD   chlorpheniramine-HYDROcodone [facility] ER) 10-8 MG/5ML SUER Take 5 mLs by mouth every 12 (twelve) hours as needed. 60 mL [facility], MD     1. diagnosis reviewed with patient 2. rx as per orders above; reviewed possible side effects, interactions, risks and benefits  3. Follow-up prn if symptoms worsen or don't improve Controlled Substance Prescriptions [place] Controlled Substance Registry consulted? Not Applicable   Payton Mccallum, MD 11/22/17 1049

## 2017-11-22 NOTE — ED Triage Notes (Signed)
Patient c/o let eat pain/ drainage x yesterday.

## 2017-12-30 ENCOUNTER — Encounter: Payer: Self-pay | Admitting: Cardiovascular Disease

## 2018-01-07 ENCOUNTER — Encounter: Payer: Self-pay | Admitting: Cardiovascular Disease

## 2018-05-01 ENCOUNTER — Other Ambulatory Visit: Payer: Self-pay | Admitting: Cardiovascular Disease

## 2018-06-12 ENCOUNTER — Other Ambulatory Visit: Payer: Self-pay | Admitting: Cardiovascular Disease

## 2018-08-12 ENCOUNTER — Other Ambulatory Visit: Payer: Self-pay | Admitting: Cardiovascular Disease

## 2018-10-19 ENCOUNTER — Telehealth: Payer: Self-pay | Admitting: Cardiovascular Disease

## 2018-10-19 NOTE — Telephone Encounter (Signed)
Follow Up:; ° ° °Returning your call. °

## 2018-10-21 ENCOUNTER — Telehealth: Payer: Self-pay

## 2018-10-21 NOTE — Telephone Encounter (Signed)
Virtual Visit Pre-Appointment Phone Call  Steps For Call:  1. Confirm consent - "In the setting of the current Covid19 crisis, you are scheduled for a TELEPHONE visit with your provider on 11/09/2018 at 10:20AM.  Just as we do with many in-office visits, in order for you to participate in this visit, we must obtain consent.  If you'd like, I can send this to your mychart (if signed up) or email for you to review.  Otherwise, I can obtain your verbal consent now.  All virtual visits are billed to your insurance company just like a normal visit would be.  By agreeing to a virtual visit, we'd like you to understand that the technology does not allow for your provider to perform an examination, and thus may limit your provider's ability to fully assess your condition.  Finally, though the technology is pretty good, we cannot assure that it will always work on either your or our end, and in the setting of a video visit, we may have to convert it to a phone-only visit.  In either situation, we cannot ensure that we have a secure connection.  Are you willing to proceed?"  2. Give patient instructions for WebEx download to smartphone as below if video visit  3. Advise patient to be prepared with any vital sign or heart rhythm information, their current medicines, and a piece of paper and pen handy for any instructions they may receive the day of their visit  4. Inform patient they will receive a phone call 15 minutes prior to their appointment time (may be from unknown caller ID) so they should be prepared to answer  5. Confirm that appointment type is correct in Epic appointment notes (video vs telephone)    TELEPHONE CALL NOTE  Erica Brock has been deemed a candidate for a follow-up tele-health visit to limit community exposure during the Covid-19 pandemic. I spoke with the patient via phone to ensure availability of phone/video source, confirm preferred email & phone number, and discuss  instructions and expectations.  I reminded Erica Brock to be prepared with any vital sign and/or heart rhythm information that could potentially be obtained via home monitoring, at the time of her visit. I reminded Erica Brock to expect a phone call at the time of her visit if her visit.  Did the patient verbally acknowledge consent to treatment? YES  Erica Brock, [place] 10/21/2018 11:23 AM  CONSENT FOR TELE-HEALTH VISIT - PLEASE REVIEW  I hereby voluntarily request, consent and authorize [facility] and its employed or contracted physicians, physician assistants, nurse practitioners or other licensed health care professionals (the Practitioner), to provide me with telemedicine health care services (the Services") as deemed necessary by the treating Practitioner. I acknowledge and consent to receive the Services by the Practitioner via telemedicine. I understand that the telemedicine visit will involve communicating with the Practitioner through live audiovisual communication technology and the disclosure of certain medical information by electronic transmission. I acknowledge that I have been given the opportunity to request an in-person assessment or other available alternative prior to the telemedicine visit and am voluntarily participating in the telemedicine visit.  I understand that I have the right to withhold or withdraw my consent to the use of telemedicine in the course of my care at any time, without affecting my right to future care or treatment, and that the Practitioner or I may terminate the telemedicine visit at any time. I understand that I have  the right to inspect all information obtained and/or recorded in the course of the telemedicine visit and may receive copies of available information for a reasonable fee.  I understand that some of the potential risks of receiving the Services via telemedicine include:   Delay or interruption in medical evaluation due to  technological equipment failure or disruption;  Information transmitted may not be sufficient (e.g. poor resolution of images) to allow for appropriate medical decision making by the Practitioner; and/or   In rare instances, security protocols could fail, causing a breach of personal health information.  Furthermore, I acknowledge that it is my responsibility to provide information about my medical history, conditions and care that is complete and accurate to the best of my ability. I acknowledge that Practitioner's advice, recommendations, and/or decision may be based on factors not within their control, such as incomplete or inaccurate data provided by me or distortions of diagnostic images or specimens that may result from electronic transmissions. I understand that the practice of medicine is not an exact science and that Practitioner makes no warranties or guarantees regarding treatment outcomes. I acknowledge that I will receive a copy of this consent concurrently upon execution via email to the email address I last provided but may also request a printed copy by calling the office of [place].    I understand that my insurance will be billed for this visit.   I have read or had this consent read to me.  I understand the contents of this consent, which adequately explains the benefits and risks of the Services being provided via telemedicine.   I have been provided ample opportunity to ask questions regarding this consent and the Services and have had my questions answered to my satisfaction.  I give my informed consent for the services to be provided through the use of telemedicine in my medical care  By participating in this telemedicine visit I agree to the above.

## 2018-11-08 NOTE — Progress Notes (Signed)
Virtual Visit via Video Note   This visit type was conducted due to national recommendations for restrictions regarding the COVID-19 Pandemic (e.g. social distancing) in an effort to limit this patient's exposure and mitigate transmission in our community.  Due to her co-morbid illnesses, this patient is at least at moderate risk for complications without adequate follow up.  This format is felt to be most appropriate for this patient at this time.  All issues noted in this document were discussed and addressed.  A limited physical exam was performed with this format.  Please refer to the patient's chart for her consent to telehealth for [facility].   I connected with  Erica Brock on 11/08/18 by a video enabled telemedicine application and verified that I am speaking with the correct person using two identifiers. I discussed the limitations of evaluation and management by telemedicine. The patient expressed understanding and agreed to proceed.   Evaluation Performed:  Follow-up visit  Date:  11/08/2018   ID:  GLADIS Brock, DOB 31-Jan-1951, MRN [id number]  Patient Location:  [address] [place] [address]   Provider location:   [facility], [place] office  PCP:  Leim Fabry, MD  Cardiologist:  Fonnie Mu   Chief Complaint:  arthritis    History of Present Illness:    Erica Brock is a 68 y.o. female who presents via audio/video conferencing for a telehealth visit today.   The patient does not symptoms concerning for COVID-19 infection (fever, chills, cough, or new SHORTNESS OF BREATH).   Patient has a past medical history of HTN,  hyperlipidemia,  obesity,  Type 2 diabetes,  No prior smoking history Obstructive sleep apnea who wears CPAP,   family history of CAD (sister was smoker with MI and CABG)  comes for followup of her hyperlipidemia and hypertension,  pulmonary embolism in the setting of cast below the knee on her left leg   Polyarthralgia, unspecified Work up for RA vs CPPD Has sx of both Reaction to Prednisone Hands bad  Labs in 08/2018, reviewed with her in detail Total chol 120, LDL 33 HBA1C 6.8 BMP normal  Does eliquis 1/2 dose as needed for periods when she is sedentary Not daily  Blood 102/63 Pulse 58 resp 16  Other past med Had foot surgery on the left, Developed shortness of breath chest discomfort, presented to the emergency room July 18, 2017 CT scan confirming pulmonary embolism right lower lobe,  cast in place,  surgery 11/30 Cast off 08/03/17.  Followed at [facility]  Reports that she is still doing physical therapy,  Sedentary, Does some walking but not regular exercise Right lower extremity with swelling Not wearing compression hose  Through all of the above has had poor appetite, weight loss 10 pounds Has noticed blood pressure has been declining Previously we doubled her clonidine up to 0.2 mg twice daily, increase lisinopril up to 40 mg daily For high blood pressure  Now blood pressure running lower Wonders if she can cut down on her pills  Lab work reviewed hemoglobin A1c 6.0 total cholesterol 132, LDL 44, fasting glucose 159 Date of lab work November 2017  EKG personally reviewed by myself on todays visit  shows normal sinus rhythm rate 70 bpm left axis deviation no significant ST or T wave changes  Other past medical history She had leg edema 07/19/2013 and took Lasix. December 31 had high blood pressure, neck pain, headache. She went to  the emergency room and reports systolic pressure was more than 200. She was given extra clonidine. She went home and slept on new years day.   Previous blood work showed low sodium, low potassium on HCTZ. This was discontinued  sister has had bypass surgery  Previous cholesterol 198, LDL 87, triglycerides 300, HDL 52, hemoglobin A1c 5.8 The patient has never smoked.  Lab work from the emergency room 07/20/2013 was  essentially normal. Cardiac enzymes negative, CBC basic metabolic panel normal Chest x-ray normal    Prior CV studies:   The following studies were reviewed today:    Past Medical History:  Diagnosis Date  . Anxiety   . Arthritis   . Diabetes mellitus without complication (HCC)   . Diffuse cystic mastopathy 2012  . GERD (gastroesophageal reflux disease)   . Heart murmur    ETT myoview 12/09 (Dr. Juel BurrowMasoud) no evidence of ischemia, good exercise tolerance - 9 min. echo (11/10): EF 60-65%, nml RV, PASP 25, nml valves  . Hepatitis    as a child   . HLD (hyperlipidemia)   . HTN (hypertension)    for < 10 years  . Obesity   . OSA (obstructive sleep apnea)    mild; uses CPAP  . Rheumatic fever    as a child   Past Surgical History:  Procedure Laterality Date  . arthroscopic surgery     L elbow   . BREAST BIOPSY Left    needle bx-neg  . carpael tunnel release     R hand  . COLONOSCOPY  2008   Dr. Sharen HintSiegal  . FOOT SURGERY Left   . reconstruction foot and bunion surgery - both feet    . TONSILLECTOMY    . TOTAL ABDOMINAL HYSTERECTOMY  1994     No outpatient medications have been marked as taking for the 11/09/18 encounter (Appointment) with Antonieta IbaGollan, Darcey Cardy J, MD.     Allergies:   Atorvastatin   Social History   Tobacco Use  . Smoking status: Never Smoker  . Smokeless tobacco: Never Used  . Tobacco comment: tobacco use - no  Substance Use Topics  . Alcohol use: Yes    Comment: social   . Drug use: No     Current Outpatient Medications on File Prior to Visit  Medication Sig Dispense Refill  . amoxicillin (AMOXIL) 875 MG tablet Take 1 tablet (875 mg total) by mouth 2 (two) times daily. 20 tablet 0  . apixaban (ELIQUIS) 5 MG TABS tablet Take 1 tablet (5 mg total) by mouth 2 (two) times daily. 60 tablet 6  . cetirizine (ZYRTEC) 10 MG tablet Take 10 mg by mouth daily as needed for allergies.    . chlorpheniramine-HYDROcodone (TUSSIONEX PENNKINETIC ER) 10-8 MG/5ML SUER  Take 5 mLs by mouth every 12 (twelve) hours as needed. 60 mL 0  . cloNIDine (CATAPRES) 0.1 MG tablet TAKE 1 TABLET BY MOUTH TWICE DAILY 180 tablet 3  . ezetimibe (ZETIA) 10 MG tablet TAKE 1 TABLET BY MOUTH DAILY 90 tablet 2  . furosemide (LASIX) 20 MG tablet Take 1 tablet (20 mg total) by mouth daily as needed. 30 tablet 3  . hydrALAZINE (APRESOLINE) 50 MG tablet Take 50 mg by mouth 3 (three) times daily as needed.    Marland Kitchen. lisinopril (PRINIVIL,ZESTRIL) 40 MG tablet Take 1 tablet (40 mg total) by mouth daily. 90 tablet 3  . lisinopril (PRINIVIL,ZESTRIL) 40 MG tablet TAKE 1 TABLET BY MOUTH DAILY 90 tablet 3  . metFORMIN (GLUCOPHAGE-XR) 500  MG 24 hr tablet Take 500 mg by mouth daily with breakfast.     . montelukast (SINGULAIR) 10 MG tablet Take 10 mg by mouth daily as needed.    . pantoprazole (PROTONIX) 40 MG tablet Take 40 mg by mouth daily.      . potassium chloride (K-DUR) 10 MEQ tablet Take 10 mEq by mouth daily as needed.    . rosuvastatin (CRESTOR) 40 MG tablet TAKE 1 TABLET BY MOUTH DAILY 90 tablet 2   No current facility-administered medications on file prior to visit.      Family Hx: The patient's family history includes Aneurysm in her brother; Diabetes in her father; Heart attack in her sister; Hyperlipidemia in her mother and sister; Hypertension in her father and sister; Multiple sclerosis in her mother; Other in her father. There is no history of Breast cancer.  ROS:   Please see the history of present illness.    Review of Systems  Constitutional: Negative.   Respiratory: Negative.   Cardiovascular: Negative.   Gastrointestinal: Negative.   Musculoskeletal: Positive for joint pain.  Neurological: Negative.   Psychiatric/Behavioral: Negative.   All other systems reviewed and are negative.     Labs/Other Tests and Data Reviewed:    Recent Labs: No results found for requested labs within last 8760 hours.   Recent Lipid Panel Lab Results  Component Value Date/Time    CHOL 158 11/15/2015 08:43 AM   TRIG 153 (H) 11/15/2015 08:43 AM   TRIG 243 11/09/2008   HDL 69 11/15/2015 08:43 AM   CHOLHDL 2.3 Ratio 09/11/2010 09:41 PM   LDLCALC 58 11/15/2015 08:43 AM    Wt Readings from Last 3 Encounters:  11/22/17 207 lb (93.9 kg)  10/27/17 207 lb 4 oz (94 kg)  07/22/17 216 lb (98 kg)     Exam:    Vital Signs: Vital signs may also be detailed in the HPI There were no vitals taken for this visit.  Wt Readings from Last 3 Encounters:  11/22/17 207 lb (93.9 kg)  10/27/17 207 lb 4 oz (94 kg)  07/22/17 216 lb (98 kg)   Temp Readings from Last 3 Encounters:  11/22/17 98.5 F (36.9 C) (Oral)  07/18/17 98.3 F (36.8 C) (Oral)  07/05/15 98.4 F (36.9 C) (Oral)   BP Readings from Last 3 Encounters:  11/22/17 (!) 153/88  10/27/17 108/70  07/22/17 134/88   Pulse Readings from Last 3 Encounters:  11/22/17 76  10/27/17 70  07/22/17 85    Blood 102/63 Pulse 58 resp 16  Well nourished, well developed female in no acute distress. Constitutional:  oriented to person, place, and time. No distress.  Head: Normocephalic and atraumatic.  Eyes:  no discharge. No scleral icterus.  Neck: Normal range of motion. Neck supple.  Pulmonary/Chest: No audible wheezing, no distress, appears comfortable Musculoskeletal: Normal range of motion.  no  tenderness or deformity.  Neurological:   Coordination normal. Full exam not performed Skin:  No rash Psychiatric:  normal mood and affect. behavior is normal. Thought content normal.    ASSESSMENT & PLAN:    Essential hypertension - BP running low, We will decrease the lisinopril to 10 mg daily Monitor BP Stay on clonidine 0.1 BID  OSA on CPAP Compliant with her CPAP Stable  Pulmonary embolism Documented on CT scan December 2018 Ultrasound showed no DVT left lower extremity, site of the cast, surgery She is still sedentary, risk of recurrent DVT, no regular exercise program Now off eliquis --  recommend  Eliquis 2.5 mg twice daily prophylaxis for long car trips, plane rides, surgeries  Controlled type 2 diabetes mellitus without complication, without long-term current use of insulin (HCC) Stable weight, HBA1C stable  Hyperlipidemia Cholesterol is at goal on the current lipid regimen. No changes to the medications were made. Stable   COVID-19 Education: The signs and symptoms of COVID-19 were discussed with the patient and how to seek care for testing (follow up with PCP or arrange E-visit).  The importance of social distancing was discussed today.  Patient Risk:   After full review of this patients clinical status, I feel that they are at least moderate risk at this time.  Time:   Today, I have spent 25 minutes with the patient with telehealth technology discussing the cardiac and medical problems/diagnoses detailed above   10 min spent reviewing the chart prior to patient visit today   Medication Adjustments/Labs and Tests Ordered: Current medicines are reviewed at length with the patient today.  Concerns regarding medicines are outlined above.   Tests Ordered: No tests ordered   Medication Changes: No changes made   Disposition: Follow-up in 6 months   Signed, Julien Nordmann, MD  11/08/2018 6:51 PM    [facility] [address], [place], [place] [address]

## 2018-11-09 ENCOUNTER — Telehealth: Payer: Self-pay

## 2018-11-09 ENCOUNTER — Telehealth (INDEPENDENT_AMBULATORY_CARE_PROVIDER_SITE_OTHER): Payer: Medicare HMO | Admitting: Cardiovascular Disease

## 2018-11-09 ENCOUNTER — Other Ambulatory Visit: Payer: Self-pay

## 2018-11-09 VITALS — BP 102/63 | HR 58 | Ht 64.0 in | Wt 210.0 lb

## 2018-11-09 DIAGNOSIS — I82449 Acute embolism and thrombosis of unspecified tibial vein: Secondary | ICD-10-CM | POA: Diagnosis not present

## 2018-11-09 DIAGNOSIS — E7849 Other hyperlipidemia: Secondary | ICD-10-CM

## 2018-11-09 DIAGNOSIS — I10 Essential (primary) hypertension: Secondary | ICD-10-CM

## 2018-11-09 DIAGNOSIS — E119 Type 2 diabetes mellitus without complications: Secondary | ICD-10-CM | POA: Diagnosis not present

## 2018-11-09 MED ORDER — APIXABAN 2.5 MG PO TABS: 2.5000 mg | ORAL_TABLET | Freq: Two times a day (BID) | ORAL | Status: DC

## 2018-11-09 MED ORDER — EZETIMIBE 10 MG PO TABS: 10.0000 mg | ORAL_TABLET | Freq: Every day | ORAL | 3 refills | Status: DC

## 2018-11-09 MED ORDER — ROSUVASTATIN CALCIUM 40 MG PO TABS: 40.0000 mg | ORAL_TABLET | Freq: Every day | ORAL | 3 refills | Status: AC

## 2018-11-09 MED ORDER — LISINOPRIL 10 MG PO TABS: 10.0000 mg | ORAL_TABLET | Freq: Every day | ORAL | 3 refills | Status: DC

## 2018-11-09 NOTE — Telephone Encounter (Signed)
Called patient.  Obtained vitals.  Reviewed medications and allergies with patient.  Patient stated she only takes her Eliquis 1/2 Twice a day when traveling.  Not sure if she is confusing this medication with another

## 2018-11-09 NOTE — Patient Instructions (Addendum)
Medication Instructions:  Refill cardiac meds (zetia, crestor, lisinopril 10) Please decrease the lisinopril down to 10 mg daily Hold off on clonidiine refill and eliquis, lasix, potassium eliquis is 2.5 mg twice a day as needed for travel 90 day refills [facility]  If you need a refill on your cardiac medications before your next appointment, please call your pharmacy.    Lab work: No new labs needed   If you have labs (blood work) drawn today and your tests are completely normal, you will receive your results only by: Marland Kitchen MyChart Message (if you have MyChart) OR . A paper copy in the mail If you have any lab test that is abnormal or we need to change your treatment, we will call you to review the results.   Testing/Procedures: No new testing needed   Follow-Up: At [facility], you and your health needs are our priority.  As part of our continuing mission to provide you with exceptional heart care, we have created designated Provider Care Teams.  These Care Teams include your primary Cardiologist (physician) and Advanced Practice Providers (APPs -  Physician Assistants and Nurse Practitioners) who all work together to provide you with the care you need, when you need it.  . You will need a follow up appointment in 12 months .   Please call our office 2 months in advance to schedule this appointment.    . Providers on your designated Care Team:   . Nicolasa Ducking, NP . Eula Listen, PA-C . Marisue Ivan, PA-C  Any Other Special Instructions Will Be Listed Below (If Applicable).  For educational health videos Log in to : [email or web address] Or : [email or web address], password : [email or web address]

## 2018-11-22 ENCOUNTER — Telehealth: Payer: Self-pay | Admitting: Cardiovascular Disease

## 2018-11-22 NOTE — Telephone Encounter (Signed)
Please call regarding forms for Zetia.

## 2018-11-22 NOTE — Telephone Encounter (Signed)
Spoke with patient and she states that her pharmacy [facility] was sending forms over to lowering tier and price for her. She states that she spoke to them on April 21st. Advised that with current COVID crisis there may be some delays. Recommended that she check through GoodRx because with cash price it is roughly $17.00 for 90 day supply. Also reviewed that [place] in [place] also has some lower cash prices without using insurance information. Advised that I would be watching for any paperwork from the [facility]. She was appreciative for the call back with no further questions at this time.

## 2018-11-26 ENCOUNTER — Telehealth: Payer: Self-pay | Admitting: Cardiovascular Disease

## 2018-11-26 NOTE — Telephone Encounter (Signed)
No answer. Left message to call back.   

## 2018-11-26 NOTE — Telephone Encounter (Signed)
New Message          Medicare Part D is calling Erica Brock) to see if we can get "Ezetimibe 10 mg"  Cheaper for the  Patient. Medicare would like to know if the patient can  get a different medicatiion or she must have this one. Ok to leave a vmsg on her phone. Pls call  [phone] Erica Brock

## 2018-11-26 NOTE — Telephone Encounter (Signed)
Called pharmacy.  With the insurance that they have on file for a 90 day supply Patients Zetia is currently $173.99 Looks like patient has an allergy to Atorvastatin.  Can you please advise what medication patient could switch to that would be a lower cost to her.

## 2018-11-26 NOTE — Telephone Encounter (Signed)
She is on zetia/ezetimibe b/c she is intolerant to atorvastatin.  Though ezetimibe is generic, it is still pretty pricey.  There is no specific alternative to it. It's possible to look into one of the injectable agents for her, however they are not generic and are generally much more expensive.  Finally, if she has only ever tried atorvastatin, a low dose of rosuvastatin could be tried (10mg ) as it might be better tolerated.

## 2018-11-29 MED ORDER — EZETIMIBE 10 MG PO TABS: 10.0000 mg | ORAL_TABLET | Freq: Every day | ORAL | 3 refills | Status: DC

## 2018-11-29 NOTE — Telephone Encounter (Signed)
Patient returning call.

## 2018-11-29 NOTE — Telephone Encounter (Signed)
No answer with Erica Brock at [place].  Left message that it is ok if medication is generic or brand name and to call back if anything else is needed.

## 2018-11-29 NOTE — Telephone Encounter (Signed)
Patient said this all started when she had to try to get the Tier lowered for her Zetia with [facility]. We had already submitted the Tier exception and it was denied.  Patient has a few weeks left but wants to be prepared for when she runs out. She would like to stay on Zetia because it is working for her. She would like to opt to use a GoodRx coupon and get the prescription from [facility] instead. Rx sent to [facility]. She was very Adult nurse.

## 2018-11-29 NOTE — Telephone Encounter (Signed)
Erica Brock with [employer]  States that they are working on the patient's appeal Would like to know if ezetimibe medication has to be that specific medication If not they will be able to lower the cost Please call Erica Brock at [phone], ok to leave VM

## 2018-12-03 NOTE — Telephone Encounter (Signed)
Appeal approval received from [facility] for Zetia. Approval # [id number] Approval good through 07/21/19

## 2019-04-15 ENCOUNTER — Other Ambulatory Visit: Payer: Self-pay | Admitting: Family Medicine

## 2019-04-15 DIAGNOSIS — Z1231 Encounter for screening mammogram for malignant neoplasm of breast: Secondary | ICD-10-CM

## 2019-04-27 ENCOUNTER — Ambulatory Visit
Admission: RE | Admit: 2019-04-27 | Discharge: 2019-04-27 | Disposition: A | Discharge status: Home / Self Care | Payer: Medicare HMO | Source: Ambulatory Visit | Attending: Family Medicine | Admitting: Family Medicine

## 2019-04-27 ENCOUNTER — Other Ambulatory Visit: Payer: Self-pay

## 2019-04-27 DIAGNOSIS — Z1231 Encounter for screening mammogram for malignant neoplasm of breast: Secondary | ICD-10-CM | POA: Diagnosis present

## 2019-09-20 ENCOUNTER — Other Ambulatory Visit: Payer: Self-pay | Admitting: Cardiovascular Disease

## 2019-10-11 ENCOUNTER — Ambulatory Visit: Payer: Medicare HMO | Admitting: Cardiovascular Disease

## 2019-10-25 ENCOUNTER — Ambulatory Visit: Payer: Medicare HMO | Admitting: Cardiovascular Disease

## 2019-11-08 NOTE — Progress Notes (Signed)
Evaluation Performed:  Follow-up visit  Date:  11/09/2019   ID:  Erica Brock, DOB 07/11/1951, MRN [id number]  Patient Location:  [address] [place] [address]   Provider location:   [facility], [place] office  PCP:  Leim Fabry, MD  Cardiologist:  [facility]   Chief Complaint  Patient presents with  . OTHER    12 month f/u c/o elevated BP with exertion and sob. Meds reviewed verbally with pt.    History of Present Illness:    Erica Brock is a 69 y.o. female  past medical history of Coronary artery disease, severe three-vessel coronary calcification seen on CT scan, minimal aortic atherosclerosis HTN,  hyperlipidemia,  obesity,  Type 2 diabetes,  No prior smoking history Obstructive sleep apnea who wears CPAP,   family history of CAD (sister was smoker with MI and CABG)  comes for followup of her hyperlipidemia and hypertension,  pulmonary embolism in the setting of cast below the knee on her left leg  In follow-up today she reports having some worsening shortness of breath with exertion Over the past year has been more sedentary, weight trending upwards 10 pounds  BP elevated at home today Feels it is better controlled at home Estimates BP 120-130 systolic at home  Previously was on lisinopril 40 with clonidine 0.2 twice daily, doses were dropped after 10 pound weight loss and blood pressure was lower  Labs reviewed on today's visit HBG 11.4 CR 1.0 Total chol 129, LDL 38,  HBA1c 6.0  CT chest 2018 images pulled up and reviewed in detail Severe three-vessel coronary calcification Minimal aortic atherosclerosis  EKG personally reviewed by myself on todays visit Shows normal sinus rhythm rate 71 bpm no significant ST-T wave changes  Other past medical history reviewed Prior history of Polyarthralgia, unspecified Work up for RA vs CPPD Has sx of both Reaction to Prednisone Hands bad  Had foot surgery on the  left, Developed shortness of breath chest discomfort, presented to the emergency room July 18, 2017 CT scan confirming pulmonary embolism right lower lobe,  cast in place,  surgery 11/30 Cast off 08/03/17.  Followed at [facility]  Reports that she is still doing physical therapy,  Sedentary, Does some walking but not regular exercise Right lower extremity with swelling Not wearing compression hose  She had leg edema 07/19/2013 and took Lasix. December 31 had high blood pressure, neck pain, headache. She went to the emergency room and reports systolic pressure was more than 200. She was given extra clonidine. She went home and slept on new years day.   Previous blood work showed low sodium, low potassium on HCTZ. This was discontinued  sister has had bypass surgery  Previous cholesterol 198, LDL 87, triglycerides 300, HDL 52, hemoglobin A1c 5.8 The patient has never smoked.  Lab work from the emergency room 07/20/2013 was essentially normal. Cardiac enzymes negative, CBC basic metabolic panel normal Chest x-ray normal    Prior CV studies:   The following studies were reviewed today:    Past Medical History:  Diagnosis Date  . Anxiety   . Arthritis   . Diabetes mellitus without complication (HCC)   . Diffuse cystic mastopathy 2012  . GERD (gastroesophageal reflux disease)   . Heart murmur    ETT myoview 12/09 (Dr. Juel Burrow) no evidence of ischemia, good exercise tolerance - 9 min. echo (11/10): EF 60-65%, nml RV, PASP 25, nml valves  . Hepatitis  as a child   . HLD (hyperlipidemia)   . HTN (hypertension)    for < 10 years  . Obesity   . OSA (obstructive sleep apnea)    mild; uses CPAP  . Rheumatic fever    as a child   Past Surgical History:  Procedure Laterality Date  . arthroscopic surgery     L elbow   . BREAST BIOPSY Left    needle bx-neg  . carpael tunnel release     R hand  . COLONOSCOPY  2008   Dr. Epifanio Lesches  . FOOT SURGERY Left   . reconstruction  foot and bunion surgery - both feet    . TONSILLECTOMY    . TOTAL ABDOMINAL HYSTERECTOMY  1994     Current Meds  Medication Sig  . apixaban (ELIQUIS) 2.5 MG TABS tablet Take 1 tablet (2.5 mg total) by mouth 2 (two) times daily. PRN for travel  . aspirin 81 MG chewable tablet Chew by mouth.  . cetirizine (ZYRTEC) 10 MG tablet Take 10 mg by mouth daily as needed for allergies.  . cloNIDine (CATAPRES) 0.1 MG tablet TAKE 1 TABLET BY MOUTH TWICE DAILY  . ezetimibe (ZETIA) 10 MG tablet Take 1 tablet (10 mg total) by mouth daily.  . furosemide (LASIX) 20 MG tablet Take 1 tablet (20 mg total) by mouth daily as needed.  . hydroxychloroquine (PLAQUENIL) 200 MG tablet Take 400 mg by mouth daily.   Marland Kitchen lisinopril (ZESTRIL) 10 MG tablet TAKE 1 TABLET DAILY  . metFORMIN (GLUCOPHAGE-XR) 500 MG 24 hr tablet Take 500 mg by mouth in the morning and at bedtime.   . montelukast (SINGULAIR) 10 MG tablet Take 10 mg by mouth daily as needed.  . pantoprazole (PROTONIX) 40 MG tablet Take 40 mg by mouth daily.    . rosuvastatin (CRESTOR) 40 MG tablet Take 1 tablet (40 mg total) by mouth daily.     Allergies:   Atorvastatin and Prednisone   Social History   Tobacco Use  . Smoking status: Never Smoker  . Smokeless tobacco: Never Used  . Tobacco comment: tobacco use - no  Substance Use Topics  . Alcohol use: Yes    Comment: social   . Drug use: No     Family Hx: The patient's family history includes Aneurysm in her brother; Diabetes in her father; Heart attack in her sister; Hyperlipidemia in her mother and sister; Hypertension in her father and sister; Multiple sclerosis in her mother; Other in her father. There is no history of Breast cancer.  ROS:   Please see the history of present illness.    Review of Systems  Constitutional: Negative.   HENT: Negative.   Respiratory: Positive for shortness of breath.   Cardiovascular: Negative.   Gastrointestinal: Negative.   Musculoskeletal: Positive for joint  pain.  Neurological: Positive for speech change.  Psychiatric/Behavioral: Negative.   All other systems reviewed and are negative.     Labs/Other Tests and Data Reviewed:    Recent Labs: No results found for requested labs within last 8760 hours.   Recent Lipid Panel Lab Results  Component Value Date/Time   CHOL 158 11/15/2015 08:43 AM   TRIG 153 (H) 11/15/2015 08:43 AM   TRIG 243 11/09/2008 12:00 AM   HDL 69 11/15/2015 08:43 AM   CHOLHDL 2.3 Ratio 09/11/2010 09:41 PM   LDLCALC 58 11/15/2015 08:43 AM    Wt Readings from Last 3 Encounters:  11/09/19 219 lb 2 oz (99.4 kg)  11/09/18  210 lb (95.3 kg)  11/22/17 207 lb (93.9 kg)     Exam:    BP (!) 158/90 (BP Location: Left Arm, Patient Position: Sitting, Cuff Size: Large)   Pulse 71   Ht 5\' 4"  (1.626 m)   Wt 219 lb 2 oz (99.4 kg)   SpO2 98%   BMI 37.61 kg/m   Constitutional:  oriented to person, place, and time. No distress.  Obese HENT:  Head: Grossly normal Eyes:  no discharge. No scleral icterus.  Neck: No JVD, no carotid bruits  Cardiovascular: Regular rate and rhythm, no murmurs appreciated Pulmonary/Chest: Clear to auscultation bilaterally, no wheezes or rails Abdominal: Soft.  no distension.  no tenderness.  Musculoskeletal: Normal range of motion Neurological:  normal muscle tone. Coordination normal. No atrophy Skin: Skin warm and dry Psychiatric: normal affect, pleasant   ASSESSMENT & PLAN:    Essential hypertension - On prior clinic visit blood pressure was running low and medications were dropped including clonidine and lisinopril  blood pressure elevated today, weight has trended higher 10 pounds Recommended she monitor blood pressure closely and call our office, we cannot increase the doses as needed  OSA on CPAP Compliant with her CPAP Stable Lifestyle modification recommended  Pulmonary embolism Documented on CT scan December 2018 Ultrasound showed no DVT left lower extremity, site of the  cast, surgery She is still sedentary, risk of recurrent DVT, no regular exercise program Now off eliquis Recommend we use Eliquis 2.5 as needed for long trips when she is at risk of recurrent DVT Also recommended compression hose  Controlled type 2 diabetes mellitus without complication, without long-term current use of insulin (HCC) We have encouraged continued exercise, careful diet management in an effort to lose weight.  Coronary artery disease with stable angina Severe three-vessel coronary calcification seen on CT scan chest Stressed importance of aggressive diabetes, lipid control and monitoring of her shortness of breath symptoms If symptoms get worse we will order stress test or cardiac catheterization  Hyperlipidemia Cholesterol is at goal on the current lipid regimen. No changes to the medications were made. Stable   Disposition: Follow-up in 6 months   Signed, January 2019, MD  11/09/2019 10:07 AM    [facility] [address], [place], [place]

## 2019-11-09 ENCOUNTER — Ambulatory Visit: Payer: Medicare HMO | Admitting: Cardiovascular Disease

## 2019-11-09 ENCOUNTER — Other Ambulatory Visit: Payer: Self-pay

## 2019-11-09 ENCOUNTER — Encounter: Payer: Self-pay | Admitting: Cardiovascular Disease

## 2019-11-09 VITALS — BP 158/90 | HR 71 | Ht 64.0 in | Wt 219.1 lb

## 2019-11-09 DIAGNOSIS — E119 Type 2 diabetes mellitus without complications: Secondary | ICD-10-CM

## 2019-11-09 DIAGNOSIS — I10 Essential (primary) hypertension: Secondary | ICD-10-CM

## 2019-11-09 MED ORDER — APIXABAN 2.5 MG PO TABS: 2.5000 mg | ORAL_TABLET | Freq: Two times a day (BID) | ORAL | 0 refills | Status: DC

## 2019-11-09 NOTE — Patient Instructions (Addendum)
Medication Instructions:  Eliquis 2.5 mg twice a day when traveling (samples)  Samples Given: Eliquis 2.5 mg Lot: [id number] Exp: 06/2020 # 2 boxes  If you need a refill on your cardiac medications before your next appointment, please call your pharmacy.    Lab work: No new labs needed   If you have labs (blood work) drawn today and your tests are completely normal, you will receive your results only by: Marland Kitchen MyChart Message (if you have MyChart) OR . A paper copy in the mail If you have any lab test that is abnormal or we need to change your treatment, we will call you to review the results.   Testing/Procedures: No new testing needed   Follow-Up: At [facility], you and your health needs are our priority.  As part of our continuing mission to provide you with exceptional heart care, we have created designated Provider Care Teams.  These Care Teams include your primary Cardiologist (physician) and Advanced Practice Providers (APPs -  Physician Assistants and Nurse Practitioners) who all work together to provide you with the care you need, when you need it.  . You will need a follow up appointment in 6 months  . Providers on your designated Care Team:   . Nicolasa Ducking, NP . Eula Listen, PA-C . Marisue Ivan, PA-C  Any Other Special Instructions Will Be Listed Below (If Applicable).  For educational health videos Log in to : [email or web address] Or : [email or web address], password : [email or web address]

## 2019-12-21 ENCOUNTER — Other Ambulatory Visit: Payer: Self-pay | Admitting: Cardiovascular Disease

## 2020-01-14 ENCOUNTER — Other Ambulatory Visit: Payer: Self-pay | Admitting: Cardiovascular Disease

## 2020-04-18 ENCOUNTER — Other Ambulatory Visit: Payer: Self-pay | Admitting: Family Medicine

## 2020-04-18 DIAGNOSIS — Z1231 Encounter for screening mammogram for malignant neoplasm of breast: Secondary | ICD-10-CM

## 2020-04-23 ENCOUNTER — Other Ambulatory Visit: Payer: Self-pay

## 2020-04-23 MED ORDER — CLONIDINE HCL 0.1 MG PO TABS: 0.1000 mg | ORAL_TABLET | Freq: Two times a day (BID) | ORAL | 3 refills | Status: DC

## 2020-04-23 MED ORDER — CLONIDINE HCL 0.1 MG PO TABS: 0.1000 mg | ORAL_TABLET | Freq: Two times a day (BID) | ORAL | 0 refills | Status: DC

## 2020-05-01 ENCOUNTER — Other Ambulatory Visit: Payer: Self-pay

## 2020-05-01 ENCOUNTER — Ambulatory Visit
Admission: RE | Admit: 2020-05-01 | Discharge: 2020-05-01 | Disposition: A | Discharge status: Home / Self Care | Payer: Medicare HMO | Source: Ambulatory Visit | Attending: Family Medicine | Admitting: Family Medicine

## 2020-05-01 DIAGNOSIS — Z1231 Encounter for screening mammogram for malignant neoplasm of breast: Secondary | ICD-10-CM

## 2020-05-05 NOTE — Progress Notes (Deleted)
Evaluation Performed:  Follow-up visit  Date:  05/05/2020   ID:  Erica Brock, DOB 05-01-1951, MRN [id number]  Patient Location:  [address] [place] [address]   Provider location:   [facility], [place] office  PCP:  Leim Fabry, MD  Cardiologist:  [facility]   No chief complaint on file.   History of Present Illness:    Erica Brock is a 69 y.o. female  past medical history of Coronary artery disease, severe three-vessel coronary calcification seen on CT scan, minimal aortic atherosclerosis HTN,  hyperlipidemia,  obesity,  Type 2 diabetes,  No prior smoking history Obstructive sleep apnea who wears CPAP,   family history of CAD (sister was smoker with MI and CABG)  comes for followup of her hyperlipidemia and hypertension,  pulmonary embolism in the setting of cast below the knee on her left leg  In follow-up today she reports having some worsening shortness of breath with exertion Over the past year has been more sedentary, weight trending upwards 10 pounds  BP elevated at home today Feels it is better controlled at home Estimates BP 120-130 systolic at home  Previously was on lisinopril 40 with clonidine 0.2 twice daily, doses were dropped after 10 pound weight loss and blood pressure was lower  Labs reviewed on today's visit HBG 11.4 CR 1.0 Total chol 129, LDL 38,  HBA1c 6.0  CT chest 2018 images pulled up and reviewed in detail Severe three-vessel coronary calcification Minimal aortic atherosclerosis  EKG personally reviewed by myself on todays visit Shows normal sinus rhythm rate 71 bpm no significant ST-T wave changes  Other past medical history reviewed Prior history of Polyarthralgia, unspecified Work up for RA vs CPPD Has sx of both Reaction to Prednisone Hands bad  Had foot surgery on the left, Developed shortness of breath chest discomfort, presented to the emergency room July 18, 2017 CT  scan confirming pulmonary embolism right lower lobe,  cast in place,  surgery 11/30 Cast off 08/03/17.  Followed at [facility]  Reports that she is still doing physical therapy,  Sedentary, Does some walking but not regular exercise Right lower extremity with swelling Not wearing compression hose  She had leg edema 07/19/2013 and took Lasix. December 31 had high blood pressure, neck pain, headache. She went to the emergency room and reports systolic pressure was more than 200. She was given extra clonidine. She went home and slept on new years day.   Previous blood work showed low sodium, low potassium on HCTZ. This was discontinued  sister has had bypass surgery  Previous cholesterol 198, LDL 87, triglycerides 300, HDL 52, hemoglobin A1c 5.8 The patient has never smoked.  Lab work from the emergency room 07/20/2013 was essentially normal. Cardiac enzymes negative, CBC basic metabolic panel normal Chest x-ray normal    Prior CV studies:   The following studies were reviewed today:    Past Medical History:  Diagnosis Date  . Anxiety   . Arthritis   . Diabetes mellitus without complication (HCC)   . Diffuse cystic mastopathy 2012  . GERD (gastroesophageal reflux disease)   . Heart murmur    ETT myoview 12/09 (Dr. Juel Burrow) no evidence of ischemia, good exercise tolerance - 9 min. echo (11/10): EF 60-65%, nml RV, PASP 25, nml valves  . Hepatitis    as a child   . HLD (hyperlipidemia)   . HTN (hypertension)    for < 10 years  .  Obesity   . OSA (obstructive sleep apnea)    mild; uses CPAP  . Rheumatic fever    as a child   Past Surgical History:  Procedure Laterality Date  . arthroscopic surgery     L elbow   . BREAST BIOPSY Left    needle bx-neg  . carpael tunnel release     R hand  . COLONOSCOPY  2008   Dr. Sharen Hint  . FOOT SURGERY Left   . reconstruction foot and bunion surgery - both feet    . TONSILLECTOMY    . TOTAL ABDOMINAL HYSTERECTOMY  1994     No  outpatient medications have been marked as taking for the 05/07/20 encounter (Appointment) with Antonieta Iba, MD.     Allergies:   Atorvastatin and Prednisone   Social History   Tobacco Use  . Smoking status: Never Smoker  . Smokeless tobacco: Never Used  . Tobacco comment: tobacco use - no  Vaping Use  . Vaping Use: Never used  Substance Use Topics  . Alcohol use: Yes    Comment: social   . Drug use: No     Family Hx: The patient's family history includes Aneurysm in her brother; Diabetes in her father; Heart attack in her sister; Hyperlipidemia in her mother and sister; Hypertension in her father and sister; Multiple sclerosis in her mother; Other in her father. There is no history of Breast cancer.  ROS:   Please see the history of present illness.    Review of Systems  Constitutional: Negative.   HENT: Negative.   Respiratory: Positive for shortness of breath.   Cardiovascular: Negative.   Gastrointestinal: Negative.   Musculoskeletal: Positive for joint pain.  Neurological: Positive for speech change.  Psychiatric/Behavioral: Negative.   All other systems reviewed and are negative.     Labs/Other Tests and Data Reviewed:    Recent Labs: No results found for requested labs within last 8760 hours.   Recent Lipid Panel Lab Results  Component Value Date/Time   CHOL 158 11/15/2015 08:43 AM   TRIG 153 (H) 11/15/2015 08:43 AM   TRIG 243 11/09/2008 12:00 AM   HDL 69 11/15/2015 08:43 AM   CHOLHDL 2.3 Ratio 09/11/2010 09:41 PM   LDLCALC 58 11/15/2015 08:43 AM    Wt Readings from Last 3 Encounters:  11/09/19 219 lb 2 oz (99.4 kg)  11/09/18 210 lb (95.3 kg)  11/22/17 207 lb (93.9 kg)     Exam:    There were no vitals taken for this visit.  Constitutional:  oriented to person, place, and time. No distress.  Obese HENT:  Head: Grossly normal Eyes:  no discharge. No scleral icterus.  Neck: No JVD, no carotid bruits  Cardiovascular: Regular rate and  rhythm, no murmurs appreciated Pulmonary/Chest: Clear to auscultation bilaterally, no wheezes or rails Abdominal: Soft.  no distension.  no tenderness.  Musculoskeletal: Normal range of motion Neurological:  normal muscle tone. Coordination normal. No atrophy Skin: Skin warm and dry Psychiatric: normal affect, pleasant   ASSESSMENT & PLAN:    Essential hypertension - On prior clinic visit blood pressure was running low and medications were dropped including clonidine and lisinopril  blood pressure elevated today, weight has trended higher 10 pounds Recommended she monitor blood pressure closely and call our office, we cannot increase the doses as needed  OSA on CPAP Compliant with her CPAP Stable Lifestyle modification recommended  Pulmonary embolism Documented on CT scan December 2018 Ultrasound showed no DVT left  lower extremity, site of the cast, surgery She is still sedentary, risk of recurrent DVT, no regular exercise program Now off eliquis Recommend we use Eliquis 2.5 as needed for long trips when she is at risk of recurrent DVT Also recommended compression hose  Controlled type 2 diabetes mellitus without complication, without long-term current use of insulin (HCC) We have encouraged continued exercise, careful diet management in an effort to lose weight.  Coronary artery disease with stable angina Severe three-vessel coronary calcification seen on CT scan chest Stressed importance of aggressive diabetes, lipid control and monitoring of her shortness of breath symptoms If symptoms get worse we will order stress test or cardiac catheterization  Hyperlipidemia Cholesterol is at goal on the current lipid regimen. No changes to the medications were made. Stable   Disposition: Follow-up in 6 months   Signed, Julien Nordmann, MD  05/05/2020 9:17 PM    [facility] [address], [place], [place] [address]

## 2020-05-07 ENCOUNTER — Ambulatory Visit: Payer: Medicare HMO | Admitting: Cardiovascular Disease

## 2020-05-07 DIAGNOSIS — I25118 Atherosclerotic heart disease of native coronary artery with other forms of angina pectoris: Secondary | ICD-10-CM

## 2020-05-07 DIAGNOSIS — I2699 Other pulmonary embolism without acute cor pulmonale: Secondary | ICD-10-CM

## 2020-05-07 DIAGNOSIS — I10 Essential (primary) hypertension: Secondary | ICD-10-CM

## 2020-05-07 DIAGNOSIS — E7849 Other hyperlipidemia: Secondary | ICD-10-CM

## 2020-05-07 DIAGNOSIS — E119 Type 2 diabetes mellitus without complications: Secondary | ICD-10-CM

## 2020-05-08 NOTE — Progress Notes (Signed)
Evaluation Performed:  Follow-up visit  Date:  05/09/2020   ID:  Erica Brock, DOB 17-Jan-1951, MRN [id number]  Patient Location:  [address] [place] [address]   Provider location:   [facility], [place] office  PCP:  Leim Fabry, MD  Cardiologist:  [facility]   Chief Complaint  Patient presents with  . Follow-up    6 month  Pt states she has slight swelling in left foot/leg d/t surgery--not other Sx    History of Present Illness:    Erica Brock is a 69 y.o. female  past medical history of Coronary artery disease, severe three-vessel coronary calcification seen on CT scan, minimal aortic atherosclerosis HTN,  hyperlipidemia,  obesity,  Type 2 diabetes,  No prior smoking history Obstructive sleep apnea who wears CPAP,   family history of CAD (sister was smoker with MI and CABG)  comes for followup of her hyperlipidemia and hypertension,  pulmonary embolism in the setting of cast below the knee on her left leg  Stress at home, Son with cancer, colon  gerd causes BP to run high At home 120-130/70 to 80 Blood pressure typically higher when she comes into the office  on lisinopril 10 with clonidine 0.1 twice daily,  Weight down 16 pounds since 10/2019 Eating less, eating better  Labs reviewed on today's visit HBG 11.4 CR 1.0 Total chol 129, LDL 38,  HBA1c 6.0  EKG personally reviewed by myself on todays visit NSR rate 62 bpm no significant ST-T wave changes  Other past medical history reviewed CT chest 2018  Severe three-vessel coronary calcification Minimal aortic atherosclerosis  Prior history of Polyarthralgia, unspecified Work up for RA vs CPPD Has sx of both Reaction to Prednisone Hands bad  Had foot surgery on the left, Developed shortness of breath chest discomfort, presented to the emergency room July 18, 2017 CT scan confirming pulmonary embolism right lower lobe,  cast in place,  surgery  11/30 Cast off 08/03/17.  [facility]  Reports that she is still doing physical therapy,  Sedentary, Does some walking but not regular exercise Right lower extremity with swelling Not wearing compression hose  She had leg edema 07/19/2013 and took Lasix. December 31 had high blood pressure, neck pain, headache. She went to the emergency room and reports systolic pressure was more than 200. She was given extra clonidine. She went home and slept on new years day.   Previous blood work showed low sodium, low potassium on HCTZ. This was discontinued  sister has had bypass surgery   Prior CV studies:   The following studies were reviewed today:    Past Medical History:  Diagnosis Date  . Anxiety   . Arthritis   . Diabetes mellitus without complication (HCC)   . Diffuse cystic mastopathy 2012  . GERD (gastroesophageal reflux disease)   . Heart murmur    ETT myoview 12/09 (Dr. Juel Burrow) no evidence of ischemia, good exercise tolerance - 9 min. echo (11/10): EF 60-65%, nml RV, PASP 25, nml valves  . Hepatitis    as a child   . HLD (hyperlipidemia)   . HTN (hypertension)    for < 10 years  . Obesity   . OSA (obstructive sleep apnea)    mild; uses CPAP  . Rheumatic fever    as a child   Past Surgical History:  Procedure Laterality Date  . arthroscopic surgery     L elbow   .  BREAST BIOPSY Left    needle bx-neg  . carpael tunnel release     R hand  . COLONOSCOPY  2008   Dr. Sharen Hint  . FOOT SURGERY Left   . reconstruction foot and bunion surgery - both feet    . TONSILLECTOMY    . TOTAL ABDOMINAL HYSTERECTOMY  1994     Current Meds  Medication Sig  . apixaban (ELIQUIS) 2.5 MG TABS tablet Take 1 tablet (2.5 mg total) by mouth 2 (two) times daily. PRN for travel  . cetirizine (ZYRTEC) 10 MG tablet Take 10 mg by mouth daily as needed for allergies.  . cloNIDine (CATAPRES) 0.1 MG tablet Take 1 tablet (0.1 mg total) by mouth 2 (two) times daily.  Marland Kitchen ezetimibe (ZETIA)  10 MG tablet TAKE 1 TABLET DAILY  . furosemide (LASIX) 20 MG tablet Take 1 tablet (20 mg total) by mouth daily as needed.  . hydroxychloroquine (PLAQUENIL) 200 MG tablet Take 400 mg by mouth daily.   Marland Kitchen lisinopril (ZESTRIL) 10 MG tablet TAKE 1 TABLET DAILY  . metFORMIN (GLUCOPHAGE-XR) 500 MG 24 hr tablet Take 500 mg by mouth in the morning and at bedtime.   . montelukast (SINGULAIR) 10 MG tablet Take 10 mg by mouth daily as needed.  . pantoprazole (PROTONIX) 40 MG tablet Take 40 mg by mouth daily.    . rosuvastatin (CRESTOR) 40 MG tablet Take 1 tablet (40 mg total) by mouth daily.     Allergies:   Atorvastatin and Prednisone   Social History   Tobacco Use  . Smoking status: Never Smoker  . Smokeless tobacco: Never Used  . Tobacco comment: tobacco use - no  Vaping Use  . Vaping Use: Never used  Substance Use Topics  . Alcohol use: Yes    Comment: social   . Drug use: No     Family Hx: The patient's family history includes Aneurysm in her brother; Diabetes in her father; Heart attack in her sister; Hyperlipidemia in her mother and sister; Hypertension in her father and sister; Multiple sclerosis in her mother; Other in her father. There is no history of Breast cancer.  ROS:   Please see the history of present illness.    Review of Systems  Constitutional: Negative.   HENT: Negative.   Respiratory: Positive for shortness of breath.   Cardiovascular: Negative.   Gastrointestinal: Negative.   Musculoskeletal: Positive for joint pain.  Neurological: Negative.   Psychiatric/Behavioral: Negative.   All other systems reviewed and are negative.    Labs/Other Tests and Data Reviewed:    Recent Labs: No results found for requested labs within last 8760 hours.   Recent Lipid Panel Lab Results  Component Value Date/Time   CHOL 158 11/15/2015 08:43 AM   TRIG 153 (H) 11/15/2015 08:43 AM   TRIG 243 11/09/2008 12:00 AM   HDL 69 11/15/2015 08:43 AM   CHOLHDL 2.3 Ratio 09/11/2010  38:88 PM   LDLCALC 58 11/15/2015 08:43 AM    Wt Readings from Last 3 Encounters:  05/09/20 203 lb 9.6 oz (92.4 kg)  11/09/19 219 lb 2 oz (99.4 kg)  11/09/18 210 lb (95.3 kg)     Exam:    BP (!) 138/95   Pulse 62   Ht 5\' 4"  (1.626 m)   Wt 203 lb 9.6 oz (92.4 kg)   BMI 34.95 kg/m  Constitutional:  oriented to person, place, and time. No distress.  HENT:  Head: Grossly normal Eyes:  no discharge. No scleral icterus.  Neck: No JVD, no carotid bruits  Cardiovascular: Regular rate and rhythm, no murmurs appreciated Pulmonary/Chest: Clear to auscultation bilaterally, no wheezes or rails Abdominal: Soft.  no distension.  no tenderness.  Musculoskeletal: Normal range of motion Neurological:  normal muscle tone. Coordination normal. No atrophy Skin: Skin warm and dry Psychiatric: normal affect, pleasant  ASSESSMENT & PLAN:    Essential hypertension - Elevated in the office, better at home No changes made She will continue to track it  OSA on CPAP Compliant with her CPAP She is losing weight, down quite a bit of weight in the past 6 months  Pulmonary embolism Documented on CT scan December 2018 Ultrasound showed no DVT left lower extremity, site of the cast, surgery Off Eliquis Recommend we use Eliquis 2.5 as needed for long trips when she is at risk of recurrent DVT Compression hose recommended  Controlled type 2 diabetes mellitus without complication, without long-term current use of insulin (HCC) We have encouraged continued exercise, careful diet management in an effort to lose weight. Weight is trending downward  Coronary artery disease with stable angina Severe three-vessel coronary calcification seen on CT scan chest Currently with no symptoms of angina. No further workup at this time. Continue current medication regimen.  Hyperlipidemia Excellent numbers, no changes made Continue Crestor Zetia   Total encounter time more than 25 minutes  Greater than 50%  was spent in counseling and coordination of care with the patient   Signed, Julien Nordmann, MD  05/09/2020 12:50 PM    [facility] [address], [place], [place] [address]

## 2020-05-09 ENCOUNTER — Encounter: Payer: Self-pay | Admitting: Cardiovascular Disease

## 2020-05-09 ENCOUNTER — Other Ambulatory Visit: Payer: Self-pay

## 2020-05-09 ENCOUNTER — Ambulatory Visit (INDEPENDENT_AMBULATORY_CARE_PROVIDER_SITE_OTHER): Payer: Medicare HMO | Admitting: Cardiovascular Disease

## 2020-05-09 VITALS — BP 138/95 | HR 62 | Ht 64.0 in | Wt 203.6 lb

## 2020-05-09 DIAGNOSIS — I10 Essential (primary) hypertension: Secondary | ICD-10-CM

## 2020-05-09 DIAGNOSIS — E782 Mixed hyperlipidemia: Secondary | ICD-10-CM | POA: Diagnosis not present

## 2020-05-09 DIAGNOSIS — E119 Type 2 diabetes mellitus without complications: Secondary | ICD-10-CM

## 2020-05-09 DIAGNOSIS — I2699 Other pulmonary embolism without acute cor pulmonale: Secondary | ICD-10-CM

## 2020-05-09 DIAGNOSIS — I25118 Atherosclerotic heart disease of native coronary artery with other forms of angina pectoris: Secondary | ICD-10-CM | POA: Diagnosis not present

## 2020-05-09 NOTE — Patient Instructions (Signed)
Medication Instructions:  No changes  If you need a refill on your cardiac medications before your next appointment, please call your pharmacy.    Lab work: No new labs needed   If you have labs (blood work) drawn today and your tests are completely normal, you will receive your results only by: . MyChart Message (if you have MyChart) OR . A paper copy in the mail If you have any lab test that is abnormal or we need to change your treatment, we will call you to review the results.   Testing/Procedures: No new testing needed   Follow-Up: At [facility], you and your health needs are our priority.  As part of our continuing mission to provide you with exceptional heart care, we have created designated Provider Care Teams.  These Care Teams include your primary Cardiologist (physician) and Advanced Practice Providers (APPs -  Physician Assistants and Nurse Practitioners) who all work together to provide you with the care you need, when you need it.  . You will need a follow up appointment in 12 months  . Providers on your designated Care Team:   . Christopher Berge, NP . Ryan Dunn, PA-C . Jacquelyn Visser, PA-C  Any Other Special Instructions Will Be Listed Below (If Applicable).  COVID-19 Vaccine Information can be found at: [email or web address] For questions related to vaccine distribution or appointments, please email [email or web address] or call [phone].     

## 2020-07-12 ENCOUNTER — Other Ambulatory Visit: Payer: Self-pay | Admitting: Cardiovascular Disease

## 2020-08-30 ENCOUNTER — Telehealth: Payer: Self-pay | Admitting: Cardiovascular Disease

## 2020-08-30 NOTE — Telephone Encounter (Signed)
Was able to reach back out to Erica Brock regarding her BP concerns. Erica Brock reports woke up yesterday morning with a severe headache and it has continue to be intermieent since yesterday, at times better. Pt reports since headache her BP has been elevated 187/86 and 156/88. Pt reports thinks her BP would improve if her headache would resolve. Pt takes clonidine 0.1 mg BID and lisinopril 10 mg daily. Pt reports at one time she use to take 40 mg tab of lisinopril and still had some of those pills left over from 2019, so she took a 40 mg tab yesterday and today, but her BP did not improve with high dosing. Both pills are expired, advised pt that the 40 mg lisinopril probably did not work effectively since they are expired from 2019 and they lose their potency over time. Pt verbalized understanding. Dr. Anselm Jungling with Gillian Shields, NP, she advised that pt take 20 mg lisinopril daily for reduce BP and tylenol for headache relief. Pt reports she takes 1000 mg Tylenol BID for arthritis relief and 100 mg IBU as well. Advised pt based on her current weight and no hx of kidney issues she can take up to   weight based dosing at 800mg  IBU. Pt verbalized understanding. Advised to take headache medication with some caffeine and lay down to rest/nap for 85mins-1hr to see if that helps relieve her headache and then recheck BP after resting. Pt reports she will try this and take the 20 mg lisinopril daily and f/u with 31m, Tuesday 2/15 at 1:30pm. Otherwise all questions or concerns were address and no additional concerns at this time. Agreeable to plan, will call back for anything further.

## 2020-08-30 NOTE — Telephone Encounter (Signed)
Pt c/o BP issue: STAT if pt c/o blurred vision, one-sided weakness or slurred speech  1. What are your last 5 BP readings?  187/86  156/88   2. Are you having any other symptoms (ex. Dizziness, headache, blurred vision, passed out)?  Headache nausea yesterday   3. What is your BP issue? Elevated since yesterday   Patient states she took 40 extra mg of lisinopril and this has not helped decrease bp   Scheduled 2-15 with walker   Please call

## 2020-09-03 ENCOUNTER — Telehealth: Payer: Self-pay

## 2020-09-03 NOTE — Telephone Encounter (Signed)
Request has been approved tier coverage  At tier 01 for ezetimibe tablets from 07/21/2020-07/20/2021

## 2020-09-04 ENCOUNTER — Ambulatory Visit: Payer: Medicare HMO | Admitting: Family

## 2020-09-04 ENCOUNTER — Other Ambulatory Visit
Admission: RE | Admit: 2020-09-04 | Discharge: 2020-09-04 | Disposition: A | Discharge status: Home / Self Care | Payer: Medicare HMO | Source: Ambulatory Visit | Attending: Family | Admitting: Family

## 2020-09-04 ENCOUNTER — Other Ambulatory Visit: Payer: Self-pay

## 2020-09-04 ENCOUNTER — Encounter: Payer: Self-pay | Admitting: Family

## 2020-09-04 VITALS — BP 162/100 | HR 71 | Ht 64.0 in | Wt 197.0 lb

## 2020-09-04 DIAGNOSIS — R0600 Dyspnea, unspecified: Secondary | ICD-10-CM | POA: Diagnosis not present

## 2020-09-04 DIAGNOSIS — E782 Mixed hyperlipidemia: Secondary | ICD-10-CM

## 2020-09-04 DIAGNOSIS — G4733 Obstructive sleep apnea (adult) (pediatric): Secondary | ICD-10-CM

## 2020-09-04 DIAGNOSIS — I10 Essential (primary) hypertension: Secondary | ICD-10-CM | POA: Diagnosis present

## 2020-09-04 DIAGNOSIS — R0609 Other forms of dyspnea: Secondary | ICD-10-CM

## 2020-09-04 LAB — CBC
HCT: 38.5 % (ref 36.0–46.0)
Hemoglobin: 12.7 g/dL (ref 12.0–15.0)
MCH: 30.6 pg (ref 26.0–34.0)
MCHC: 33 g/dL (ref 30.0–36.0)
MCV: 92.8 fL (ref 80.0–100.0)
Platelets: 263 10*3/uL (ref 150–400)
RBC: 4.15 MIL/uL (ref 3.87–5.11)
RDW: 13.1 % (ref 11.5–15.5)
WBC: 6.3 10*3/uL (ref 4.0–10.5)
nRBC: 0 % (ref 0.0–0.2)

## 2020-09-04 LAB — COMPREHENSIVE METABOLIC PANEL
ALT: 17 U/L (ref 0–44)
AST: 23 U/L (ref 15–41)
Albumin: 5.1 g/dL — ABNORMAL HIGH (ref 3.5–5.0)
Alkaline Phosphatase: 45 U/L (ref 38–126)
Anion gap: 13 (ref 5–15)
BUN: 16 mg/dL (ref 8–23)
CO2: 24 mmol/L (ref 22–32)
Calcium: 10.1 mg/dL (ref 8.9–10.3)
Chloride: 100 mmol/L (ref 98–111)
Creatinine, Ser: 0.61 mg/dL (ref 0.44–1.00)
GFR, Estimated: >60 mL/min (ref >60)
Glucose, Bld: 87 mg/dL (ref 70–99)
Potassium: 4.7 mmol/L (ref 3.5–5.1)
Sodium: 137 mmol/L (ref 135–145)
Total Bilirubin: 0.6 mg/dL (ref 0.3–1.2)
Total Protein: 8 g/dL (ref 6.5–8.1)

## 2020-09-04 LAB — TSH: TSH: 0.962 u[IU]/mL (ref 0.350–4.500)

## 2020-09-04 MED ORDER — LISINOPRIL 40 MG PO TABS: 40.0000 mg | ORAL_TABLET | Freq: Every day | ORAL | 1 refills | Status: DC

## 2020-09-04 NOTE — Patient Instructions (Addendum)
Medication Instructions:  Your physician has recommended you make the following change in your medication:   CHANGE Lisinopril to 40mg  daily  CONTINUE Clonidine 0.1mg  twice daily Please make sure doses are twelve hours apart.  CONTINUE Lasix as-needed for swelling  *If you need a refill on your cardiac medications before your next appointment, please call your pharmacy*  Lab Work: Your provider recommends lab work today: CMP, CBC, TSH  If you have labs (blood work) drawn today and your tests are completely normal, you will receive your results only by: MyChart Message (if you have MyChart) OR . A paper copy in the mail If you have any lab test that is abnormal or we need to change your treatment, we will call you to review the results.   Testing/Procedures: Your EKG today showed normal sinus rhythm.   Your physician has requested that you have an echocardiogram. Echocardiography is a painless test that uses sound waves to create images of your heart. It provides your doctor with information about the size and shape of your heart and how well your heart's chambers and valves are working. This procedure takes approximately one hour. There are no restrictions for this procedure.  Follow-Up: At [facility], you and your health needs are our priority.  As part of our continuing mission to provide you with exceptional heart care, we have created designated Provider Care Teams.  These Care Teams include your primary Cardiologist (physician) and Advanced Practice Providers (APPs -  Physician Assistants and Nurse Practitioners) who all work together to provide you with the care you need, when you need it.  We recommend signing up for the patient portal called "MyChart".  Sign up information is provided on this After Visit Summary.  MyChart is used to connect with patients for Virtual Visits (Telemedicine).  Patients are able to view lab/test results, encounter notes, upcoming appointments,  etc.  Non-urgent messages can be sent to your provider as well.   To learn more about what you can do with MyChart, go to [facility].    Your next appointment:   2-3 week(s)  The format for your next appointment:   In Person  Provider:   You may see [email or web address], MD or one of the following Advanced Practice Providers on your designated Care Team:    Julien Nordmann, NP  Nicolasa Ducking, PA-C  Eula Listen, PA-C  Cadence Marisue Ivan, Fransico Michael  [place], NP  Other Instructions  Check your blood pressure once per day and keep a log.  Tips to Measure your Blood Pressure Correctly  Here's what you can do to ensure a correct reading: . Don't drink a caffeinated beverage or smoke during the 30 minutes before the test. . Sit quietly for five minutes before the test begins. . During the measurement, sit in a chair with your feet on the floor and your arm supported so your elbow is at about heart level. . The inflatable part of the cuff should completely cover at least 80% of your upper arm, and the cuff should be placed on bare skin, not over a shirt. . Don't talk during the measurement. . Have your blood pressure measured twice, with a brief break in between. If the readings are different by 5 points or more, have it done a third time.  There are times to break these rules. If you sometimes feel lightheaded when getting out of bed in the morning or when you stand after sitting, you should have your blood pressure  checked while seated and then while standing to see if it falls from one position to the next.  In 2017, new guidelines from the American Heart Association, the [employer] of Cardiology, and nine other health organizations lowered the diagnosis of high blood pressure to 130/80 mm Hg or higher for all adults. The guidelines also redefined the various blood pressure categories to now include normal, elevated, Stage 1 hypertension, Stage 2 hypertension, and  hypertensive crisis (see "Blood pressure categories").  Blood pressure categories  Blood pressure category SYSTOLIC (upper number)  DIASTOLIC (lower number)  Normal Less than 120 mm Hg and Less than 80 mm Hg  Elevated 120-129 mm Hg and Less than 80 mm Hg  High blood pressure: Stage 1 hypertension 130-139 mm Hg or 80-89 mm Hg  High blood pressure: Stage 2 hypertension 140 mm Hg or higher or 90 mm Hg or higher  Hypertensive crisis (consult your doctor immediately) Higher than 180 mm Hg and/or Higher than 120 mm Hg  Source: American Heart Association and American Stroke Association. For more on getting your blood pressure under control, buy Controlling Your Blood Pressure, a Special Health Report from [facility].   Blood Pressure Log   Date   Time  Blood Pressure  Position  Example: Nov 1 9 AM 124/78 sitting

## 2020-09-04 NOTE — Progress Notes (Signed)
Office Visit    Patient Name: Erica Brock Date of Encounter: 09/04/2020  PCP:  Leim Fabry, MD   [facility]  Cardiologist:  Julien Nordmann, MD  Advanced Practice Provider:  No care team member to display Electrophysiologist:  None   Chief Complaint    Erica Brock is a 70 y.o. female with a hx of coronary artery disease with severe three-vessel coronary calcification on CT scan, aortic atherosclerosis, hypertension, hyperlipidemia, obesity, type 2 diabetes, pulmonary embolism in 2018 in setting of surgery, rheumatoid arthritis, osteoarthritis presents today for elevated blood pressure  Past Medical History    Past Medical History:  Diagnosis Date  . Anxiety   . Arthritis   . Diabetes mellitus without complication (HCC)   . Diffuse cystic mastopathy 2012  . GERD (gastroesophageal reflux disease)   . Heart murmur    ETT myoview 12/09 (Dr. Juel Burrow) no evidence of ischemia, good exercise tolerance - 9 min. echo (11/10): EF 60-65%, nml RV, PASP 25, nml valves  . Hepatitis    as a child   . HLD (hyperlipidemia)   . HTN (hypertension)    for < 10 years  . Obesity   . OSA (obstructive sleep apnea)    mild; uses CPAP  . Rheumatic fever    as a child   Past Surgical History:  Procedure Laterality Date  . arthroscopic surgery     L elbow   . BREAST BIOPSY Left    needle bx-neg  . carpael tunnel release     R hand  . COLONOSCOPY  2008   Dr. Sharen Hint  . FOOT SURGERY Left   . reconstruction foot and bunion surgery - both feet    . TONSILLECTOMY    . TOTAL ABDOMINAL HYSTERECTOMY  1994    Allergies  Allergies  Allergen Reactions  . Atorvastatin     Muscle aches   . Prednisone     History of Present Illness    Erica Brock is a 70 y.o. female with a hx of coronary artery disease with severe three-vessel coronary calcification on CT scan, aortic atherosclerosis, hypertension, hyperlipidemia, obesity, type 2 diabetes, pulmonary  embolism in 2018 in setting of surgery, rheumatoid arthritis, osteoarthritis.  She was last seen 05/09/2020 by Dr. Mariah Milling.  She has no smoking history.  Does have family history notable for coronary artery disease with sister with MI and CABG.  She had a pulmonary embolism in 2018 in the setting of surgery and completed course of anticoagulation.  She has had difficulty with her blood pressure in the past with titration of both clonidine and lisinopril.  She called the office last week noting elevated blood pressures as well as headache.  She was recommended to utilize Tylenol and ibuprofen for headache.  She was recommended to increase her lisinopril to 20 mg daily until seen in follow-up.  Labs via care everywhere  05/11/2020 hemoglobin 11.5, hematocrit 35.1  05/11/2020 creatinine 0.8, GFR 75, AST 24, ALT 15, potassium 4.9, sodium 136  05/11/2020 A1C 5.9  05/11/2020 total cholesterol 117, triglyceride 95, LDL 28, HDL 70  Reports at home her blood pressure is now not as high as it was. Previous 189/95 and after increasing her Lisinopril to 20 mg it has improved some but not to goal. This morning was 154/97. Last night she got a reading of 99/70. She denies lightheadedness, dizziness, near syncope, syncope. Notes a headache in the morning about 30 minutes after waking that is  mild. Endorses stress with her son having rectal cancer. She does note she will wake up with indigestion and routinely will have elevated BP when that happens, she has been compliant with her Pantoprazole. Overall on home readings her blood pressure is very labile. She checks with both her arm and wrist cuff, encouraged to bring to next appointment.  Reports no shortness of breath nor dyspnea on exertion. Reports no chest pain, pressure, or tightness. No edema, PND. Reports no palpitations. Reports mild orthopnea when she went to lay back recently. Tells me it occurred for a few nights but has resolved. Endorses compliance  with her CPAP.   EKGs/Labs/Other Studies Reviewed:   The following studies were reviewed today:   EKG:  EKG is  ordered today.  The ekg ordered today demonstrates NSR 71 bpm with left axis deviation as well as minimal voltage criteria for LVH, poor R wave progression.  Recent Labs: No results found for requested labs within last 8760 hours.  Recent Lipid Panel    Component Value Date/Time   CHOL 158 11/15/2015 0843   TRIG 153 (H) 11/15/2015 0843   TRIG 243 11/09/2008 0000   HDL 69 11/15/2015 0843   CHOLHDL 2.3 Ratio 09/11/2010 2141   VLDL 19 09/11/2010 2141   LDLCALC 58 11/15/2015 0843    Home Medications   Current Meds  Medication Sig  . apixaban (ELIQUIS) 2.5 MG TABS tablet Take 1 tablet (2.5 mg total) by mouth 2 (two) times daily. PRN for travel  . cloNIDine (CATAPRES) 0.1 MG tablet Take 1 tablet (0.1 mg total) by mouth 2 (two) times daily.  Marland Kitchen ezetimibe (ZETIA) 10 MG tablet TAKE 1 TABLET DAILY  . furosemide (LASIX) 20 MG tablet Take 1 tablet (20 mg total) by mouth daily as needed.  Marland Kitchen lisinopril (ZESTRIL) 20 MG tablet Take 20 mg by mouth daily.  . metFORMIN (GLUCOPHAGE-XR) 500 MG 24 hr tablet Take 500 mg by mouth at bedtime.  . montelukast (SINGULAIR) 10 MG tablet Take 10 mg by mouth daily as needed.  . pantoprazole (PROTONIX) 40 MG tablet Take 40 mg by mouth daily.    . rosuvastatin (CRESTOR) 40 MG tablet Take 1 tablet (40 mg total) by mouth daily.     Review of Systems  All other systems reviewed and are otherwise negative except as noted above.  Physical Exam    VS:  BP (!) 196/106 (BP Location: Right Arm, Patient Position: Sitting, Cuff Size: Large)   Pulse 71   Ht 5\' 4"  (1.626 m)   Wt 197 lb (89.4 kg)   SpO2 97%   BMI 33.81 kg/m  , BMI Body mass index is 33.81 kg/m.  Wt Readings from Last 3 Encounters:  09/04/20 197 lb (89.4 kg)  05/09/20 203 lb 9.6 oz (92.4 kg)  11/09/19 219 lb 2 oz (99.4 kg)    GEN: Well nourished, overweight, well developed, in no  acute distress. HEENT: normal. Neck: Supple, no JVD, carotid bruits, or masses. Cardiac: RRR, no murmurs, rubs, or gallops. No clubbing, cyanosis, edema.  Radials/PT 2+ and equal bilaterally.  Respiratory:  Respirations regular and unlabored, clear to auscultation bilaterally. GI: Soft, nontender, nondistended. MS: No deformity or atrophy. Skin: Warm and dry, no rash. Neuro:  Strength and sensation are intact. Psych: Normal affect.  Assessment & Plan    1. HTN -BP markedly elevated. Initial BP 196/106 with repeat BP reading 162/100 without intervention. EKG today NSR with possible LVH. Plan for echocardiogram. Lab work today including CMP,  CBC, TSH to rule out abnormality causing elevated blood pressure. Recommend low-salt, heart healthy diet. Increase lisinopril to 40 mg daily. Continue clonidine 0.1 mg twice daily. Of note, she is taking her dose at 7 AM and again at 5 PM which is likely why she is getting some hypotensive blood pressure readings in the afternoon. Encouraged to take 12 hours apart though if this becomes difficult may have to transition to a different agent. Prompt follow-up in 2 weeks with repeat BMP at that time. If BP remains uncontrolled consider addition of amlodipine. If BP remains markedly difficult to control may require renal ultrasound.  2. OSA on CPAP -continue with CPAP compliance encouraged. She reports compliance.  3. PE - By CT 06/2017. Completed course of Eliquis. On Eliquis 2.5mg  PRN for long trips to prevent recurrent VE per her primary cardiologist. No signs of recurrent VE.  4. DM2 -05/11/2020 A1c 5.9. Continue to follow with primary care provider.  5. CAD - Three-vessel coronary calcification noted on CT. EKG today with no acute ST/T wave changes. No indication for ischemic evaluation at this time she reports no anginal symptoms. GDMT includes Crestor 40 mg daily, Zetia 10 mg daily  6. HLD -lipid panel 05/11/2020 with LDL 28. Continue Crestor 40 mg daily  and Zetia 10 mg daily.  Disposition: Follow up in 3 week(s) with Dr. Mariah Milling or APP.   Signed, Alver Sorrow, NP 09/04/2020, 1:56 PM [facility]

## 2020-09-06 ENCOUNTER — Other Ambulatory Visit: Payer: Self-pay | Admitting: Family

## 2020-09-06 DIAGNOSIS — R0609 Other forms of dyspnea: Secondary | ICD-10-CM

## 2020-09-06 DIAGNOSIS — R0600 Dyspnea, unspecified: Secondary | ICD-10-CM

## 2020-09-10 ENCOUNTER — Encounter: Payer: Self-pay | Admitting: Family

## 2020-09-10 NOTE — Telephone Encounter (Signed)
Patient calling in to make sure this message is seen.   Please advise

## 2020-09-19 ENCOUNTER — Other Ambulatory Visit: Payer: Self-pay

## 2020-09-19 ENCOUNTER — Ambulatory Visit (INDEPENDENT_AMBULATORY_CARE_PROVIDER_SITE_OTHER): Payer: Medicare HMO

## 2020-09-19 DIAGNOSIS — R0609 Other forms of dyspnea: Secondary | ICD-10-CM

## 2020-09-19 DIAGNOSIS — R0600 Dyspnea, unspecified: Secondary | ICD-10-CM | POA: Diagnosis not present

## 2020-09-19 LAB — ECHOCARDIOGRAM COMPLETE
AR max vel: 3.02 cm2
AV Area VTI: 2.91 cm2
AV Area mean vel: 2.98 cm2
AV Mean grad: 3 mmHg
AV Peak grad: 5.5 mmHg
Ao pk vel: 1.17 m/s
Area-P 1/2: 3.26 cm2
Calc EF: 50 %
MV VTI: 3.08 cm2
S' Lateral: 2.5 cm
Single Plane A2C EF: 52.7 %
Single Plane A4C EF: 49.8 %

## 2020-09-20 ENCOUNTER — Telehealth: Payer: Self-pay | Admitting: *Deleted

## 2020-09-20 NOTE — Telephone Encounter (Signed)
Patient returning voicemail

## 2020-09-20 NOTE — Telephone Encounter (Signed)
Reviewed results with patient and she verbalized understanding. She had one elevated BP today 158/91 but all other readings were normal. Recommended that she bring in list of those readings to her upcoming appointment so she can review those and determine if any additional changes need to be made. She verbalized understanding and confirmed appointment for next week.

## 2020-09-20 NOTE — Telephone Encounter (Signed)
Left voicemail message to call back for review of results.  

## 2020-09-20 NOTE — Telephone Encounter (Signed)
-----   Message from Alver Sorrow, NP sent at 09/20/2020  7:34 AM EST ----- Echocardiogram shows normal heart pumping function. Heart muscle is mildly thick which is likely due to history of high blood pressure. Mitral valve mildly leaky, though no significant valvular abnormalities. Good result!

## 2020-09-25 ENCOUNTER — Ambulatory Visit (INDEPENDENT_AMBULATORY_CARE_PROVIDER_SITE_OTHER): Payer: Medicare HMO | Admitting: Family

## 2020-09-25 ENCOUNTER — Other Ambulatory Visit: Payer: Self-pay

## 2020-09-25 ENCOUNTER — Encounter: Payer: Self-pay | Admitting: Family

## 2020-09-25 VITALS — BP 152/98 | HR 70 | Ht 64.0 in | Wt 199.0 lb

## 2020-09-25 DIAGNOSIS — I25118 Atherosclerotic heart disease of native coronary artery with other forms of angina pectoris: Secondary | ICD-10-CM | POA: Diagnosis not present

## 2020-09-25 DIAGNOSIS — I10 Essential (primary) hypertension: Secondary | ICD-10-CM | POA: Diagnosis not present

## 2020-09-25 DIAGNOSIS — E782 Mixed hyperlipidemia: Secondary | ICD-10-CM

## 2020-09-25 DIAGNOSIS — G4733 Obstructive sleep apnea (adult) (pediatric): Secondary | ICD-10-CM | POA: Diagnosis not present

## 2020-09-25 MED ORDER — LISINOPRIL 40 MG PO TABS: 40.0000 mg | ORAL_TABLET | Freq: Every day | ORAL | 1 refills | Status: DC

## 2020-09-25 NOTE — Patient Instructions (Addendum)
Medication Instructions:  No medication changes today.   We have sent a refill of Lisinopril to [facility].   *If you need a refill on your cardiac medications before your next appointment, please call your pharmacy*   Lab Work: None ordered today.   Testing/Procedures: Your echocardiogram showed normal heart pumping function which is a good result!   Follow-Up: At [facility], you and your health needs are our priority.  As part of our continuing mission to provide you with exceptional heart care, we have created designated Provider Care Teams.  These Care Teams include your primary Cardiologist (physician) and Advanced Practice Providers (APPs -  Physician Assistants and Nurse Practitioners) who all work together to provide you with the care you need, when you need it.  We recommend signing up for the patient portal called "MyChart".  Sign up information is provided on this After Visit Summary.  MyChart is used to connect with patients for Virtual Visits (Telemedicine).  Patients are able to view lab/test results, encounter notes, upcoming appointments, etc.  Non-urgent messages can be sent to your provider as well.   To learn more about what you can do with MyChart, go to [email or web address].    Your next appointment:   6 month(s)  The format for your next appointment:   In Person  Provider:   You may see Julien Nordmann, MD or one of the following Advanced Practice Providers on your designated Care Team:    Nicolasa Ducking, NP  Eula Listen, PA-C  Marisue Ivan, PA-C  Cadence Fransico Michael, [place]  Gillian Shields, NP   Other Instructions  Heart Healthy Diet Recommendations: A low-salt diet is recommended. Meats should be grilled, baked, or boiled. Avoid fried foods. Focus on lean protein sources like fish or chicken with vegetables and fruits. The American Heart Association is a Chief Technology Officer!  American Heart Association Diet and Lifeystyle Recommendations    Exercise recommendations: The American Heart Association recommends 150 minutes of moderate intensity exercise weekly. Try 30 minutes of moderate intensity exercise 4-5 times per week. This could include walking, jogging, or swimming.

## 2020-09-25 NOTE — Progress Notes (Signed)
Office Visit    Patient Name: Erica Brock Date of Encounter: 09/25/2020  PCP:  Leim Fabry, MD   [facility]  Cardiologist:  Julien Nordmann, MD   Chief Complaint    Erica Brock is a 70 y.o. female with a hx of coronary artery disease with severe three-vessel coronary calcification on CT scan, aortic atherosclerosis, hypertension, hyperlipidemia, obesity, type 2 diabetes, pulmonary embolism in 2018 in setting of surgery, rheumatoid arthritis, osteoarthritis presents today for hypertension follow-up  Past Medical History    Past Medical History:  Diagnosis Date   Anxiety    Arthritis    Diabetes mellitus without complication (HCC)    Diffuse cystic mastopathy 2012   GERD (gastroesophageal reflux disease)    Heart murmur    ETT myoview 12/09 (Dr. Juel Burrow) no evidence of ischemia, good exercise tolerance - 9 min. echo (11/10): EF 60-65%, nml RV, PASP 25, nml valves   Hepatitis    as a child    HLD (hyperlipidemia)    HTN (hypertension)    for < 10 years   Obesity    OSA (obstructive sleep apnea)    mild; uses CPAP   Rheumatic fever    as a child   Past Surgical History:  Procedure Laterality Date   arthroscopic surgery     L elbow    BREAST BIOPSY Left    needle bx-neg   carpael tunnel release     R hand   COLONOSCOPY  2008   Dr. Sharen Hint   FOOT SURGERY Left    reconstruction foot and bunion surgery - both feet     TONSILLECTOMY     TOTAL ABDOMINAL HYSTERECTOMY  1994    Allergies  Allergies  Allergen Reactions   Atorvastatin     Muscle aches    Prednisone     History of Present Illness    Erica Brock is a 70 y.o. female with a hx of coronary artery disease with severe three-vessel coronary calcification on CT scan, aortic atherosclerosis, hypertension, hyperlipidemia, obesity, type 2 diabetes, pulmonary embolism in 2018 in setting of surgery, rheumatoid arthritis, osteoarthritis.  She was last seen  09/04/2020.  She has no smoking history.  Does have family history notable for coronary artery disease with sister with MI and CABG.  She had a pulmonary embolism in 2018 in the setting of surgery and completed course of anticoagulation.  She has had difficulty with her blood pressure in the past with titration of both clonidine and lisinopril.  She called the office noting elevated blood pressures and was seen in clinic 2/50/22.  Home blood pressures were reading 150s to 180s systolic.  She was overall asymptomatic but did note a headache.  She was recommended continue clonidine 0.1 mg twice daily and lisinopril was increased to 40 mg.   Since that time she has sent a MyChart messages showing her blood pressure is well controlled.  Presents today for follow up.  Home blood pressure readings routinely 110s-120s/70s-80s when checked with arm cuff.  Her arm cuff was checked in clinic today for accuracy and found that her arm cuff read 10 points higher systolic and diastolic than our manual reading.. Reports no shortness of breath at rest.  Stable dyspnea on exertion with usual activity.  She is overall very sedentary and we discussed the importance of an exercise regimen.. Reports no chest pain, pressure, or tightness. No edema, orthopnea, PND. Reports no palpitations, lightheadedness, dizziness, near-syncope, syncope.  EKGs/Labs/Other Studies Reviewed:   The following studies were reviewed today:   EKG:  No EKG is  ordered today.  The ekg independently reviewed from 09/04/2020 demonstrated NSR 71 bpm with left axis deviation as well as minimal voltage criteria for LVH, poor R wave progression.  Recent Labs: 09/04/2020: ALT 17; BUN 16; Creatinine, Ser 0.61; Hemoglobin 12.7; Platelets 263; Potassium 4.7; Sodium 137; TSH 0.962  Recent Lipid Panel    Component Value Date/Time   CHOL 158 11/15/2015 0843   TRIG 153 (H) 11/15/2015 0843   TRIG 243 11/09/2008 0000   HDL 69 11/15/2015 0843   CHOLHDL  2.3 Ratio 09/11/2010 9892   VLDL 19 09/11/2010 2141   LDLCALC 58 11/15/2015 0843    Home Medications   Current Meds  Medication Sig   apixaban (ELIQUIS) 2.5 MG TABS tablet Take 1 tablet (2.5 mg total) by mouth 2 (two) times daily. PRN for travel   cloNIDine (CATAPRES) 0.1 MG tablet Take 1 tablet (0.1 mg total) by mouth 2 (two) times daily.   ezetimibe (ZETIA) 10 MG tablet TAKE 1 TABLET DAILY   furosemide (LASIX) 20 MG tablet Take 1 tablet (20 mg total) by mouth daily as needed.   metFORMIN (GLUCOPHAGE-XR) 500 MG 24 hr tablet Take 500 mg by mouth at bedtime.   montelukast (SINGULAIR) 10 MG tablet Take 10 mg by mouth daily as needed.   pantoprazole (PROTONIX) 40 MG tablet Take 40 mg by mouth daily.     rosuvastatin (CRESTOR) 40 MG tablet Take 1 tablet (40 mg total) by mouth daily.   [DISCONTINUED] lisinopril (ZESTRIL) 40 MG tablet Take 1 tablet (40 mg total) by mouth daily.     Review of Systems  All other systems reviewed and are otherwise negative except as noted above.  Physical Exam    VS:  BP (!) 152/98 (BP Location: Right Arm, Patient Position: Sitting, Cuff Size: Normal)    Pulse 70    Ht 5\' 4"  (1.626 m)    Wt 199 lb (90.3 kg)    SpO2 97%    BMI 34.16 kg/m  , BMI Body mass index is 34.16 kg/m.  Wt Readings from Last 3 Encounters:  09/25/20 199 lb (90.3 kg)  09/04/20 197 lb (89.4 kg)  05/09/20 203 lb 9.6 oz (92.4 kg)    GEN: Well nourished, overweight, well developed, in no acute distress. HEENT: normal. Neck: Supple, no JVD, carotid bruits, or masses. Cardiac: RRR, no murmurs, rubs, or gallops. No clubbing, cyanosis, edema.  Radials/PT 2+ and equal bilaterally.  Respiratory:  Respirations regular and unlabored, clear to auscultation bilaterally. GI: Soft, nontender, nondistended. MS: No deformity or atrophy. Skin: Warm and dry, no rash. Neuro:  Strength and sensation are intact. Psych: Normal affect.  Assessment & Plan    1. HTN - At last clinic visit  clonidine 0.1 mg twice daily discontinued and lisinopril was increased to 40 mg daily.  Her CMP, CBC, TSH were unremarkable at that time.  Subsequent echo with normal LVEF, mild LVH, mild MR. She brought her home arm cuff today and it was found to be 10 points higher on systolic as well as diastolic readings.  Blood pressure today in clinic elevated 152/90.  However, home blood pressure readings are routinely 110-120s / 60s-80s.  As such, we will continue her present antihypertensive regimen. Refill of Lisinopril provided.  She has upcoming appointment with her primary care provider in a few weeks and plans to have renal function repeated at that time.  2. OSA on CPAP - Continued CPAP compliance encouraged. She reports compliance.  Does note that she was getting headache when using a CPAP cleaner and has gone back to clean it to herself with no recurrent headache.  3. PE - By CT 06/2017. Completed course of Eliquis. On Eliquis 2.5mg  PRN for long trips to prevent recurrent VE per her primary cardiologist. No signs of recurrent VE. Denies bleeding complications  4. DM2 -05/11/2020 A1c 5.9. Continue to follow with primary care provider.  5. CAD - Three-vessel coronary calcification noted on CT. No indication for ischemic evaluation at this time she reports no anginal symptoms. GDMT includes Crestor 40 mg daily, Zetia 10 mg daily  6. HLD -lipid panel 05/11/2020 with LDL 28. Continue Crestor 40 mg daily and Zetia 10 mg daily.  Disposition: Follow up in 6 month(s) with Dr. Mariah Milling or APP.   Signed, Alver Sorrow, NP 09/25/2020, 2:18 PM [facility]

## 2020-09-27 ENCOUNTER — Other Ambulatory Visit: Payer: Self-pay | Admitting: Family

## 2020-09-27 DIAGNOSIS — I10 Essential (primary) hypertension: Secondary | ICD-10-CM

## 2020-10-26 ENCOUNTER — Other Ambulatory Visit: Payer: Self-pay | Admitting: Cardiovascular Disease

## 2020-12-10 ENCOUNTER — Other Ambulatory Visit: Payer: Self-pay

## 2020-12-10 ENCOUNTER — Observation Stay
Admission: EM | Admit: 2020-12-10 | Discharge: 2020-12-11 | Disposition: A | Discharge status: Home / Self Care | Payer: Medicare HMO | Attending: Internal Medicine | Admitting: Internal Medicine

## 2020-12-10 ENCOUNTER — Encounter: Payer: Self-pay | Admitting: Emergency Medicine

## 2020-12-10 DIAGNOSIS — I25119 Atherosclerotic heart disease of native coronary artery with unspecified angina pectoris: Principal | ICD-10-CM | POA: Insufficient documentation

## 2020-12-10 DIAGNOSIS — Z79899 Other long term (current) drug therapy: Secondary | ICD-10-CM | POA: Diagnosis not present

## 2020-12-10 DIAGNOSIS — R55 Syncope and collapse: Secondary | ICD-10-CM | POA: Insufficient documentation

## 2020-12-10 DIAGNOSIS — I10 Essential (primary) hypertension: Secondary | ICD-10-CM | POA: Diagnosis not present

## 2020-12-10 DIAGNOSIS — E119 Type 2 diabetes mellitus without complications: Secondary | ICD-10-CM | POA: Diagnosis not present

## 2020-12-10 DIAGNOSIS — Z20822 Contact with and (suspected) exposure to covid-19: Secondary | ICD-10-CM | POA: Diagnosis not present

## 2020-12-10 DIAGNOSIS — R112 Nausea with vomiting, unspecified: Secondary | ICD-10-CM

## 2020-12-10 DIAGNOSIS — R9439 Abnormal result of other cardiovascular function study: Secondary | ICD-10-CM | POA: Insufficient documentation

## 2020-12-10 DIAGNOSIS — K529 Noninfective gastroenteritis and colitis, unspecified: Secondary | ICD-10-CM | POA: Diagnosis not present

## 2020-12-10 DIAGNOSIS — R079 Chest pain, unspecified: Secondary | ICD-10-CM

## 2020-12-10 DIAGNOSIS — Z7901 Long term (current) use of anticoagulants: Secondary | ICD-10-CM | POA: Insufficient documentation

## 2020-12-10 DIAGNOSIS — Z7984 Long term (current) use of oral hypoglycemic drugs: Secondary | ICD-10-CM | POA: Diagnosis not present

## 2020-12-10 DIAGNOSIS — I214 Non-ST elevation (NSTEMI) myocardial infarction: Secondary | ICD-10-CM

## 2020-12-10 LAB — CBC
HCT: 40.8 % (ref 36.0–46.0)
Hemoglobin: 14 g/dL (ref 12.0–15.0)
MCH: 30.6 pg (ref 26.0–34.0)
MCHC: 34.3 g/dL (ref 30.0–36.0)
MCV: 89.1 fL (ref 80.0–100.0)
Platelets: 403 10*3/uL — ABNORMAL HIGH (ref 150–400)
RBC: 4.58 MIL/uL (ref 3.87–5.11)
RDW: 12.7 % (ref 11.5–15.5)
WBC: 17.8 10*3/uL — ABNORMAL HIGH (ref 4.0–10.5)
nRBC: 0 % (ref 0.0–0.2)

## 2020-12-10 LAB — TROPONIN I (HIGH SENSITIVITY)
Troponin I (High Sensitivity): 91 ng/L — ABNORMAL HIGH (ref <18)
Troponin I (High Sensitivity): 260 ng/L (ref <18)
Troponin I (High Sensitivity): 329 ng/L (ref <18)

## 2020-12-10 LAB — COMPREHENSIVE METABOLIC PANEL
ALT: 30 U/L (ref 0–44)
AST: 38 U/L (ref 15–41)
Albumin: 5.4 g/dL — ABNORMAL HIGH (ref 3.5–5.0)
Alkaline Phosphatase: 56 U/L (ref 38–126)
Anion gap: 16 — ABNORMAL HIGH (ref 5–15)
BUN: 15 mg/dL (ref 8–23)
CO2: 21 mmol/L — ABNORMAL LOW (ref 22–32)
Calcium: 10.2 mg/dL (ref 8.9–10.3)
Chloride: 99 mmol/L (ref 98–111)
Creatinine, Ser: 1.04 mg/dL — ABNORMAL HIGH (ref 0.44–1.00)
GFR, Estimated: 57 mL/min — ABNORMAL LOW (ref >60)
Glucose, Bld: 147 mg/dL — ABNORMAL HIGH (ref 70–99)
Potassium: 3.6 mmol/L (ref 3.5–5.1)
Sodium: 136 mmol/L (ref 135–145)
Total Bilirubin: 0.7 mg/dL (ref 0.3–1.2)
Total Protein: 8.8 g/dL — ABNORMAL HIGH (ref 6.5–8.1)

## 2020-12-10 LAB — CBG MONITORING, ED: Glucose-Capillary: 112 mg/dL — ABNORMAL HIGH (ref 70–99)

## 2020-12-10 LAB — LIPASE, BLOOD: Lipase: 47 U/L (ref 11–51)

## 2020-12-10 LAB — GLUCOSE, CAPILLARY: Glucose-Capillary: 128 mg/dL — ABNORMAL HIGH (ref 70–99)

## 2020-12-10 LAB — HEMOGLOBIN A1C
Hgb A1c MFr Bld: 6.3 % — ABNORMAL HIGH (ref 4.8–5.6)
Mean Plasma Glucose: 134.11 mg/dL

## 2020-12-10 MED ORDER — ACETAMINOPHEN 650 MG RE SUPP: 650.0000 mg | Freq: Four times a day (QID) | RECTAL | Status: DC | PRN

## 2020-12-10 MED ORDER — SODIUM CHLORIDE 0.9 % IV SOLN: INTRAVENOUS | Status: DC

## 2020-12-10 MED ORDER — ALBUTEROL SULFATE (2.5 MG/3ML) 0.083% IN NEBU
2.5000 mg | INHALATION_SOLUTION | Freq: Four times a day (QID) | RESPIRATORY_TRACT | Status: DC
  Filled 2020-12-10: qty 3

## 2020-12-10 MED ORDER — LOPERAMIDE HCL 2 MG PO CAPS
2.0000 mg | ORAL_CAPSULE | Freq: Once | ORAL | Status: AC
  Administered 2020-12-10: 2 mg via ORAL
  Filled 2020-12-10: qty 1

## 2020-12-10 MED ORDER — INSULIN ASPART 100 UNIT/ML IJ SOLN: 0.0000 [IU] | Freq: Every day | INTRAMUSCULAR | Status: DC

## 2020-12-10 MED ORDER — ONDANSETRON HCL 4 MG/2ML IJ SOLN
4.0000 mg | Freq: Once | INTRAMUSCULAR | Status: AC
  Administered 2020-12-10: 4 mg via INTRAVENOUS
  Filled 2020-12-10: qty 2

## 2020-12-10 MED ORDER — HYDRALAZINE HCL 20 MG/ML IJ SOLN: 10.0000 mg | Freq: Four times a day (QID) | INTRAMUSCULAR | Status: DC | PRN

## 2020-12-10 MED ORDER — CLONIDINE HCL 0.1 MG PO TABS
0.1000 mg | ORAL_TABLET | Freq: Two times a day (BID) | ORAL | Status: DC
  Administered 2020-12-10 – 2020-12-11 (×2): 0.1 mg via ORAL
  Filled 2020-12-10 (×2): qty 1

## 2020-12-10 MED ORDER — EZETIMIBE 10 MG PO TABS
10.0000 mg | ORAL_TABLET | Freq: Every day | ORAL | Status: DC
  Administered 2020-12-11: 10 mg via ORAL
  Filled 2020-12-10 (×2): qty 1

## 2020-12-10 MED ORDER — ONDANSETRON HCL 4 MG/2ML IJ SOLN: 4.0000 mg | Freq: Four times a day (QID) | INTRAMUSCULAR | Status: DC | PRN

## 2020-12-10 MED ORDER — PANTOPRAZOLE SODIUM 40 MG PO TBEC
40.0000 mg | DELAYED_RELEASE_TABLET | Freq: Every day | ORAL | Status: DC
  Administered 2020-12-11: 40 mg via ORAL
  Filled 2020-12-10: qty 1

## 2020-12-10 MED ORDER — ENOXAPARIN SODIUM 40 MG/0.4ML IJ SOSY
40.0000 mg | PREFILLED_SYRINGE | INTRAMUSCULAR | Status: DC
  Administered 2020-12-10: 40 mg via SUBCUTANEOUS
  Filled 2020-12-10: qty 0.4

## 2020-12-10 MED ORDER — INSULIN ASPART 100 UNIT/ML IJ SOLN
0.0000 [IU] | Freq: Three times a day (TID) | INTRAMUSCULAR | Status: DC
  Filled 2020-12-10: qty 1

## 2020-12-10 MED ORDER — LACTATED RINGERS IV BOLUS
1000.0000 mL | Freq: Once | INTRAVENOUS | Status: AC
  Administered 2020-12-10: 1000 mL via INTRAVENOUS

## 2020-12-10 MED ORDER — ACETAMINOPHEN 325 MG PO TABS
650.0000 mg | ORAL_TABLET | Freq: Four times a day (QID) | ORAL | Status: DC | PRN
  Administered 2020-12-10 – 2020-12-11 (×2): 650 mg via ORAL
  Filled 2020-12-10 (×2): qty 2

## 2020-12-10 NOTE — Progress Notes (Signed)
Patient arrived to room 207 from ED. Received message from ED RN, patient troponin resulted as a critical lab value. MD alerted. Per MD patient will need to transfer to a higher level of care. Transfer orders placed for progressive care bed. Patient denies any chest pain, SOB, and or chest pressure. Patient placed on cardiac monitoring. Patient's husband has been notified and patient has been updated with plan of care.   Madie Reno, RN

## 2020-12-10 NOTE — ED Provider Notes (Signed)
[facility] Emergency Department Provider Note   ____________________________________________   Event Date/Time   First MD Initiated Contact with Patient 12/10/20 1416     (approximate)  I have reviewed the triage vital signs and the nursing notes.   HISTORY  Chief Complaint Diarrhea and Emesis    HPI Erica Brock is a 70 y.o. female with past medical history of hypertension, hyperlipidemia, diabetes, and DVT/PE who presents to the ED for vomiting and diarrhea.  Patient reports that since waking up this morning she has had multiple episodes of vomiting and liquid stool.  She denies any associated abdominal pain, states her emesis was dark but that she had had some coffee before she vomited.  She denies any blood in her stool and has not had any dark tarry stools.  She no longer takes Eliquis for her DVT/PE as this had been provoked by lower extremity surgery.  She does state that she was feeling very lightheaded and like she was going to pass out by the time the ambulance arrived.  She states she does not remember much of the ambulance ride because she was feeling "woozy."  She reports having some mild pain in her chest that has now resolved, denies any difficulty breathing.  She has not had any fevers or cough.        Past Medical History:  Diagnosis Date  . Anxiety   . Arthritis   . Diabetes mellitus without complication (HCC)   . Diffuse cystic mastopathy 2012  . GERD (gastroesophageal reflux disease)   . Heart murmur    ETT myoview 12/09 (Dr. Juel Burrow) no evidence of ischemia, good exercise tolerance - 9 min. echo (11/10): EF 60-65%, nml RV, PASP 25, nml valves  . Hepatitis    as a child   . HLD (hyperlipidemia)   . HTN (hypertension)    for < 10 years  . Obesity   . OSA (obstructive sleep apnea)    mild; uses CPAP  . Rheumatic fever    as a child    Patient Active Problem List   Diagnosis Date Noted  . Pulmonary embolism (HCC) 07/22/2017   . DVT (deep venous thrombosis) (HCC) 07/22/2017  . Controlled type 2 diabetes mellitus without complication, without long-term current use of insulin (HCC) 08/08/2016  . Headache 07/22/2013  . Fibrocystic breast 11/09/2012  . Obese 09/02/2011  . Insomnia 09/02/2011  . OSA on CPAP 09/02/2011  . OTHER MALAISE AND FATIGUE 09/11/2010  . Hyperlipidemia 05/22/2009  . Essential hypertension 05/22/2009  . CHEST PAIN-UNSPECIFIED 05/22/2009  . RHEUMATIC FEVER, HX OF 05/22/2009    Past Surgical History:  Procedure Laterality Date  . arthroscopic surgery     L elbow   . BREAST BIOPSY Left    needle bx-neg  . carpael tunnel release     R hand  . COLONOSCOPY  2008   Dr. Sharen Hint  . FOOT SURGERY Left   . reconstruction foot and bunion surgery - both feet    . TONSILLECTOMY    . TOTAL ABDOMINAL HYSTERECTOMY  1994    Prior to Admission medications   Medication Sig Start Date End Date Taking? Authorizing Provider  apixaban (ELIQUIS) 2.5 MG TABS tablet Take 1 tablet (2.5 mg total) by mouth 2 (two) times daily. PRN for travel 11/09/19   Antonieta Iba, MD  cloNIDine (CATAPRES) 0.1 MG tablet Take 1 tablet (0.1 mg total) by mouth 2 (two) times daily. 04/23/20   Antonieta Iba, MD  ezetimibe (ZETIA) 10 MG tablet TAKE 1 TABLET DAILY 07/12/20   Antonieta Iba, MD  furosemide (LASIX) 20 MG tablet Take 1 tablet (20 mg total) by mouth daily as needed. 08/08/14   Antonieta Iba, MD  lisinopril (ZESTRIL) 40 MG tablet Take 1 tablet (40 mg total) by mouth daily. 09/25/20 03/24/21  Alver Sorrow, NP  metFORMIN (GLUCOPHAGE-XR) 500 MG 24 hr tablet Take 500 mg by mouth at bedtime. 05/22/15   [provider]  montelukast (SINGULAIR) 10 MG tablet Take 10 mg by mouth daily as needed.    [provider]  pantoprazole (PROTONIX) 40 MG tablet Take 40 mg by mouth daily.      [provider]  rosuvastatin (CRESTOR) 40 MG tablet Take 1 tablet (40 mg total) by mouth daily. 11/09/18    Antonieta Iba, MD    Allergies Atorvastatin and Prednisone  Family History  Problem Relation Age of Onset  . Diabetes Father   . Hypertension Father   . Other Father        PPM  . Multiple sclerosis Mother   . Hyperlipidemia Mother   . Heart attack Sister        LATE 30'S, 40  . Aneurysm Brother        brain  . Hypertension Sister   . Hyperlipidemia Sister   . Breast cancer Neg Hx     Social History Social History   Tobacco Use  . Smoking status: Never Smoker  . Smokeless tobacco: Never Used  . Tobacco comment: tobacco use - no  Vaping Use  . Vaping Use: Never used  Substance Use Topics  . Alcohol use: Yes    Comment: social   . Drug use: No    Review of Systems  Constitutional: No fever/chills Eyes: No visual changes. ENT: No sore throat. Cardiovascular: Positive for lightheadedness and chest pain. Respiratory: Denies shortness of breath. Gastrointestinal: No abdominal pain.  Positive for nausea, vomiting, and diarrhea.  No constipation. Genitourinary: Negative for dysuria. Musculoskeletal: Negative for back pain. Skin: Negative for rash. Neurological: Negative for headaches, focal weakness or numbness.  ____________________________________________   PHYSICAL EXAM:  VITAL SIGNS: ED Triage Vitals  Enc Vitals Group     BP 12/10/20 1157 (!) 127/93     Pulse Rate 12/10/20 1157 (!) 105     Resp 12/10/20 1157 18     Temp 12/10/20 1242 98 F (36.7 C)     Temp Source 12/10/20 1242 Oral     SpO2 12/10/20 1157 100 %     Weight 12/10/20 1241 194 lb (88 kg)     Height 12/10/20 1241 5\' 4"  (1.626 m)     Head Circumference --      Peak Flow --      Pain Score 12/10/20 1242 0     Pain Loc --      Pain Edu? --      Excl. in GC? --     Constitutional: Alert and oriented. Eyes: Conjunctivae are normal. Head: Atraumatic. Nose: No congestion/rhinnorhea. Mouth/Throat: Mucous membranes are moist. Neck: Normal ROM Cardiovascular: Normal rate, regular  rhythm. Grossly normal heart sounds. Respiratory: Normal respiratory effort.  No retractions. Lungs CTAB. Gastrointestinal: Soft and nontender. No distention. Genitourinary: deferred Musculoskeletal: No lower extremity tenderness nor edema. Neurologic:  Normal speech and language. No gross focal neurologic deficits are appreciated. Skin:  Skin is warm, dry and intact. No rash noted. Psychiatric: Mood and affect are normal. Speech and behavior are  normal.  ____________________________________________   LABS (all labs ordered are listed, but only abnormal results are displayed)  Labs Reviewed  COMPREHENSIVE METABOLIC PANEL - Abnormal; Notable for the following components:      Result Value   CO2 21 (*)    Glucose, Bld 147 (*)    Creatinine, Ser 1.04 (*)    Total Protein 8.8 (*)    Albumin 5.4 (*)    GFR, Estimated 57 (*)    Anion gap 16 (*)    All other components within normal limits  CBC - Abnormal; Notable for the following components:   WBC 17.8 (*)    Platelets 403 (*)    All other components within normal limits  TROPONIN I (HIGH SENSITIVITY) - Abnormal; Notable for the following components:   Troponin I (High Sensitivity) 91 (*)    All other components within normal limits  SARS CORONAVIRUS 2 (TAT 6-24 HRS)  LIPASE, BLOOD  URINALYSIS, COMPLETE (UACMP) WITH MICROSCOPIC   ____________________________________________  EKG  ED ECG REPORT I, Chesley Noon, the attending physician, personally viewed and interpreted this ECG.   Date: 12/10/2020  EKG Time: 14:46  Rate: 84  Rhythm: normal sinus rhythm  Axis: LAD  Intervals:left anterior fascicular block  ST&T Change: None   PROCEDURES  Procedure(s) performed (including Critical Care):  Procedures   ____________________________________________   INITIAL IMPRESSION / ASSESSMENT AND PLAN / ED COURSE       70 year old female with past medical history of hypertension, hyperlipidemia, diabetes, and DVT/PE who  presents to the ED complaining of nausea, vomiting, and diarrhea since waking up this morning associated with episode of lightheadedness and chest pain.  Chest pain has since resolved and EKG shows no evidence of arrhythmia or ischemia.  Episode sounds most consistent with vasovagal near syncope given association with her vomiting and diarrhea.  Initial labs are reassuring, we will add on troponin, hydrate with IV fluids, treat with Zofran and loperamide.  She has no abdominal tenderness and I suspect symptoms are due to a gastroenteritis.  She has no focal neurologic deficits to suggest stroke.  Patient feeling better following IV fluids, Zofran, and loperamide.  Troponin noted to be mildly elevated at 91, with previous troponins being undetectable.  Given her reported near syncope and chest pain, plan for admission for observation.      ____________________________________________   FINAL CLINICAL IMPRESSION(S) / ED DIAGNOSES  Final diagnoses:  Chest pain, unspecified type  Near syncope  Gastroenteritis     ED Discharge Orders    None       Note:  This document was prepared using Dragon voice recognition software and may include unintentional dictation errors.   Chesley Noon, MD 12/10/20 [phone]

## 2020-12-10 NOTE — H&P (Signed)
[facility] History and Physical  ANNEKE CUNDY XHB:[id number] DOB: 09/02/1950 DOA: 12/10/2020 PCP: Leim Fabry, MD  Admitted from: Home Chief Complaint: Nausea, vomiting, diarrhea  History of Present Illness: Erica Brock is a 70 y.o. female with PMH significant for HTN, HLD, DM2, DVT/PE in the past, currently not on anticoagulation, anxiety, arthritis, obesity, sleep apnea, rheumatic fever as a child. Patient woke up in her usual state of health this morning.  She took a multivitamin pill today.  Shortly after that she started having intense nausea followed by profuse vomiting as well as diarrhea.  After several episodes of those, patient looked lethargic, cold clammy.  Her husband called EMS and hence patient was brought to the ED.  She does not remember much of the ambulance ride.  Still feeling woozy.  In the ED, patient was afebrile, heart rate 104, blood pressure 127/93, breathing on room air Labs with WBC count elevated to 17.8, normal hemoglobin and platelets, sodium 136, potassium 3.6, serum bicarb 21, glucose 147, BUN/creatinine 15/1.04, first troponin was elevated to 91. EKG showed normal sinus rhythm at 84 bpm, no ST-T wave changes and QTC 465 ms. Hospitalist service was consulted for overnight observation  At the time of my evaluation, patient was accompanied by her husband.  She was feeling tired.  Propped up in the bed.  Mild nausea present. Denies any fever, abdominal pain. Patient states that she had similar but less severe episode of GI intolerance when she tried vitamin pills in the past as well.   Review of Systems:  All systems were reviewed and were negative unless otherwise mentioned in the HPI   Past medical history: Past Medical History:  Diagnosis Date  . Anxiety   . Arthritis   . Diabetes mellitus without complication (HCC)   . Diffuse cystic mastopathy 2012  . GERD (gastroesophageal reflux disease)   . Heart murmur    ETT myoview 12/09 (Dr.  Juel Burrow) no evidence of ischemia, good exercise tolerance - 9 min. echo (11/10): EF 60-65%, nml RV, PASP 25, nml valves  . Hepatitis    as a child   . HLD (hyperlipidemia)   . HTN (hypertension)    for < 10 years  . Obesity   . OSA (obstructive sleep apnea)    mild; uses CPAP  . Rheumatic fever    as a child    Past surgical history: Past Surgical History:  Procedure Laterality Date  . arthroscopic surgery     L elbow   . BREAST BIOPSY Left    needle bx-neg  . carpael tunnel release     R hand  . COLONOSCOPY  2008   Dr. Sharen Hint  . FOOT SURGERY Left   . reconstruction foot and bunion surgery - both feet    . TONSILLECTOMY    . TOTAL ABDOMINAL HYSTERECTOMY  1994    Social History:  reports that she has never smoked. She has never used smokeless tobacco. She reports current alcohol use. She reports that she does not use drugs.  Allergies:  Allergies  Allergen Reactions  . Atorvastatin     Muscle aches   . Prednisone     Family history:  Family History  Problem Relation Age of Onset  . Diabetes Father   . Hypertension Father   . Other Father        PPM  . Multiple sclerosis Mother   . Hyperlipidemia Mother   . Heart attack Sister  LATE 30'S, 40  . Aneurysm Brother        brain  . Hypertension Sister   . Hyperlipidemia Sister   . Breast cancer Neg Hx      Home Meds: Prior to Admission medications   Medication Sig Start Date End Date Taking? Authorizing Provider  apixaban (ELIQUIS) 2.5 MG TABS tablet Take 1 tablet (2.5 mg total) by mouth 2 (two) times daily. PRN for travel 11/09/19   Antonieta Iba, MD  cloNIDine (CATAPRES) 0.1 MG tablet Take 1 tablet (0.1 mg total) by mouth 2 (two) times daily. 04/23/20   Antonieta Iba, MD  ezetimibe (ZETIA) 10 MG tablet TAKE 1 TABLET DAILY 07/12/20   Antonieta Iba, MD  furosemide (LASIX) 20 MG tablet Take 1 tablet (20 mg total) by mouth daily as needed. 08/08/14   Antonieta Iba, MD  lisinopril (ZESTRIL) 40  MG tablet Take 1 tablet (40 mg total) by mouth daily. 09/25/20 03/24/21  Alver Sorrow, NP  metFORMIN (GLUCOPHAGE-XR) 500 MG 24 hr tablet Take 500 mg by mouth at bedtime. 05/22/15   [provider]  montelukast (SINGULAIR) 10 MG tablet Take 10 mg by mouth daily as needed.    [provider]  pantoprazole (PROTONIX) 40 MG tablet Take 40 mg by mouth daily.      [provider]  rosuvastatin (CRESTOR) 40 MG tablet Take 1 tablet (40 mg total) by mouth daily. 11/09/18   Antonieta Iba, MD    Physical Exam: Vitals:   12/10/20 1157 12/10/20 1241 12/10/20 1242 12/10/20 1509  BP: (!) 127/93   (!) 139/93  Pulse: (!) 105   90  Resp: 18   18  Temp:   98 F (36.7 C)   TempSrc:   Oral   SpO2: 100%   99%  Weight:  88 kg    Height:  5\' 4"  (1.626 m)     Wt Readings from Last 3 Encounters:  12/10/20 88 kg  09/25/20 90.3 kg  09/04/20 89.4 kg   Body mass index is 33.3 kg/m.  General exam: Pleasant, elderly Caucasian female.  Not in distress Skin: No rashes, lesions or ulcers. HEENT: Atraumatic, normocephalic, no obvious bleeding Lungs: Clear to auscultation bilaterally CVS: Regular rate and rhythm, no murmur GI/Abd soft, nontender, nondistended, bowel sound present CNS: Alert, awake, oriented x3 Psychiatry: Mood appropriate Extremities: No pedal edema, no calf tenderness     Consult Orders  (From admission, onward)         Start     Ordered   12/10/20 1556  Consult to hospitalist  Once       Provider:  (Not yet assigned)  Question Answer Comment  Place call to: [phone]   Reason for Consult Admit      12/10/20 1555          Labs on Admission:   CBC: Recent Labs  Lab 12/10/20 1239  WBC 17.8*  HGB 14.0  HCT 40.8  MCV 89.1  PLT 403*    Basic Metabolic Panel: Recent Labs  Lab 12/10/20 1239  NA 136  K 3.6  CL 99  CO2 21*  GLUCOSE 147*  BUN 15  CREATININE 1.04*  CALCIUM 10.2    Liver Function Tests: Recent Labs  Lab  12/10/20 1239  AST 38  ALT 30  ALKPHOS 56  BILITOT 0.7  PROT 8.8*  ALBUMIN 5.4*   Recent Labs  Lab 12/10/20 1239  LIPASE 47   No results for input(s):  AMMONIA in the last 168 hours.  Cardiac Enzymes: No results for input(s): CKTOTAL, CKMB, CKMBINDEX, TROPONINI in the last 168 hours.  BNP (last 3 results) No results for input(s): BNP in the last 8760 hours.  ProBNP (last 3 results) No results for input(s): PROBNP in the last 8760 hours.  CBG: No results for input(s): GLUCAP in the last 168 hours.  Lipase     Component Value Date/Time   LIPASE 47 12/10/2020 1239     Urinalysis    Component Value Date/Time   COLORURINE YELLOW (A) 07/05/2015 2254   APPEARANCEUR CLOUDY (A) 07/05/2015 2254   APPEARANCEUR HAZY 12/12/2013 1041   LABSPEC 1.014 07/05/2015 2254   LABSPEC 1.015 12/12/2013 1041   PHURINE 5.0 07/05/2015 2254   GLUCOSEU NEGATIVE 07/05/2015 2254   GLUCOSEU NEGATIVE 12/12/2013 1041   HGBUR 3+ (A) 07/05/2015 2254   BILIRUBINUR NEGATIVE 07/05/2015 2254   BILIRUBINUR NEGATIVE 12/12/2013 1041   KETONESUR TRACE (A) 07/05/2015 2254   PROTEINUR >500 (A) 07/05/2015 2254   NITRITE POSITIVE (A) 07/05/2015 2254   LEUKOCYTESUR 3+ (A) 07/05/2015 2254   LEUKOCYTESUR 3+ 12/12/2013 1041     Drugs of Abuse  No results found for: LABOPIA, COCAINSCRNUR, LABBENZ, AMPHETMU, THCU, LABBARB    Radiological Exams on Admission: No results found.   ------------------------------------------------------------------------------------------------------ Assessment/Plan: Active Problems:   Intractable nausea and vomiting  Intractable nausea vomiting diarrhea -Sudden onset this morning, multiple episodes throughout the day.  Feels dehydrated. -No clear etiology.  Probably not an infection since it was sudden onset without fever or abdominal pain. -Suspect GI intolerance to multivitamin as she had similar symptoms in the past as well. -Antiemetics, IV  hydration.  Dehydration -Due to nausea vomiting.  Creatinine higher than normal. -Normal saline at 100 mill per hour overnight. Recent Labs    09/04/20 1514 12/10/20 1239  BUN 16 15  CREATININE 0.61 1.04*   Recent Labs  Lab 12/10/20 1239  K 3.6   Acute metabolic encephalopathy Near syncope -Patient had lightheadedness, confusion after several episodes of vomiting probably related to metabolic abnormality. -Mental status is clearing up now.  Elevated troponin -No history of coronary artery disease.  Echocardiogram a month ago was unremarkable. -First troponin elevated to 91.  Probably related to intense nausea vomiting and transient hypotension. -EKG without ST-T wave changes. -Trend troponin. Recent Labs    12/10/20 1239  TROPONINIHS 91*   Essential hypertension   -Home meds include clonidine 0.1 mg twice daily, lisinopril 40 mg daily Lasix as needed -Continue clonidine.  Keep lisinopril on hold. -Continue to monitor blood pressure.  Type 2 diabetes mellitus -A1c pending -Home meds include metformin 500 mg at bedtime -Start on sliding scale insulin with Accu-Cheks. No results found for: HGBA1C No results for input(s): GLUCAP in the last 168 hours.  Hyperlipidemia -Reported allergy to statin.  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  Completed course of anticoagulation.    Mobility: Encourage ambulation Code Status:   Code Status: Not on file  DVT prophylaxis:  Lovenox subcu Antimicrobials:  None Fluid: Normal saline  Diet: Clear liquid diet.  Advance as tolerated  Consultants: None Family Communication:  Husband at bedside    Dispo: The patient is from: Home              Anticipated d/c is to: Home, likely tomorrow if symptoms improved              Anticipated d/c date is: 1 day  ------------------------------------------------------------------------------------- Severity of Illness:  The appropriate patient status for this patient is  OBSERVATION. Observation status is judged to be reasonable and necessary in order to provide the required intensity of service to ensure the patient's safety. The patient's presenting symptoms, physical exam findings, and initial radiographic and laboratory data in the context of their medical condition is felt to place them at decreased risk for further clinical deterioration. Furthermore, it is anticipated that the patient will be medically stable for discharge from the hospital within 2 midnights of admission. The following factors support the patient status of observation.   " The patient's presenting symptoms include nausea, vomiting, diarrhea. " The physical exam findings include lethargy. " The initial radiographic and laboratory data are dehydration.  Signed, Lorin Glass, MD [facility] 12/10/2020

## 2020-12-10 NOTE — ED Notes (Signed)
ED Provider at bedside. 

## 2020-12-10 NOTE — ED Notes (Signed)
Sent message to floor nurse  

## 2020-12-10 NOTE — ED Notes (Signed)
Transport requested

## 2020-12-10 NOTE — Progress Notes (Signed)
2nd troponin came elevated at 260. No anginal symptoms.  D/w cardiologist Dr. Tenny Craw.  Since, patient has no anginal symptoms, she wouldn't need heparin drip at this time. We'll upgrade her to a progressive bed. Repeat troponin cycle.  Communicated to RN and patient's spouse.

## 2020-12-10 NOTE — ED Notes (Signed)
Called to add Trop

## 2020-12-10 NOTE — ED Notes (Signed)
Message sent to floor nurse  

## 2020-12-10 NOTE — ED Notes (Signed)
Critical Trop   Trop 260   MD. Dahl messaged

## 2020-12-10 NOTE — Progress Notes (Signed)
Patient in room via bed. Alert and VSS. Pleasant demeanor. Ambulated to bathroom x 1 assist. Denies nausea or pain at this time. No distress at this time. NS infusing to left hand IV at 100cc/hr. No respiratory issues noted.

## 2020-12-10 NOTE — ED Notes (Signed)
Hospitalist at bedside 

## 2020-12-10 NOTE — Progress Notes (Signed)
Recent troponin is elevated to 329. Face to face result given to Manuela Schwartz, NP. Still awaiting transfer to PCU. Patient is stable at this time with no chest pain. VSS

## 2020-12-10 NOTE — ED Notes (Signed)
Rainbow, lactic, and 1 set of Blood Cultures sent to lab.

## 2020-12-10 NOTE — ED Notes (Signed)
Attempted to call the patient's husband to sit with her, but he did not answer the phone.

## 2020-12-10 NOTE — ED Triage Notes (Signed)
Pt comes with c/o N/V/D via EMs that started last night. EMS reports dark brown vomit, liquid diarrhea. Pt given 4mg  IM zofran. VSS pt also given 1 spray of nitro and 324 aspirin after c/o substernal CP.

## 2020-12-11 ENCOUNTER — Observation Stay (HOSPITAL_BASED_OUTPATIENT_CLINIC_OR_DEPARTMENT_OTHER): Payer: Medicare HMO

## 2020-12-11 ENCOUNTER — Inpatient Hospital Stay (HOSPITAL_COMMUNITY)
Admission: AD | Admit: 2020-12-11 | Discharge: 2020-12-18 | DRG: 236 | Disposition: A | Discharge status: Home / Self Care | Payer: Medicare HMO | Source: Other Acute Inpatient Hospital | Attending: Thoracic Surgery (Cardiothoracic Vascular Surgery) | Admitting: Thoracic Surgery (Cardiothoracic Vascular Surgery)

## 2020-12-11 ENCOUNTER — Encounter
Admission: EM | Disposition: A | Discharge status: Home / Self Care | Payer: Self-pay | Source: Home / Self Care | Attending: Emergency Medicine

## 2020-12-11 DIAGNOSIS — I700 Atherosclerosis of aorta: Secondary | ICD-10-CM | POA: Diagnosis present

## 2020-12-11 DIAGNOSIS — E8770 Fluid overload, unspecified: Secondary | ICD-10-CM | POA: Diagnosis present

## 2020-12-11 DIAGNOSIS — F419 Anxiety disorder, unspecified: Secondary | ICD-10-CM | POA: Diagnosis present

## 2020-12-11 DIAGNOSIS — Z86711 Personal history of pulmonary embolism: Secondary | ICD-10-CM

## 2020-12-11 DIAGNOSIS — G4733 Obstructive sleep apnea (adult) (pediatric): Secondary | ICD-10-CM | POA: Diagnosis present

## 2020-12-11 DIAGNOSIS — J939 Pneumothorax, unspecified: Secondary | ICD-10-CM

## 2020-12-11 DIAGNOSIS — Z8249 Family history of ischemic heart disease and other diseases of the circulatory system: Secondary | ICD-10-CM

## 2020-12-11 DIAGNOSIS — I340 Nonrheumatic mitral (valve) insufficiency: Secondary | ICD-10-CM | POA: Diagnosis present

## 2020-12-11 DIAGNOSIS — Z83438 Family history of other disorder of lipoprotein metabolism and other lipidemia: Secondary | ICD-10-CM

## 2020-12-11 DIAGNOSIS — I252 Old myocardial infarction: Secondary | ICD-10-CM

## 2020-12-11 DIAGNOSIS — D72829 Elevated white blood cell count, unspecified: Secondary | ICD-10-CM | POA: Diagnosis not present

## 2020-12-11 DIAGNOSIS — K219 Gastro-esophageal reflux disease without esophagitis: Secondary | ICD-10-CM | POA: Diagnosis present

## 2020-12-11 DIAGNOSIS — Z86718 Personal history of other venous thrombosis and embolism: Secondary | ICD-10-CM | POA: Diagnosis not present

## 2020-12-11 DIAGNOSIS — E119 Type 2 diabetes mellitus without complications: Secondary | ICD-10-CM | POA: Diagnosis present

## 2020-12-11 DIAGNOSIS — R6510 Systemic inflammatory response syndrome (SIRS) of non-infectious origin without acute organ dysfunction: Secondary | ICD-10-CM | POA: Diagnosis not present

## 2020-12-11 DIAGNOSIS — R112 Nausea with vomiting, unspecified: Secondary | ICD-10-CM | POA: Diagnosis present

## 2020-12-11 DIAGNOSIS — I10 Essential (primary) hypertension: Secondary | ICD-10-CM | POA: Diagnosis present

## 2020-12-11 DIAGNOSIS — Z7984 Long term (current) use of oral hypoglycemic drugs: Secondary | ICD-10-CM

## 2020-12-11 DIAGNOSIS — M069 Rheumatoid arthritis, unspecified: Secondary | ICD-10-CM | POA: Diagnosis present

## 2020-12-11 DIAGNOSIS — R9439 Abnormal result of other cardiovascular function study: Secondary | ICD-10-CM

## 2020-12-11 DIAGNOSIS — E669 Obesity, unspecified: Secondary | ICD-10-CM | POA: Diagnosis present

## 2020-12-11 DIAGNOSIS — Z888 Allergy status to other drugs, medicaments and biological substances status: Secondary | ICD-10-CM

## 2020-12-11 DIAGNOSIS — Q250 Patent ductus arteriosus: Secondary | ICD-10-CM

## 2020-12-11 DIAGNOSIS — I214 Non-ST elevation (NSTEMI) myocardial infarction: Secondary | ICD-10-CM

## 2020-12-11 DIAGNOSIS — E785 Hyperlipidemia, unspecified: Secondary | ICD-10-CM | POA: Diagnosis present

## 2020-12-11 DIAGNOSIS — I2510 Atherosclerotic heart disease of native coronary artery without angina pectoris: Secondary | ICD-10-CM | POA: Diagnosis not present

## 2020-12-11 DIAGNOSIS — I471 Supraventricular tachycardia: Secondary | ICD-10-CM | POA: Diagnosis not present

## 2020-12-11 DIAGNOSIS — D62 Acute posthemorrhagic anemia: Secondary | ICD-10-CM | POA: Diagnosis not present

## 2020-12-11 DIAGNOSIS — I25110 Atherosclerotic heart disease of native coronary artery with unstable angina pectoris: Secondary | ICD-10-CM | POA: Diagnosis present

## 2020-12-11 DIAGNOSIS — Z951 Presence of aortocoronary bypass graft: Secondary | ICD-10-CM

## 2020-12-11 DIAGNOSIS — J90 Pleural effusion, not elsewhere classified: Secondary | ICD-10-CM | POA: Diagnosis not present

## 2020-12-11 DIAGNOSIS — E1169 Type 2 diabetes mellitus with other specified complication: Secondary | ICD-10-CM | POA: Diagnosis present

## 2020-12-11 DIAGNOSIS — Z833 Family history of diabetes mellitus: Secondary | ICD-10-CM

## 2020-12-11 DIAGNOSIS — Z01811 Encounter for preprocedural respiratory examination: Secondary | ICD-10-CM

## 2020-12-11 DIAGNOSIS — Z6833 Body mass index (BMI) 33.0-33.9, adult: Secondary | ICD-10-CM | POA: Diagnosis not present

## 2020-12-11 DIAGNOSIS — I25119 Atherosclerotic heart disease of native coronary artery with unspecified angina pectoris: Secondary | ICD-10-CM | POA: Diagnosis not present

## 2020-12-11 DIAGNOSIS — Z79899 Other long term (current) drug therapy: Secondary | ICD-10-CM

## 2020-12-11 DIAGNOSIS — J9811 Atelectasis: Secondary | ICD-10-CM | POA: Diagnosis not present

## 2020-12-11 DIAGNOSIS — Z01810 Encounter for preprocedural cardiovascular examination: Secondary | ICD-10-CM | POA: Diagnosis not present

## 2020-12-11 DIAGNOSIS — R197 Diarrhea, unspecified: Secondary | ICD-10-CM | POA: Diagnosis present

## 2020-12-11 HISTORY — PX: LEFT HEART CATH AND CORONARY ANGIOGRAPHY: CATH118249

## 2020-12-11 LAB — NM MYOCAR MULTI W/SPECT W/WALL MOTION / EF
Estimated workload: 1 METS
Exercise duration (min): 0 min
Exercise duration (sec): 0 s
LV dias vol: 128 mL (ref 46–106)
LV sys vol: 62 mL
MPHR: 149 {beats}/min
Peak HR: 107 {beats}/min
Percent HR: 71 %
Rest HR: 74 {beats}/min
TID: 1.12

## 2020-12-11 LAB — GLUCOSE, CAPILLARY
Glucose-Capillary: 130 mg/dL — ABNORMAL HIGH (ref 70–99)
Glucose-Capillary: 77 mg/dL (ref 70–99)
Glucose-Capillary: 78 mg/dL (ref 70–99)
Glucose-Capillary: 86 mg/dL (ref 70–99)

## 2020-12-11 LAB — MAGNESIUM: Magnesium: 2 mg/dL (ref 1.7–2.4)

## 2020-12-11 LAB — TROPONIN I (HIGH SENSITIVITY): Troponin I (High Sensitivity): 253 ng/L (ref <18)

## 2020-12-11 LAB — BASIC METABOLIC PANEL
Anion gap: 8 (ref 5–15)
BUN: 11 mg/dL (ref 8–23)
CO2: 23 mmol/L (ref 22–32)
Calcium: 8.4 mg/dL — ABNORMAL LOW (ref 8.9–10.3)
Chloride: 104 mmol/L (ref 98–111)
Creatinine, Ser: 0.73 mg/dL (ref 0.44–1.00)
GFR, Estimated: >60 mL/min (ref >60)
Glucose, Bld: 86 mg/dL (ref 70–99)
Potassium: 4.2 mmol/L (ref 3.5–5.1)
Sodium: 135 mmol/L (ref 135–145)

## 2020-12-11 LAB — CBC
HCT: 31.2 % — ABNORMAL LOW (ref 36.0–46.0)
Hemoglobin: 10.8 g/dL — ABNORMAL LOW (ref 12.0–15.0)
MCH: 30.8 pg (ref 26.0–34.0)
MCHC: 34.6 g/dL (ref 30.0–36.0)
MCV: 88.9 fL (ref 80.0–100.0)
Platelets: 294 10*3/uL (ref 150–400)
RBC: 3.51 MIL/uL — ABNORMAL LOW (ref 3.87–5.11)
RDW: 12.9 % (ref 11.5–15.5)
WBC: 8.3 10*3/uL (ref 4.0–10.5)
nRBC: 0 % (ref 0.0–0.2)

## 2020-12-11 LAB — SARS CORONAVIRUS 2 (TAT 6-24 HRS): SARS Coronavirus 2: NEGATIVE

## 2020-12-11 LAB — PHOSPHORUS: Phosphorus: 3.7 mg/dL (ref 2.5–4.6)

## 2020-12-11 SURGERY — LEFT HEART CATH AND CORONARY ANGIOGRAPHY
Anesthesia: Moderate Sedation

## 2020-12-11 MED ORDER — SODIUM CHLORIDE 0.9 % IV SOLN: INTRAVENOUS | Status: DC

## 2020-12-11 MED ORDER — SODIUM CHLORIDE 0.9% FLUSH: 3.0000 mL | INTRAVENOUS | Status: DC | PRN

## 2020-12-11 MED ORDER — FENTANYL CITRATE (PF) 100 MCG/2ML IJ SOLN
INTRAMUSCULAR | Status: AC
  Filled 2020-12-11: qty 2

## 2020-12-11 MED ORDER — PANTOPRAZOLE SODIUM 20 MG PO TBEC: 40.0000 mg | DELAYED_RELEASE_TABLET | Freq: Every day | ORAL | Status: DC

## 2020-12-11 MED ORDER — SODIUM CHLORIDE 0.9% FLUSH: 3.0000 mL | Freq: Two times a day (BID) | INTRAVENOUS | Status: DC

## 2020-12-11 MED ORDER — MIDAZOLAM HCL 2 MG/2ML IJ SOLN
INTRAMUSCULAR | Status: DC | PRN
  Administered 2020-12-11: 1 mg via INTRAVENOUS

## 2020-12-11 MED ORDER — MIDAZOLAM HCL 2 MG/2ML IJ SOLN
INTRAMUSCULAR | Status: AC
  Filled 2020-12-11: qty 2

## 2020-12-11 MED ORDER — ASPIRIN EC 81 MG PO TBEC: 81.0000 mg | DELAYED_RELEASE_TABLET | Freq: Every day | ORAL | Status: DC

## 2020-12-11 MED ORDER — VERAPAMIL HCL 2.5 MG/ML IV SOLN
INTRAVENOUS | Status: DC | PRN
  Administered 2020-12-11: 2.5 mg via INTRA_ARTERIAL

## 2020-12-11 MED ORDER — HEPARIN SODIUM (PORCINE) 1000 UNIT/ML IJ SOLN
INTRAMUSCULAR | Status: DC | PRN
  Administered 2020-12-11: 4500 [IU] via INTRAVENOUS

## 2020-12-11 MED ORDER — TECHNETIUM TC 99M TETROFOSMIN IV KIT
11.3000 | PACK | Freq: Once | INTRAVENOUS | Status: AC | PRN
  Administered 2020-12-11: 11.3 via INTRAVENOUS

## 2020-12-11 MED ORDER — SODIUM CHLORIDE 0.9 % IV SOLN: 250.0000 mL | INTRAVENOUS | Status: DC | PRN

## 2020-12-11 MED ORDER — HEPARIN (PORCINE) 25000 UT/250ML-% IV SOLN
1100.0000 [IU]/h | INTRAVENOUS | Status: DC
  Administered 2020-12-12: 1100 [IU]/h via INTRAVENOUS
  Filled 2020-12-11: qty 250

## 2020-12-11 MED ORDER — HEPARIN (PORCINE) 25000 UT/250ML-% IV SOLN
900.0000 [IU]/h | INTRAVENOUS | Status: DC
  Filled 2020-12-11: qty 250

## 2020-12-11 MED ORDER — ROSUVASTATIN CALCIUM 10 MG PO TABS
40.0000 mg | ORAL_TABLET | Freq: Every day | ORAL | Status: DC
  Filled 2020-12-11 (×3): qty 1

## 2020-12-11 MED ORDER — SODIUM CHLORIDE 0.9 % IV SOLN
4.0000 mg | Freq: Four times a day (QID) | INTRAVENOUS | Status: DC | PRN
  Filled 2020-12-11: qty 2

## 2020-12-11 MED ORDER — CLONIDINE HCL 0.1 MG PO TABS: 0.1000 mg | ORAL_TABLET | Freq: Two times a day (BID) | ORAL | Status: DC

## 2020-12-11 MED ORDER — LABETALOL HCL 5 MG/ML IV SOLN: 10.0000 mg | INTRAVENOUS | Status: DC | PRN

## 2020-12-11 MED ORDER — EZETIMIBE 10 MG PO TABS: 10.0000 mg | ORAL_TABLET | Freq: Every day | ORAL | Status: DC

## 2020-12-11 MED ORDER — HEPARIN (PORCINE) IN NACL 1000-0.9 UT/500ML-% IV SOLN
INTRAVENOUS | Status: AC
  Filled 2020-12-11: qty 1000

## 2020-12-11 MED ORDER — ALBUTEROL SULFATE (2.5 MG/3ML) 0.083% IN NEBU: 2.5000 mg | INHALATION_SOLUTION | Freq: Four times a day (QID) | RESPIRATORY_TRACT | Status: DC | PRN

## 2020-12-11 MED ORDER — INSULIN ASPART 100 UNIT/ML IJ SOLN
0.0000 [IU] | Freq: Three times a day (TID) | INTRAMUSCULAR | Status: DC
  Administered 2020-12-12: 2 [IU] via SUBCUTANEOUS
  Administered 2020-12-12: 3 [IU] via SUBCUTANEOUS

## 2020-12-11 MED ORDER — TECHNETIUM TC 99M TETROFOSMIN IV KIT
31.7600 | PACK | Freq: Once | INTRAVENOUS | Status: AC | PRN
  Administered 2020-12-11: 31.76 via INTRAVENOUS

## 2020-12-11 MED ORDER — ACETAMINOPHEN 325 MG PO TABS: 650.0000 mg | ORAL_TABLET | ORAL | Status: DC | PRN

## 2020-12-11 MED ORDER — LISINOPRIL 20 MG PO TABS: 40.0000 mg | ORAL_TABLET | Freq: Every day | ORAL | Status: DC

## 2020-12-11 MED ORDER — HEPARIN SODIUM (PORCINE) 1000 UNIT/ML IJ SOLN
INTRAMUSCULAR | Status: AC
  Filled 2020-12-11: qty 1

## 2020-12-11 MED ORDER — IOHEXOL 300 MG/ML  SOLN
INTRAMUSCULAR | Status: DC | PRN
  Administered 2020-12-11: 22 mL

## 2020-12-11 MED ORDER — ASPIRIN 81 MG PO CHEW: 81.0000 mg | CHEWABLE_TABLET | ORAL | Status: DC

## 2020-12-11 MED ORDER — VERAPAMIL HCL 2.5 MG/ML IV SOLN
INTRAVENOUS | Status: AC
  Filled 2020-12-11: qty 2

## 2020-12-11 MED ORDER — HEPARIN (PORCINE) IN NACL 2000-0.9 UNIT/L-% IV SOLN
INTRAVENOUS | Status: DC | PRN
  Administered 2020-12-11: 1000 mL

## 2020-12-11 MED ORDER — LISINOPRIL 2.5 MG PO TABS: 40.0000 mg | ORAL_TABLET | Freq: Every day | ORAL | Status: DC

## 2020-12-11 MED ORDER — NITROGLYCERIN 0.4 MG SL SUBL: 0.4000 mg | SUBLINGUAL_TABLET | SUBLINGUAL | Status: DC | PRN

## 2020-12-11 MED ORDER — FENTANYL CITRATE (PF) 100 MCG/2ML IJ SOLN
INTRAMUSCULAR | Status: DC | PRN
  Administered 2020-12-11: 25 ug via INTRAVENOUS

## 2020-12-11 MED ORDER — REGADENOSON 0.4 MG/5ML IV SOLN
0.4000 mg | Freq: Once | INTRAVENOUS | Status: AC
  Administered 2020-12-11: 0.4 mg via INTRAVENOUS

## 2020-12-11 MED ORDER — HYDRALAZINE HCL 20 MG/ML IJ SOLN: 10.0000 mg | Freq: Four times a day (QID) | INTRAMUSCULAR | Status: DC | PRN

## 2020-12-11 MED ORDER — INSULIN ASPART 100 UNIT/ML IJ SOLN: 0.0000 [IU] | Freq: Every day | INTRAMUSCULAR | Status: DC

## 2020-12-11 SURGICAL SUPPLY — 10 items
CATH 5F 110X4 TIG (CATHETERS) ×1 IMPLANT
DEVICE RAD TR BAND REGULAR (VASCULAR PRODUCTS) ×1 IMPLANT
DRAPE BRACHIAL (DRAPES) ×1 IMPLANT
GLIDESHEATH SLEND SS 6F .021 (SHEATH) ×1 IMPLANT
GUIDEWIRE INQWIRE 1.5J.035X260 (WIRE) IMPLANT
INQWIRE 1.5J .035X260CM (WIRE) ×2
PACK CARDIAC CATH (CUSTOM PROCEDURE TRAY) ×2 IMPLANT
PROTECTION STATION PRESSURIZED (MISCELLANEOUS) ×2
SET ATX SIMPLICITY (MISCELLANEOUS) ×1 IMPLANT
STATION PROTECTION PRESSURIZED (MISCELLANEOUS) IMPLANT

## 2020-12-11 NOTE — Progress Notes (Signed)
ANTICOAGULATION CONSULT NOTE - Initial Consult  Pharmacy Consult for IV Heparin Indication: chest pain/ACS  Allergies  Allergen Reactions  . Prednisone Other (See Comments)    Elevated BP  . Atorvastatin     Muscle aches     Patient Measurements: Total Body Weight: 87.6 kg Height:  64 inches Heparin Dosing Weight: 74.3 kg  Vital Signs: Temp: 98.1 F (36.7 C) (05/24 1557) Temp Source: Oral (05/24 1557) BP: 151/78 (05/24 1800) Pulse Rate: 68 (05/24 1800)  Labs: Recent Labs    12/10/20 1239 12/10/20 1735 12/10/20 2002 12/11/20 0226  HGB 14.0  --   --  10.8*  HCT 40.8  --   --  31.2*  PLT 403*  --   --  294  CREATININE 1.04*  --   --  0.73  TROPONINIHS 91* 260* 329* 253*    Estimated Creatinine Clearance: 69.1 mL/min (by C-G formula based on SCr of 0.73 mg/dL).   Medical History: Past Medical History:  Diagnosis Date  . Anxiety   . Arthritis   . Diabetes mellitus without complication (HCC)   . Diffuse cystic mastopathy 2012  . GERD (gastroesophageal reflux disease)   . Heart murmur    ETT myoview 12/09 (Dr. Juel Burrow) no evidence of ischemia, good exercise tolerance - 9 min. echo (11/10): EF 60-65%, nml RV, PASP 25, nml valves  . Hepatitis    as a child   . HLD (hyperlipidemia)   . HTN (hypertension)    for < 10 years  . Obesity   . OSA (obstructive sleep apnea)    mild; uses CPAP  . Rheumatic fever    as a child    Assessment: 70 yr old woman transferred from [facility] to [facility] this afternoon for CABG evaluation. She underwent cardiac cath at [facility] today, which revealed severe 3-vessel CAD with heavy calcification.   Pharmacy is consulted to start IV heparin for CP/ACS 2 hrs after TR band removed (per Waldron Session, [facility] RN, TR band was completely deflated at 1820 PM this evening). Pt was not on anticoagulation PTA to [facility]. At [facility], she was on Lovenox 40 mg/day, with last dose on 5/23 at 2114 PM.  H/H 10.8/31.2, plt 294  Goal of Therapy:   Heparin level 0.3-0.7 units/ml Monitor platelets by anticoagulation protocol: Yes   Plan:  Start heparin infusion (no bolus) at 900 units/hr at 2030 PM this evening Check heparin level in ~7 hrs Monitor daily heparin level, CBC Monitor for bleeding  Vicki Mallet, PharmD, BCPS, [facility] Clinical Pharmacist 12/11/2020,6:17 PM

## 2020-12-11 NOTE — Interval H&P Note (Signed)
History and Physical Interval Note:  12/11/2020 4:17 PM  Erica Brock  has presented today for surgery, with the diagnosis of NSTEMI.  The various methods of treatment have been discussed with the patient and family. After consideration of risks, benefits and other options for treatment, the patient has consented to  Procedure(s): LEFT HEART CATH AND CORONARY ANGIOGRAPHY (N/A) as a surgical intervention.  The patient's history has been reviewed, patient examined, no change in status, stable for surgery.  I have reviewed the patient's chart and labs.  Questions were answered to the patient's satisfaction.    Cath Lab Visit (complete for each Cath Lab visit)  Clinical Evaluation Leading to the Procedure:   ACS: Yes.    Non-ACS:  N/A  [name]

## 2020-12-11 NOTE — Progress Notes (Signed)
Patient updated on plan. Report given to [facility] and [facility] receiving nurse, Misty Stanley RN. VSS. Right radial intact and positive pulse with cap refill. Reported to [facility] to start Heparin drip at 2030. Orders given to [facility] staff. Patient currently has 2 IVs, 22 gauge to right AC, 20 gauge to left hand, saline locked at this time.

## 2020-12-11 NOTE — H&P (View-Only) (Signed)
Cardiology Consultation:   Patient ID: Erica Brock MRN: [id number]; DOB: 12/24/1950  Admit date: 12/10/2020 Date of Consult: 12/11/2020  PCP:  Aldridge, Barbara, MD   [facility]  Cardiologist:  Timothy Gollan, MD  Advanced Practice Provider:  No care team member to display Electrophysiologist:  None [id number]}    Patient Profile:   Erica Brock is a 70 y.o. female with a hx of coronary artery disease with severe three-vessel coronary calcification on CT scan, aortic atherosclerosis, mild MR,  hypertension, hyperlipidemia, obesity, DM2, pulmonary embolism in 2018 in the setting of surgery, rheumatoid arthritis, osteoarthritis, and she presents today with who is being seen today for the evaluation of elevated troponin with nausea /diarrhea /emesis at the request of Dr. Sreenath.  History of Present Illness:   Erica Brock is a 70-year-old female with PMH as above.   CAC Score 2407 in 2018 with diffuse LM and 3v CAC.  2018 MPI ruled low risk though moderate in size defect noted as below and thought to be 2/2 artifact and less likely ischemia.    Echo 09/19/20 showed nl EF and mild MR.   She is followed by [facility] cardiology for HTN and HLD with last office visit 10/05/2020.   She has no history of tobacco use.  She reports occasional alcohol use with 1-2 drinks each occasion.  Family history includes a sister with myocardial infarction and CABG.  He has personal history of a pulmonary embolism in 2018 in the setting of surgery and is s/p OAC.    When last seen in office 10/05/20, BP was elevated at 152/90; however home BP routinely SBP 1 10-1 20 and DBP 60 to 80s.  Home BP cuff checked against that of clinic and found to be 10 points higher on systolic and diastolic readings.  No medication changes were made to her clonidine 0.1 mg twice daily and lisinopril 40 mg daily.  On 12/10/2020, she presented to [facility] ED after experiencing nausea, emesis, and diarrhea that occurred  in close proximity to taking a multivitamin. Diarrhea described as liquid. After her nausea, emesis, and diarrhea, she reports feeling weak and also notes some abdominal discomfort with palpation today, attributed to her earlier GI episode after her pill. She reports a similar episode that occurred shortly after taking her multivitamin before in the past.  She usually eats a banana in the AM before taking this pill. Leading up to the episode, she denies any associated CP/SOB/DOE. She walks for physical activity. She usually does not walk far but denies this as being 2/2 limiting CP/SOB. No CP/SOB with hills or stairs. No LEE other than that chronically associated with an ankle injury and surgery.  She has intermittent abdominal bloating without clear triggers and at times making it difficult for her to fasten her bra.  No orthopnea or other reported sx of volume overload. Her BP has been well controlled on her current medications.  In the emergency department, initial vitals significant for BP 127/93, HR 105 bpm, RR 18.  Initial labs showed AKI with creatinine 1.04, albumin elevated 5.4, leukocytosis with WBC 17.8, platelets elevated at 403, high-sensitivity troponin 91  260  329  253.  EKG showed NSR with LAFB and incomplete RBBB at 84 bpm and no significant ST/T changes.  Past Medical History:  Diagnosis Date  . Anxiety   . Arthritis   . Diabetes mellitus without complication (HCC)   . Diffuse cystic mastopathy 2012  . GERD (gastroesophageal reflux   disease)   . Heart murmur    ETT myoview 12/09 (Dr. Juel Burrow) no evidence of ischemia, good exercise tolerance - 9 min. echo (11/10): EF 60-65%, nml RV, PASP 25, nml valves  . Hepatitis    as a child   . HLD (hyperlipidemia)   . HTN (hypertension)    for < 10 years  . Obesity   . OSA (obstructive sleep apnea)    mild; uses CPAP  . Rheumatic fever    as a child    Past Surgical History:  Procedure Laterality Date  . arthroscopic surgery     L  elbow   . BREAST BIOPSY Left    needle bx-neg  . carpael tunnel release     R hand  . COLONOSCOPY  2008   Dr. Sharen Hint  . FOOT SURGERY Left   . reconstruction foot and bunion surgery - both feet    . TONSILLECTOMY    . TOTAL ABDOMINAL HYSTERECTOMY  1994     Home Medications:  Prior to Admission medications   Medication Sig Start Date End Date Taking? Authorizing Provider  cloNIDine (CATAPRES) 0.1 MG tablet Take 1 tablet (0.1 mg total) by mouth 2 (two) times daily. 04/23/20  Yes Gollan, Tollie Pizza, MD  ezetimibe (ZETIA) 10 MG tablet TAKE 1 TABLET DAILY Patient taking differently: Take 10 mg by mouth daily. 07/12/20  Yes Antonieta Iba, MD  hydroxychloroquine (PLAQUENIL) 200 MG tablet Take 200 mg by mouth 2 (two) times daily. 11/01/20  Yes [provider]  lisinopril (ZESTRIL) 40 MG tablet Take 1 tablet (40 mg total) by mouth daily. 09/25/20 03/24/21 Yes Alver Sorrow, NP  metFORMIN (GLUCOPHAGE-XR) 500 MG 24 hr tablet Take 500 mg by mouth at bedtime. 05/22/15  Yes [provider]  montelukast (SINGULAIR) 10 MG tablet Take 10 mg by mouth daily as needed.   Yes [provider]  pantoprazole (PROTONIX) 40 MG tablet Take 40 mg by mouth daily.     Yes [provider]  rosuvastatin (CRESTOR) 40 MG tablet Take 1 tablet (40 mg total) by mouth daily. 11/09/18  Yes Antonieta Iba, MD    Inpatient Medications: Scheduled Meds: . albuterol  2.5 mg Nebulization Q6H  . cloNIDine  0.1 mg Oral BID  . enoxaparin (LOVENOX) injection  40 mg Subcutaneous Q24H  . ezetimibe  10 mg Oral Daily  . insulin aspart  0-5 Units Subcutaneous QHS  . insulin aspart  0-9 Units Subcutaneous TID WC  . pantoprazole  40 mg Oral QAC breakfast   Continuous Infusions: . sodium chloride 100 mL/hr at 12/10/20 1947   PRN Meds: acetaminophen **OR** acetaminophen, hydrALAZINE, ondansetron (ZOFRAN) IV  Allergies:    Allergies  Allergen Reactions  . Prednisone Other (See Comments)     Elevated BP  . Atorvastatin     Muscle aches     Social History:   Social History   Socioeconomic History  . Marital status: Married    Spouse name: Not on file  . Number of children: Not on file  . Years of education: Not on file  . Highest education level: Not on file  Occupational History  . Not on file  Tobacco Use  . Smoking status: Never Smoker  . Smokeless tobacco: Never Used  . Tobacco comment: tobacco use - no  Vaping Use  . Vaping Use: Never used  Substance and Sexual Activity  . Alcohol use: Yes    Comment: social   . Drug use: No  .  Sexual activity: Not on file  Other Topics Concern  . Not on file  Social History Narrative   Married; full time; does not get regular exercise.    Social Determinants of Health   Financial Resource Strain: Not on file  Food Insecurity: Not on file  Transportation Needs: Not on file  Physical Activity: Not on file  Stress: Not on file  Social Connections: Not on file  Intimate Partner Violence: Not on file    Family History:    Family History  Problem Relation Age of Onset  . Diabetes Father   . Hypertension Father   . Other Father        PPM  . Multiple sclerosis Mother   . Hyperlipidemia Mother   . Heart attack Sister        LATE 30'S, 40  . Aneurysm Brother        brain  . Hypertension Sister   . Hyperlipidemia Sister   . Breast cancer Neg Hx      ROS:  Please see the history of present illness.  Review of Systems  Cardiovascular: Negative for chest pain, palpitations, orthopnea and leg swelling.       No LEE increase from baseline Abdominal bloating  Gastrointestinal: Positive for abdominal pain, diarrhea, nausea and vomiting.  Musculoskeletal: Positive for myalgias.  Neurological: Positive for weakness.  All other systems reviewed and are negative.   All other ROS reviewed and negative.     Physical Exam/Data:   Vitals:   12/10/20 2024 12/10/20 2243 12/11/20 0427 12/11/20 0623  BP: (!) 143/83  124/76 140/78   Pulse: 82 70 67   Resp: 17     Temp: 98.9 F (37.2 C) 97.6 F (36.4 C)    TempSrc: Oral Oral    SpO2: 99% 97% 100%   Weight:  85.2 kg  87.6 kg  Height:        Intake/Output Summary (Last 24 hours) at 12/11/2020 0808 Last data filed at 12/10/2020 2335 Gross per 24 hour  Intake 1266.58 ml  Output --  Net 1266.58 ml   Last 3 Weights 12/11/2020 12/10/2020 12/10/2020  Weight (lbs) 193 lb 2 oz 187 lb 13.3 oz 194 lb  Weight (kg) 87.6 kg 85.2 kg 87.998 kg     Body mass index is 33.15 kg/m.  General:  Well nourished, well developed, in no acute distress HEENT: normal Neck: no JVD Vascular:Radial pulses 2+ bilaterally  Cardiac:  normal S1, S2; RRR; no murmur  Lungs:  clear to auscultation bilaterally, no wheezing, rhonchi or rales  Abd: soft, slightly tender, no hepatomegaly  Ext: trace to no b/l edema Musculoskeletal:  No deformities, BUE and BLE strength normal and equal Skin: warm and dry  Neuro:   no focal abnormalities noted Psych:  Normal affect   EKG:  The EKG was personally reviewed and demonstrates:  EKG showed NSR with LAFB and incomplete RBBB at 84 bpm and no significant ST/T changes.  Telemetry:  Telemetry was personally reviewed and demonstrates:  SB-SR with rates 50-90s  Relevant CV Studies: Echo 09/19/20 1. Left ventricular ejection fraction, by estimation, is 55 to 60%. Left  ventricular ejection fraction by 3D volume is 59 %. The left ventricle has  normal function. The left ventricle has no regional wall motion  abnormalities. There is mild left  ventricular hypertrophy. Left ventricular diastolic parameters are  indeterminate. The average left ventricular global longitudinal strain is  -13.6 %. The global longitudinal strain is abnormal.  2.  Right ventricular systolic function is normal. The right ventricular  size is normal.  3. The mitral valve is grossly normal. Mild mitral valve regurgitation.  4. The aortic valve is tricuspid. Aortic  valve regurgitation is trivial.  Mild aortic valve sclerosis is present, with no evidence of aortic valve  stenosis.  5. The inferior vena cava is normal in size with <50% respiratory  variability, suggesting right atrial pressure of 8 mmHg.   MPI 2018  Low risk study.  There is a moderate in size, mild in severity, partially reversible defect involving the apical inferior, apical lateral, and apical segments. Given normal wall motion on gated images, this most likely represents artifact (apical thinning and attenuation) and less likely ischemia.  Fair exercise capacity without significant ST-segment or T-wave changes.  Normal left ventricular contraction (LVEF 65%).   CAC score 2018 FINDINGS: Non-cardiac: See separate report from [facility].  Ascending Aorta:  3.6 cm  Pericardium: Normal  Coronary arteries: Marked diffuse LM and three vessel coronary artery calcification  IMPRESSION: Coronary calcium score of 2407 . This was 78 th percentile for age and sex matched control.  Would recommend f/u perfusion study given how high calcium score is  Laboratory Data:  High Sensitivity Troponin:   Recent Labs  Lab 12/10/20 1239 12/10/20 1735 12/10/20 2002 12/11/20 0226  TROPONINIHS 91* 260* 329* 253*     Chemistry Recent Labs  Lab 12/10/20 1239 12/11/20 0226  NA 136 135  K 3.6 4.2  CL 99 104  CO2 21* 23  GLUCOSE 147* 86  BUN 15 11  CREATININE 1.04* 0.73  CALCIUM 10.2 8.4*  GFRNONAA 57* >60  ANIONGAP 16* 8    Recent Labs  Lab 12/10/20 1239  PROT 8.8*  ALBUMIN 5.4*  AST 38  ALT 30  ALKPHOS 56  BILITOT 0.7   Hematology Recent Labs  Lab 12/10/20 1239 12/11/20 0226  WBC 17.8* 8.3  RBC 4.58 3.51*  HGB 14.0 10.8*  HCT 40.8 31.2*  MCV 89.1 88.9  MCH 30.6 30.8  MCHC 34.3 34.6  RDW 12.7 12.9  PLT 403* 294   BNPNo results for input(s): BNP, PROBNP in the last 168 hours.  DDimer No results for input(s): DDIMER in the last 168  hours.   Radiology/Studies:  No results found.   Assessment and Plan:   Elevated HS Tn --No current n/v/d. Earlier episode of n/v/d that pt attributes as 2/2 taking a multivitamin that she states was associated with a similar episode in the past when taking this same vitamin. No CP or SOB at rest or with exertion.  --HS Tn peaked at 329 and now down-trending.  --EKG without acute ST/T changes.  --Recent 09/2020 echo with nl EF and NRWMA. --CAC Score 2407 in 2018 with diffuse LM and 3v CAC. 2018 MPI ruled low risk though moderate in size defect noted as below and thought to be 2/2 artifact and less likely ischemia.   --Cannot completely rule out HS Tn elevation 2/2 coronary ischemia, however, given RF including FHx of CAD/MI, HTN, HLD, 3v CAD by CT / CAC score high as above. Thus, further ischemic workup recommended with MPI at this time. Considered is HS Tn elevation 2/2 supply demand ischemia in the setting of tachycardia and mild AKI at presentation and likely 2/2 dehydration in the setting of her v/d.   Lexiscan Myoview this AM 12/11/20.  NPO until stress test.   If stress test ruled low risk and without evidence of ischemia, suspect she  will be able to go home later today from a cardiac perspective and with recommendations regarding anemia/BP per IM as below.  Continue current medications. Aggressive risk factor modification recommended.  Continue to control cholesterol and BP with recommendations regarding antihypertensives as directly below.  Hypertension, poorly controlled --BP currently suboptimal at 159/95 on clonidine 0.1mg  BID. Lisinopril held at presentation in the setting of AKI.  Given recovery of renal function on this morning's labs, restart lisinopril before discharge. At her last clinic visit, BP also noted to be elevated with patient report that home pressures are lower.  Given elevated BP this admission, if BP remains elevated after restart of lisinopril, recommend weaning  her off clonidine now or in the near future as suspect BP is elevated at times 2/2 reflex hypertension between doses of clonidine and that she may benefit from use of an alternative antihypertensive such as amlodipine for BP support moving forward.   Nausea/emesis/diarrhea -- As described above in HPI.  Currently taking metformin 500 mg daily.  With recurrent GI symptoms, could consider transitioning off of this medication to see if resolution of previous GI symptoms per PCP/IM.  Otherwise, recommend she find an alternative to her current multivitamin.  Continue current Protonix.  Anemia -- Hemoglobin 10.8 with hematocrit 31.2. Drop noted in H&H. Recommend repeating CBC to confirm and further work-up to rule out GI bleed prior to discharge and given patient report per review of previous notes of coffee-ground emesis.  Defer to IM.  AKI, resolved -- Initial labs significant for mild AKI, resolving by repeat labs this morning.  Lisinopril initially held and can be restarted prior to discharge for further BP support.  HLD -- Most recent LDL 58 and at goal of below 70.  Continue Crestor 40 mg daily and Zetia 10 mg daily.  OSA on CPAP -- Ongoing CPAP use encouraged from a cardiovascular standpoint.  History of pulmonary embolism -- History of pulmonary embolism by CT 06/2017 s/p Eliquis.  Denies any shortness of breath.  Sinus tachycardia at presentation in the setting of recent N/V/D and likely 2/2 hypovolemia, resolving since that time.  Oxygen saturations stable. No suspicion for recurrent PE at this time based on her presentation and vitals.   For questions or updates, please contact [facility] Please consult [email or web address] for contact info under    Signed, Lennon Alstrom, PA-C  12/11/2020 8:08 AM

## 2020-12-11 NOTE — Progress Notes (Signed)
Mobility Specialist - Progress Note   12/11/20 1500  Mobility  Activity Off unit  Mobility performed by Mobility specialist    Pt being transferred off unit for procedure at time of attempt. Will attempt session another date/time as pt is available.    Erica Brock Mobility Specialist 12/11/20, 3:35 PM

## 2020-12-11 NOTE — Progress Notes (Signed)
Initial Nutrition Assessment  DOCUMENTATION CODES:  Obesity unspecified  INTERVENTION:   Advance diet as able after stress test  Monitor intake trends for need for nutrition supplement  NUTRITION DIAGNOSIS:  Increased nutrient needs related to acute illness as evidenced by estimated needs.  GOAL:  Patient will meet greater than or equal to 90% of their needs  MONITOR:  PO intake,Diet advancement  REASON FOR ASSESSMENT:  Malnutrition Screening Tool    ASSESSMENT:  Pt presented to ED with n/v/d after taking a MVI earlier in the day. Pt reports pt became cold and clammy after GI distress ensued. In ED, troponin elevated, cardiology workup requested. PMH relevant for GERD, HLD, HTN, DM type 2  Pt out of room at the time of assessment, undergoing stress test this AM. Pt reported weight loss in admission screen, minor weight loss is noted over the last 3 months, not significant. Pt reported no change in appetite. Currently NPO for stress test. MD notes that if workup is negative, will be dc later today. Will monitor intake trends after diet advancement to determine if nutrition interventions are needed.     Nutritionally Relevant Medications:  Scheduled Meds: . ezetimibe  10 mg Oral Daily  . insulin aspart  0-5 Units Subcutaneous QHS  . insulin aspart  0-9 Units Subcutaneous TID WC  . pantoprazole  40 mg Oral QAC breakfast   Continuous Infusions: . sodium chloride 100 mL/hr at 12/10/20 1947   PRN Meds: ondansetron  Nutritionally Relevant Labs:  SBG ranges from 86-128 mg/dL over the last 24 hours  HgbA1c 6.3% (5/23)  NUTRITION - FOCUSED PHYSICAL EXAM: Pt out of room at this time - defer to follow-up  Diet Order:   Diet Order            Diet NPO time specified Except for: Sips with Meds  Diet effective now                 EDUCATION NEEDS:  No education needs have been identified at this time  Skin:  Skin Assessment: Reviewed RN Assessment  Last BM:  5/23 - per  RN documentation  Height:  Ht Readings from Last 1 Encounters:  12/10/20 5\' 4"  (1.626 m)    Weight:  Wt Readings from Last 1 Encounters:  12/11/20 87.6 kg    Ideal Body Weight:  54.5 kg  BMI:  Body mass index is 33.15 kg/m.  Estimated Nutritional Needs:   Kcal:  1500-1700 kcal/d  Protein:  75-85 g/d  Fluid:  >1500 mL/d   12/13/20, RD, LDN Clinical Dietitian Pager on Amion

## 2020-12-11 NOTE — Progress Notes (Signed)
PROGRESS NOTE    Erica Brock  GUY:[id number] DOB: 06-30-1951 DOA: 12/10/2020 PCP: Leim Fabry, MD  Brief Narrative:  70 y.o. female with PMH significant for HTN, HLD, DM2, DVT/PE in the past, currently not on anticoagulation, anxiety, arthritis, obesity, sleep apnea, rheumatic fever as a child. Patient woke up in her usual state of health this morning.  She took a multivitamin pill today.  Shortly after that she started having intense nausea followed by profuse vomiting as well as diarrhea.  After several episodes of those, patient looked lethargic, cold clammy.  Her husband called EMS and hence patient was brought to the ED.  She does not remember much of the ambulance ride.  Still feeling woozy.  In the ED, patient was afebrile, heart rate 104, blood pressure 127/93, breathing on room air Labs with WBC count elevated to 17.8, normal hemoglobin and platelets, sodium 136, potassium 3.6, serum bicarb 21, glucose 147, BUN/creatinine 15/1.04, first troponin was elevated to 91. EKG showed normal sinus rhythm at 84 bpm, no ST-T wave changes and QTC 465 ms. Hospitalist service was consulted for overnight observation Troponin had upward trend so cardiology was involved.  Patient underwent nuclear stress testing on 5/24.  Results of stress test are positive with large reversible defect concerning for ischemia.  Patient will likely warrant cardiac catheterization    Assessment & Plan:   Active Problems:   Intractable nausea and vomiting  Intractable nausea and vomiting Acute diarrhea Sudden onset after taking multivitamin Suspect GI intolerance Symptoms resolved the time my evaluation Plan: IV hydration Antiemetics  Elevated troponin Positive stress test Patient with no history of coronary artery disease.  Echocardiogram was normal 1 month ago.  First troponin elevated at 91, second continue to uptrend.  Status post nuclear stress test.  Results of stress test were positive.  Patient  will warrant cardiac catheterization.  Defer to cardiology.  Acute metabolic encephalopathy Near syncope Mental status back at baseline.  Suspect near syncope secondary to several episodes of vomiting.  Essential hypertension Home regimen includes clonidine 0.1 mg twice daily and Lasix as needed, lisinopril 40 mg daily.  Clonidine was continued.  Lisinopril was kept on hold due to AKI.  Now that AKI is resolved can restart lisinopril 40 mg daily.  Diabetes mellitus type 2 Home metformin on hold SSI Accu-Cheks before meals and at bedtime Follow-up A1c Carb modified diet  Hyperlipidemia Not on statin due to reported allergy  History of DVT/PE Provoked in setting of recent surgery.  Completed course of anticoagulation.   DVT prophylaxis: SQ Lovenox  code Status: Full Family Communication: None today.  Offered to call but patient declined Disposition Plan: Status is: Observation  The patient will require care spanning > 2 midnights and should be moved to inpatient because: Inpatient level of care appropriate due to severity of illness  Dispo: The patient is from: Home              Anticipated d/c is to: Home              Patient currently is not medically stable to d/c.   Difficult to place patient No  Presented with intractable nausea and vomiting.-Elevated troponins.  Stress test is positive.  Will likely need cardiac catheterization.  Cardiology follow-up pending.     Level of care: Progressive Cardiac  Consultants:   Cardiology-[facility]  Procedures:   Nuclear stress test  Antimicrobials:   None   Subjective: Seen and examined.  No chest pain at  time my evaluation.  Nausea and vomiting symptoms have improved  Objective: Vitals:   12/10/20 2243 12/11/20 0427 12/11/20 0623 12/11/20 0826  BP: 124/76 140/78  (!) 159/95  Pulse: 70 67  72  Resp:    18  Temp: 97.6 F (36.4 C)   98.4 F (36.9 C)  TempSrc: Oral   Oral  SpO2: 97% 100%  100%  Weight: 85.2 kg  87.6  kg   Height:        Intake/Output Summary (Last 24 hours) at 12/11/2020 1502 Last data filed at 12/10/2020 2335 Gross per 24 hour  Intake 1266.58 ml  Output --  Net 1266.58 ml   Filed Weights   12/10/20 1241 12/10/20 2243 12/11/20 0623  Weight: 88 kg 85.2 kg 87.6 kg    Examination:  General exam: Appears calm and comfortable  Respiratory system: Clear to auscultation. Respiratory effort normal. Cardiovascular system: S1 & S2 heard, RRR. No JVD, murmurs, rubs, gallops or clicks. No pedal edema. Gastrointestinal system: Abdomen is nondistended, soft and nontender. No organomegaly or masses felt. Normal bowel sounds heard. Central nervous system: Alert and oriented. No focal neurological deficits. Extremities: Symmetric 5 x 5 power. Skin: No rashes, lesions or ulcers Psychiatry: Judgement and insight appear normal. Mood & affect appropriate.     Data Reviewed: I have personally reviewed following labs and imaging studies  CBC: Recent Labs  Lab 12/10/20 1239 12/11/20 0226  WBC 17.8* 8.3  HGB 14.0 10.8*  HCT 40.8 31.2*  MCV 89.1 88.9  PLT 403* 294   Basic Metabolic Panel: Recent Labs  Lab 12/10/20 1239 12/11/20 0226  NA 136 135  K 3.6 4.2  CL 99 104  CO2 21* 23  GLUCOSE 147* 86  BUN 15 11  CREATININE 1.04* 0.73  CALCIUM 10.2 8.4*  MG  --  2.0  PHOS  --  3.7   GFR: Estimated Creatinine Clearance: 69.1 mL/min (by C-G formula based on SCr of 0.73 mg/dL). Liver Function Tests: Recent Labs  Lab 12/10/20 1239  AST 38  ALT 30  ALKPHOS 56  BILITOT 0.7  PROT 8.8*  ALBUMIN 5.4*   Recent Labs  Lab 12/10/20 1239  LIPASE 47   No results for input(s): AMMONIA in the last 168 hours. Coagulation Profile: No results for input(s): INR, PROTIME in the last 168 hours. Cardiac Enzymes: No results for input(s): CKTOTAL, CKMB, CKMBINDEX, TROPONINI in the last 168 hours. BNP (last 3 results) No results for input(s): PROBNP in the last 8760 hours. HbA1C: Recent  Labs    12/10/20 1239  HGBA1C 6.3*   CBG: Recent Labs  Lab 12/10/20 1828 12/10/20 2106 12/11/20 0827  GLUCAP 112* 128* 86   Lipid Profile: No results for input(s): CHOL, HDL, LDLCALC, TRIG, CHOLHDL, LDLDIRECT in the last 72 hours. Thyroid Function Tests: No results for input(s): TSH, T4TOTAL, FREET4, T3FREE, THYROIDAB in the last 72 hours. Anemia Panel: No results for input(s): VITAMINB12, FOLATE, FERRITIN, TIBC, IRON, RETICCTPCT in the last 72 hours. Sepsis Labs: No results for input(s): PROCALCITON, LATICACIDVEN in the last 168 hours.  Recent Results (from the past 240 hour(s))  SARS CORONAVIRUS 2 (TAT 6-24 HRS) Nasopharyngeal Nasopharyngeal Swab     Status: None   Collection Time: 12/10/20  4:00 PM   Specimen: Nasopharyngeal Swab  Result Value Ref Range Status   SARS Coronavirus 2 NEGATIVE NEGATIVE Final    Comment: (NOTE) SARS-CoV-2 target nucleic acids are NOT DETECTED.  The SARS-CoV-2 RNA is generally detectable in upper and lower  respiratory specimens during the acute phase of infection. Negative results do not preclude SARS-CoV-2 infection, do not rule out co-infections with other pathogens, and should not be used as the sole basis for treatment or other patient management decisions. Negative results must be combined with clinical observations, patient history, and epidemiological information. The expected result is Negative.  Fact Sheet for Patients: HairSlick.no  Fact Sheet for Healthcare Providers: quierodirigir.com  This test is not yet approved or cleared by the [place] FDA and  has been authorized for detection and/or diagnosis of SARS-CoV-2 by FDA under an Emergency Use Authorization (EUA). This EUA will remain  in effect (meaning this test can be used) for the duration of the COVID-19 declaration under Se ction 564(b)(1) of the Act, 21 U.S.C. section 360bbb-3(b)(1), unless the authorization  is terminated or revoked sooner.  Performed at [facility], [address], [place], [place] [address]          Radiology Studies: NM Myocar Multi W/Spect W/Wall Motion / EF  Result Date: 12/11/2020  Abnormal pharmacologic myocardial perfusion stress test.  There is a moderate in size, severe, reversible defect involving the basal and mid anterolateral and inferolateral segments consistent with ischemia.  There is a small in size, severe, fixed mid anteroseptal and apical septal defect consistent with scar.  The left ventricular ejection fraction is mildly decreased (48%).  Attenuation correction CT demonstrates coronary artery calcification and aortic atherosclerosis.  This is a high risk study.         Scheduled Meds: . cloNIDine  0.1 mg Oral BID  . enoxaparin (LOVENOX) injection  40 mg Subcutaneous Q24H  . ezetimibe  10 mg Oral Daily  . insulin aspart  0-5 Units Subcutaneous QHS  . insulin aspart  0-9 Units Subcutaneous TID WC  . pantoprazole  40 mg Oral QAC breakfast   Continuous Infusions: . sodium chloride 100 mL/hr at 12/10/20 1947     LOS: 0 days    Time spent: 25 minutes    Tresa Moore, MD [facility] Pager [phone]  If 7PM-7AM, please contact night-coverage 12/11/2020, 3:02 PM

## 2020-12-11 NOTE — Progress Notes (Signed)
Pt scored a 3 on the RT protocol assessment. She is clear, does not take nebulizer treatments at home and has been refusing. The treatments are to be changed to prn. Pr is aware and agrees.

## 2020-12-11 NOTE — Consult Note (Addendum)
Cardiology Consultation:   Patient ID: SONOMA FIRKUS MRN: [id number]; DOB: Mar 03, 1951  Admit date: 12/10/2020 Date of Consult: 12/11/2020  PCP:  Leim Fabry, MD   [facility]  Cardiologist:  Julien Nordmann, MD  Advanced Practice Provider:  No care team member to display Electrophysiologist:  None [id number]}    Patient Profile:   Erica Brock is a 70 y.o. female with a hx of coronary artery disease with severe three-vessel coronary calcification on CT scan, aortic atherosclerosis, mild MR,  hypertension, hyperlipidemia, obesity, DM2, pulmonary embolism in 2018 in the setting of surgery, rheumatoid arthritis, osteoarthritis, and she presents today with who is being seen today for the evaluation of elevated troponin with nausea [name] /emesis at the request of Dr. Georgeann Oppenheim.  History of Present Illness:   Ms. Erica Brock is a 70 year old female with PMH as above.   CAC Score 2407 in 2018 with diffuse LM and 3v CAC.  2018 MPI ruled low risk though moderate in size defect noted as below and thought to be 2/2 artifact and less likely ischemia.    Echo 09/19/20 showed nl EF and mild MR.   She is followed by [facility] cardiology for HTN and HLD with last office visit 10/05/2020.   She has no history of tobacco use.  She reports occasional alcohol use with 1-2 drinks each occasion.  Family history includes a sister with myocardial infarction and CABG.  He has personal history of a pulmonary embolism in 2018 in the setting of surgery and is s/p OAC.    When last seen in office 10/05/20, BP was elevated at 152/90; however home BP routinely SBP 1 10-1 20 and DBP 60 to 80s.  Home BP cuff checked against that of clinic and found to be 10 points higher on systolic and diastolic readings.  No medication changes were made to her clonidine 0.1 mg twice daily and lisinopril 40 mg daily.  On 12/10/2020, she presented to [facility] ED after experiencing nausea, emesis, and diarrhea that occurred  in close proximity to taking a multivitamin. Diarrhea described as liquid. After her nausea, emesis, and diarrhea, she reports feeling weak and also notes some abdominal discomfort with palpation today, attributed to her earlier GI episode after her pill. She reports a similar episode that occurred shortly after taking her multivitamin before in the past.  She usually eats a banana in the AM before taking this pill. Leading up to the episode, she denies any associated CP/SOB/DOE. She walks for physical activity. She usually does not walk far but denies this as being 2/2 limiting CP/SOB. No CP/SOB with hills or stairs. No LEE other than that chronically associated with an ankle injury and surgery.  She has intermittent abdominal bloating without clear triggers and at times making it difficult for her to fasten her bra.  No orthopnea or other reported sx of volume overload. Her BP has been well controlled on her current medications.  In the emergency department, initial vitals significant for BP 127/93, HR 105 bpm, RR 18.  Initial labs showed AKI with creatinine 1.04, albumin elevated 5.4, leukocytosis with WBC 17.8, platelets elevated at 403, high-sensitivity troponin 91  260  329  253.  EKG showed NSR with LAFB and incomplete RBBB at 84 bpm and no significant ST/T changes.  Past Medical History:  Diagnosis Date  . Anxiety   . Arthritis   . Diabetes mellitus without complication (HCC)   . Diffuse cystic mastopathy 2012  . GERD (gastroesophageal reflux  disease)   . Heart murmur    ETT myoview 12/09 (Dr. Juel Burrow) no evidence of ischemia, good exercise tolerance - 9 min. echo (11/10): EF 60-65%, nml RV, PASP 25, nml valves  . Hepatitis    as a child   . HLD (hyperlipidemia)   . HTN (hypertension)    for < 10 years  . Obesity   . OSA (obstructive sleep apnea)    mild; uses CPAP  . Rheumatic fever    as a child    Past Surgical History:  Procedure Laterality Date  . arthroscopic surgery     L  elbow   . BREAST BIOPSY Left    needle bx-neg  . carpael tunnel release     R hand  . COLONOSCOPY  2008   Dr. Sharen Hint  . FOOT SURGERY Left   . reconstruction foot and bunion surgery - both feet    . TONSILLECTOMY    . TOTAL ABDOMINAL HYSTERECTOMY  1994     Home Medications:  Prior to Admission medications   Medication Sig Start Date End Date Taking? Authorizing Provider  cloNIDine (CATAPRES) 0.1 MG tablet Take 1 tablet (0.1 mg total) by mouth 2 (two) times daily. 04/23/20  Yes Gollan, Tollie Pizza, MD  ezetimibe (ZETIA) 10 MG tablet TAKE 1 TABLET DAILY Patient taking differently: Take 10 mg by mouth daily. 07/12/20  Yes Antonieta Iba, MD  hydroxychloroquine (PLAQUENIL) 200 MG tablet Take 200 mg by mouth 2 (two) times daily. 11/01/20  Yes [provider]  lisinopril (ZESTRIL) 40 MG tablet Take 1 tablet (40 mg total) by mouth daily. 09/25/20 03/24/21 Yes Alver Sorrow, NP  metFORMIN (GLUCOPHAGE-XR) 500 MG 24 hr tablet Take 500 mg by mouth at bedtime. 05/22/15  Yes [provider]  montelukast (SINGULAIR) 10 MG tablet Take 10 mg by mouth daily as needed.   Yes [provider]  pantoprazole (PROTONIX) 40 MG tablet Take 40 mg by mouth daily.     Yes [provider]  rosuvastatin (CRESTOR) 40 MG tablet Take 1 tablet (40 mg total) by mouth daily. 11/09/18  Yes Antonieta Iba, MD    Inpatient Medications: Scheduled Meds: . albuterol  2.5 mg Nebulization Q6H  . cloNIDine  0.1 mg Oral BID  . enoxaparin (LOVENOX) injection  40 mg Subcutaneous Q24H  . ezetimibe  10 mg Oral Daily  . insulin aspart  0-5 Units Subcutaneous QHS  . insulin aspart  0-9 Units Subcutaneous TID WC  . pantoprazole  40 mg Oral QAC breakfast   Continuous Infusions: . sodium chloride 100 mL/hr at 12/10/20 1947   PRN Meds: acetaminophen **OR** acetaminophen, hydrALAZINE, ondansetron (ZOFRAN) IV  Allergies:    Allergies  Allergen Reactions  . Prednisone Other (See Comments)     Elevated BP  . Atorvastatin     Muscle aches     Social History:   Social History   Socioeconomic History  . Marital status: Married    Spouse name: Not on file  . Number of children: Not on file  . Years of education: Not on file  . Highest education level: Not on file  Occupational History  . Not on file  Tobacco Use  . Smoking status: Never Smoker  . Smokeless tobacco: Never Used  . Tobacco comment: tobacco use - no  Vaping Use  . Vaping Use: Never used  Substance and Sexual Activity  . Alcohol use: Yes    Comment: social   . Drug use: No  .  Sexual activity: Not on file  Other Topics Concern  . Not on file  Social History Narrative   Married; full time; does not get regular exercise.    Social Determinants of Health   Financial Resource Strain: Not on file  Food Insecurity: Not on file  Transportation Needs: Not on file  Physical Activity: Not on file  Stress: Not on file  Social Connections: Not on file  Intimate Partner Violence: Not on file    Family History:    Family History  Problem Relation Age of Onset  . Diabetes Father   . Hypertension Father   . Other Father        PPM  . Multiple sclerosis Mother   . Hyperlipidemia Mother   . Heart attack Sister        LATE 30'S, 40  . Aneurysm Brother        brain  . Hypertension Sister   . Hyperlipidemia Sister   . Breast cancer Neg Hx      ROS:  Please see the history of present illness.  Review of Systems  Cardiovascular: Negative for chest pain, palpitations, orthopnea and leg swelling.       No LEE increase from baseline Abdominal bloating  Gastrointestinal: Positive for abdominal pain, diarrhea, nausea and vomiting.  Musculoskeletal: Positive for myalgias.  Neurological: Positive for weakness.  All other systems reviewed and are negative.   All other ROS reviewed and negative.     Physical Exam/Data:   Vitals:   12/10/20 2024 12/10/20 2243 12/11/20 0427 12/11/20 0623  BP: (!) 143/83  124/76 140/78   Pulse: 82 70 67   Resp: 17     Temp: 98.9 F (37.2 C) 97.6 F (36.4 C)    TempSrc: Oral Oral    SpO2: 99% 97% 100%   Weight:  85.2 kg  87.6 kg  Height:        Intake/Output Summary (Last 24 hours) at 12/11/2020 0808 Last data filed at 12/10/2020 2335 Gross per 24 hour  Intake 1266.58 ml  Output --  Net 1266.58 ml   Last 3 Weights 12/11/2020 12/10/2020 12/10/2020  Weight (lbs) 193 lb 2 oz 187 lb 13.3 oz 194 lb  Weight (kg) 87.6 kg 85.2 kg 87.998 kg     Body mass index is 33.15 kg/m.  General:  Well nourished, well developed, in no acute distress HEENT: normal Neck: no JVD Vascular:Radial pulses 2+ bilaterally  Cardiac:  normal S1, S2; RRR; no murmur  Lungs:  clear to auscultation bilaterally, no wheezing, rhonchi or rales  Abd: soft, slightly tender, no hepatomegaly  Ext: trace to no b/l edema Musculoskeletal:  No deformities, BUE and BLE strength normal and equal Skin: warm and dry  Neuro:   no focal abnormalities noted Psych:  Normal affect   EKG:  The EKG was personally reviewed and demonstrates:  EKG showed NSR with LAFB and incomplete RBBB at 84 bpm and no significant ST/T changes.  Telemetry:  Telemetry was personally reviewed and demonstrates:  SB-SR with rates 50-90s  Relevant CV Studies: Echo 09/19/20 1. Left ventricular ejection fraction, by estimation, is 55 to 60%. Left  ventricular ejection fraction by 3D volume is 59 %. The left ventricle has  normal function. The left ventricle has no regional wall motion  abnormalities. There is mild left  ventricular hypertrophy. Left ventricular diastolic parameters are  indeterminate. The average left ventricular global longitudinal strain is  -13.6 %. The global longitudinal strain is abnormal.  2.  Right ventricular systolic function is normal. The right ventricular  size is normal.  3. The mitral valve is grossly normal. Mild mitral valve regurgitation.  4. The aortic valve is tricuspid. Aortic  valve regurgitation is trivial.  Mild aortic valve sclerosis is present, with no evidence of aortic valve  stenosis.  5. The inferior vena cava is normal in size with <50% respiratory  variability, suggesting right atrial pressure of 8 mmHg.   MPI 2018  Low risk study.  There is a moderate in size, mild in severity, partially reversible defect involving the apical inferior, apical lateral, and apical segments. Given normal wall motion on gated images, this most likely represents artifact (apical thinning and attenuation) and less likely ischemia.  Fair exercise capacity without significant ST-segment or T-wave changes.  Normal left ventricular contraction (LVEF 65%).   CAC score 2018 FINDINGS: Non-cardiac: See separate report from [facility].  Ascending Aorta:  3.6 cm  Pericardium: Normal  Coronary arteries: Marked diffuse LM and three vessel coronary artery calcification  IMPRESSION: Coronary calcium score of 2407 . This was 78 th percentile for age and sex matched control.  Would recommend f/u perfusion study given how high calcium score is  Laboratory Data:  High Sensitivity Troponin:   Recent Labs  Lab 12/10/20 1239 12/10/20 1735 12/10/20 2002 12/11/20 0226  TROPONINIHS 91* 260* 329* 253*     Chemistry Recent Labs  Lab 12/10/20 1239 12/11/20 0226  NA 136 135  K 3.6 4.2  CL 99 104  CO2 21* 23  GLUCOSE 147* 86  BUN 15 11  CREATININE 1.04* 0.73  CALCIUM 10.2 8.4*  GFRNONAA 57* >60  ANIONGAP 16* 8    Recent Labs  Lab 12/10/20 1239  PROT 8.8*  ALBUMIN 5.4*  AST 38  ALT 30  ALKPHOS 56  BILITOT 0.7   Hematology Recent Labs  Lab 12/10/20 1239 12/11/20 0226  WBC 17.8* 8.3  RBC 4.58 3.51*  HGB 14.0 10.8*  HCT 40.8 31.2*  MCV 89.1 88.9  MCH 30.6 30.8  MCHC 34.3 34.6  RDW 12.7 12.9  PLT 403* 294   BNPNo results for input(s): BNP, PROBNP in the last 168 hours.  DDimer No results for input(s): DDIMER in the last 168  hours.   Radiology/Studies:  No results found.   Assessment and Plan:   Elevated HS Tn --No current n/v/d. Earlier episode of n/v/d that pt attributes as 2/2 taking a multivitamin that she states was associated with a similar episode in the past when taking this same vitamin. No CP or SOB at rest or with exertion.  --HS Tn peaked at 329 and now down-trending.  --EKG without acute ST/T changes.  --Recent 09/2020 echo with nl EF and NRWMA. --CAC Score 2407 in 2018 with diffuse LM and 3v CAC. 2018 MPI ruled low risk though moderate in size defect noted as below and thought to be 2/2 artifact and less likely ischemia.   --Cannot completely rule out HS Tn elevation 2/2 coronary ischemia, however, given RF including FHx of CAD/MI, HTN, HLD, 3v CAD by CT / CAC score high as above. Thus, further ischemic workup recommended with MPI at this time. Considered is HS Tn elevation 2/2 supply demand ischemia in the setting of tachycardia and mild AKI at presentation and likely 2/2 dehydration in the setting of her v/d.   Lexiscan Myoview this AM 12/11/20.  NPO until stress test.   If stress test ruled low risk and without evidence of ischemia, suspect she  will be able to go home later today from a cardiac perspective and with recommendations regarding anemia/BP per IM as below.  Continue current medications. Aggressive risk factor modification recommended.  Continue to control cholesterol and BP with recommendations regarding antihypertensives as directly below.  Hypertension, poorly controlled --BP currently suboptimal at 159/95 on clonidine 0.1mg  BID. Lisinopril held at presentation in the setting of AKI.  Given recovery of renal function on this morning's labs, restart lisinopril before discharge. At her last clinic visit, BP also noted to be elevated with patient report that home pressures are lower.  Given elevated BP this admission, if BP remains elevated after restart of lisinopril, recommend weaning  her off clonidine now or in the near future as suspect BP is elevated at times 2/2 reflex hypertension between doses of clonidine and that she may benefit from use of an alternative antihypertensive such as amlodipine for BP support moving forward.   Nausea/emesis/diarrhea -- As described above in HPI.  Currently taking metformin 500 mg daily.  With recurrent GI symptoms, could consider transitioning off of this medication to see if resolution of previous GI symptoms per PCP/IM.  Otherwise, recommend she find an alternative to her current multivitamin.  Continue current Protonix.  Anemia -- Hemoglobin 10.8 with hematocrit 31.2. Drop noted in H&H. Recommend repeating CBC to confirm and further work-up to rule out GI bleed prior to discharge and given patient report per review of previous notes of coffee-ground emesis.  Defer to IM.  AKI, resolved -- Initial labs significant for mild AKI, resolving by repeat labs this morning.  Lisinopril initially held and can be restarted prior to discharge for further BP support.  HLD -- Most recent LDL 58 and at goal of below 70.  Continue Crestor 40 mg daily and Zetia 10 mg daily.  OSA on CPAP -- Ongoing CPAP use encouraged from a cardiovascular standpoint.  History of pulmonary embolism -- History of pulmonary embolism by CT 06/2017 s/p Eliquis.  Denies any shortness of breath.  Sinus tachycardia at presentation in the setting of recent N/V/D and likely 2/2 hypovolemia, resolving since that time.  Oxygen saturations stable. No suspicion for recurrent PE at this time based on her presentation and vitals.   For questions or updates, please contact [facility] Please consult [email or web address] for contact info under    Signed, Lennon Alstrom, PA-C  12/11/2020 8:08 AM

## 2020-12-11 NOTE — Discharge Summary (Signed)
Physician Discharge Summary  Erica Brock:[id number] DOB: 08-22-1950 DOA: 12/10/2020  PCP: Leim Fabry, MD  Admit date: 12/10/2020 Discharge date: 12/11/2020  Admitted From: Home Disposition: Transfer to [facility]  Recommendations for Outpatient Follow-up:  1. N/AA  Home Health: N/A Equipment/Devices: None Discharge Condition: Stable CODE STATUS: Full Diet recommendation: Heart healthy Brief/Interim Summary: 70 y.o.femalewith PMH significant for HTN, HLD, DM2, DVT/PE in the past, currently not on anticoagulation, anxiety, arthritis, obesity, sleep apnea, rheumatic fever as a child. Patient woke up in her usual state of health this morning. She took a multivitamin pill today. Shortly after that she started having intense nausea followed by profuse vomiting as well as diarrhea. After several episodes of those, patient looked lethargic, cold clammy. Her husband called EMS and hence patient was brought to the ED. She does not remember much of the ambulance ride. Still feeling woozy. In the ED, patient was afebrile, heart rate 104, blood pressure 127/93, breathing on room air Labs with WBC count elevated to 17.8, normal hemoglobin and platelets, sodium 136, potassium 3.6, serum bicarb 21, glucose 147, BUN/creatinine 15/1.04, first troponinwaselevated to 91. EKG showed normal sinus rhythm at 84 bpm, no ST-T wave changes and QTC 465 ms. Hospitalist service was consulted for overnight observation Troponin had upward trend so cardiology was involved.  Patient underwent nuclear stress testing on 5/24.  Results of stress test are positive with large reversible defect concerning for ischemia.  Patient will likely warrant cardiac catheterization  Cardiac catheterization results revealed multivessel disease.  Case discussed with cardiologist Dr. Okey Dupre.  Recommend transfer to [facility] for CABG evaluation.  Discharge orders placed and med rec completed.  Accepting  physician will be Dr. Janne Napoleon.  Discharge Diagnoses:  Active Problems:   Intractable nausea and vomiting   Abnormal stress test   Non-ST elevation (NSTEMI) myocardial infarction [facility])  Multivessel coronary artery disease Possible NSTEMI  Patient presented after acute onset nausea and vomiting associated diarrhea.  Felt to be due to multivitamin intolerance.  After troponins found to be elevated cardiology involved.  Patient underwent stress test which was positive.  Follow-up cardiac catheterization revealed multivessel coronary artery disease.  Case discussed with cardiology at [facility].  Recommend transfer to [facility] for CABG evaluation.  Discharge orders placed.  Accepting physician Janne Napoleon.  Patient will be discharged once bed is available.    Discharge Instructions  Discharge Instructions    Diet - low sodium heart healthy   Complete by: As directed    Increase activity slowly   Complete by: As directed      Allergies as of 12/11/2020      Reactions   Prednisone Other (See Comments)   Elevated BP   Atorvastatin    Muscle aches       Medication List    TAKE these medications   cloNIDine 0.1 MG tablet Commonly known as: CATAPRES Take 1 tablet (0.1 mg total) by mouth 2 (two) times daily.   ezetimibe 10 MG tablet Commonly known as: ZETIA TAKE 1 TABLET DAILY   hydroxychloroquine 200 MG tablet Commonly known as: PLAQUENIL Take 200 mg by mouth 2 (two) times daily.   lisinopril 40 MG tablet Commonly known as: ZESTRIL Take 1 tablet (40 mg total) by mouth daily.   metFORMIN 500 MG 24 hr tablet Commonly known as: GLUCOPHAGE-XR Take 500 mg by mouth at bedtime.   montelukast 10 MG tablet Commonly known as: SINGULAIR Take 10 mg by mouth daily as  needed.   pantoprazole 40 MG tablet Commonly known as: PROTONIX Take 40 mg by mouth daily.   rosuvastatin 40 MG tablet Commonly known as: CRESTOR Take 1 tablet (40 mg total) by mouth  daily.       Allergies  Allergen Reactions  . Prednisone Other (See Comments)    Elevated BP  . Atorvastatin     Muscle aches     Consultations:  Cardiology-[facility]   Procedures/Studies: NM Myocar Multi W/Spect W/Wall Motion / EF  Result Date: 12/11/2020  Abnormal pharmacologic myocardial perfusion stress test.  There is a moderate in size, severe, reversible defect involving the basal and mid anterolateral and inferolateral segments consistent with ischemia.  There is a small in size, severe, fixed mid anteroseptal and apical septal defect consistent with scar.  The left ventricular ejection fraction is mildly decreased (48%).  Attenuation correction CT demonstrates coronary artery calcification and aortic atherosclerosis.  This is a high risk study.     (Echo, Carotid, EGD, Colonoscopy, ERCP)    Subjective: Seen and examined at the time of discharge.  Chest pain resolved.  Discharge Exam: Vitals:   12/11/20 1557 12/11/20 1700  BP: (!) 164/103 (!) 156/62  Pulse: 69 62  Resp: 17 15  Temp: 98.1 F (36.7 C)   SpO2: 100% 99%   Vitals:   12/11/20 0623 12/11/20 0826 12/11/20 1557 12/11/20 1700  BP:  (!) 159/95 (!) 164/103 (!) 156/62  Pulse:  72 69 62  Resp:  18 17 15   Temp:  98.4 F (36.9 C) 98.1 F (36.7 C)   TempSrc:  Oral Oral   SpO2:  100% 100% 99%  Weight: 87.6 kg     Height:        General: Pt is alert, awake, not in acute distress Cardiovascular: RRR, S1/S2 +, no rubs, no gallops Respiratory: CTA bilaterally, no wheezing, no rhonchi Abdominal: Soft, NT, ND, bowel sounds + Extremities: no edema, no cyanosis    The results of significant diagnostics from this hospitalization (including imaging, microbiology, ancillary and laboratory) are listed below for reference.     Microbiology: Recent Results (from the past 240 hour(s))  SARS CORONAVIRUS 2 (TAT 6-24 HRS) Nasopharyngeal Nasopharyngeal Swab     Status: None   Collection Time: 12/10/20  4:00  PM   Specimen: Nasopharyngeal Swab  Result Value Ref Range Status   SARS Coronavirus 2 NEGATIVE NEGATIVE Final    Comment: (NOTE) SARS-CoV-2 target nucleic acids are NOT DETECTED.  The SARS-CoV-2 RNA is generally detectable in upper and lower respiratory specimens during the acute phase of infection. Negative results do not preclude SARS-CoV-2 infection, do not rule out co-infections with other pathogens, and should not be used as the sole basis for treatment or other patient management decisions. Negative results must be combined with clinical observations, patient history, and epidemiological information. The expected result is Negative.  Fact Sheet for Patients: 12/12/20  Fact Sheet for Healthcare Providers: HairSlick.no  This test is not yet approved or cleared by the [place] FDA and  has been authorized for detection and/or diagnosis of SARS-CoV-2 by FDA under an Emergency Use Authorization (EUA). This EUA will remain  in effect (meaning this test can be used) for the duration of the COVID-19 declaration under Se ction 564(b)(1) of the Act, 21 U.S.C. section 360bbb-3(b)(1), unless the authorization is terminated or revoked sooner.  Performed at [facility], [address], [place], [place]      Labs: BNP (last  3 results) No results for input(s): BNP in the last 8760 hours. Basic Metabolic Panel: Recent Labs  Lab 12/10/20 1239 12/11/20 0226  NA 136 135  K 3.6 4.2  CL 99 104  CO2 21* 23  GLUCOSE 147* 86  BUN 15 11  CREATININE 1.04* 0.73  CALCIUM 10.2 8.4*  MG  --  2.0  PHOS  --  3.7   Liver Function Tests: Recent Labs  Lab 12/10/20 1239  AST 38  ALT 30  ALKPHOS 56  BILITOT 0.7  PROT 8.8*  ALBUMIN 5.4*   Recent Labs  Lab 12/10/20 1239  LIPASE 47   No results for input(s): AMMONIA in the last 168 hours. CBC: Recent Labs  Lab 12/10/20 1239 12/11/20 0226  WBC  17.8* 8.3  HGB 14.0 10.8*  HCT 40.8 31.2*  MCV 89.1 88.9  PLT 403* 294   Cardiac Enzymes: No results for input(s): CKTOTAL, CKMB, CKMBINDEX, TROPONINI in the last 168 hours. BNP: Invalid input(s): POCBNP CBG: Recent Labs  Lab 12/10/20 1828 12/10/20 2106 12/11/20 0827 12/11/20 1550 12/11/20 1705  GLUCAP 112* 128* 86 78 77   D-Dimer No results for input(s): DDIMER in the last 72 hours. Hgb A1c Recent Labs    12/10/20 1239  HGBA1C 6.3*   Lipid Profile No results for input(s): CHOL, HDL, LDLCALC, TRIG, CHOLHDL, LDLDIRECT in the last 72 hours. Thyroid function studies No results for input(s): TSH, T4TOTAL, T3FREE, THYROIDAB in the last 72 hours.  Invalid input(s): FREET3 Anemia work up No results for input(s): VITAMINB12, FOLATE, FERRITIN, TIBC, IRON, RETICCTPCT in the last 72 hours. Urinalysis    Component Value Date/Time   COLORURINE YELLOW (A) 07/05/2015 2254   APPEARANCEUR CLOUDY (A) 07/05/2015 2254   APPEARANCEUR HAZY 12/12/2013 1041   LABSPEC 1.014 07/05/2015 2254   LABSPEC 1.015 12/12/2013 1041   PHURINE 5.0 07/05/2015 2254   GLUCOSEU NEGATIVE 07/05/2015 2254   GLUCOSEU NEGATIVE 12/12/2013 1041   HGBUR 3+ (A) 07/05/2015 2254   BILIRUBINUR NEGATIVE 07/05/2015 2254   BILIRUBINUR NEGATIVE 12/12/2013 1041   KETONESUR TRACE (A) 07/05/2015 2254   PROTEINUR >500 (A) 07/05/2015 2254   NITRITE POSITIVE (A) 07/05/2015 2254   LEUKOCYTESUR 3+ (A) 07/05/2015 2254   LEUKOCYTESUR 3+ 12/12/2013 1041   Sepsis Labs Invalid input(s): PROCALCITONIN,  WBC,  LACTICIDVEN Microbiology Recent Results (from the past 240 hour(s))  SARS CORONAVIRUS 2 (TAT 6-24 HRS) Nasopharyngeal Nasopharyngeal Swab     Status: None   Collection Time: 12/10/20  4:00 PM   Specimen: Nasopharyngeal Swab  Result Value Ref Range Status   SARS Coronavirus 2 NEGATIVE NEGATIVE Final    Comment: (NOTE) SARS-CoV-2 target nucleic acids are NOT DETECTED.  The SARS-CoV-2 RNA is generally detectable in  upper and lower respiratory specimens during the acute phase of infection. Negative results do not preclude SARS-CoV-2 infection, do not rule out co-infections with other pathogens, and should not be used as the sole basis for treatment or other patient management decisions. Negative results must be combined with clinical observations, patient history, and epidemiological information. The expected result is Negative.  Fact Sheet for Patients: HairSlick.no  Fact Sheet for Healthcare Providers: quierodirigir.com  This test is not yet approved or cleared by the [place] FDA and  has been authorized for detection and/or diagnosis of SARS-CoV-2 by FDA under an Emergency Use Authorization (EUA). This EUA will remain  in effect (meaning this test can be used) for the duration of the COVID-19 declaration under Se ction 564(b)(1) of the Act,  21 U.S.C. section 360bbb-3(b)(1), unless the authorization is terminated or revoked sooner.  Performed at [facility], [address] [place], [place] [address]      Time coordinating discharge: Over 30 minutes  SIGNED:   Tresa Moore, MD  [facility] 12/11/2020, 5:20 PM Pager   If 7PM-7AM, please contact night-coverage

## 2020-12-11 NOTE — H&P (Signed)
Cardiology Admission History and Physical:   Patient ID: [name] MRN: [id number]; DOB: 11-20-1950   Admission date: 12/11/20  PCP:  Erica Fabry, MD   [facility] Providers Cardiologist:  Erica Nordmann, MD   Chief Complaint: Nausea, vomiting, and diarrhea  Patient Profile:   Erica Brock is a 70 y.o. female with multivessel coronary artery disease diagnosed by catheterization today, aortic atherosclerosis, mild mitral Erica Brock, hypertension, hyperlipidemia, type 2 diabetes mellitus, pulmonary embolism in the setting of surgery in 2018, obesity, rheumatoid arthritis, and osteoarthritis who is being seen 12/11/2020 for the evaluation of coronary artery disease.  History of Present Illness:   Erica Brock presented to [facility] yesterday with lightheadedness in the setting of nausea, vomiting, and diarrhea that began after taking a vitamin.  She had experienced similar albeit less severe symptoms a few weeks ago when she took the same vitamin.  She denied chest pain, shortness of breath, palpitations, and edema.  In the emergency department, she was noted to have mildly elevated high-sensitivity troponin I, peaking at 329.  Due to her elevated troponin and multiple cardiac risk factors in the setting of nonspecific symptoms, she was referred for myocardial perfusion stress testing.  This was high risk, demonstrating a fixed mid anteroseptal and apical septal defect suggestive of scar and a reversible basal and mid anterolateral/inferolateral defect consistent with ischemia.  Subsequent catheterization today revealed heavily calcified coronary arteries with significant three-vessel disease (see details below).  Erica Brock has therefore been transferred to [facility] for cardiac surgery consultation.   Past Medical History:  Diagnosis Date  . Anxiety   . Arthritis   . Diabetes mellitus without complication (HCC)   . Diffuse cystic mastopathy 2012  . GERD (gastroesophageal reflux  disease)   . Heart murmur    ETT myoview 12/09 (Dr. Juel Brock) no evidence of ischemia, good exercise tolerance - 9 min. echo (11/10): EF 60-65%, nml RV, PASP 25, nml valves  . Hepatitis    as a child   . HLD (hyperlipidemia)   . HTN (hypertension)    for < 10 years  . Obesity   . OSA (obstructive sleep apnea)    mild; uses CPAP  . Rheumatic fever    as a child    Past Surgical History:  Procedure Laterality Date  . arthroscopic surgery     L elbow   . BREAST BIOPSY Left    needle bx-neg  . carpael tunnel release     R hand  . COLONOSCOPY  2008   Dr. Sharen Brock  . FOOT SURGERY Left   . reconstruction foot and bunion surgery - both feet    . TONSILLECTOMY    . TOTAL ABDOMINAL HYSTERECTOMY  1994     Medications Prior to Admission: Prior to Admission medications   Medication Sig Start Date Latanja Lehenbauer Date Taking? Authorizing Provider  cloNIDine (CATAPRES) 0.1 MG tablet Take 1 tablet (0.1 mg total) by mouth 2 (two) times daily. 04/23/20   Erica Iba, MD  ezetimibe (ZETIA) 10 MG tablet TAKE 1 TABLET DAILY Patient taking differently: Take 10 mg by mouth daily. 07/12/20   Erica Iba, MD  hydroxychloroquine (PLAQUENIL) 200 MG tablet Take 200 mg by mouth 2 (two) times daily. 11/01/20   [provider]  lisinopril (ZESTRIL) 40 MG tablet Take 1 tablet (40 mg total) by mouth daily. 09/25/20 03/24/21  Erica Sorrow, NP  metFORMIN (GLUCOPHAGE-XR) 500 MG 24 hr tablet Take 500 mg by mouth at bedtime.  05/22/15   [provider]  montelukast (SINGULAIR) 10 MG tablet Take 10 mg by mouth daily as needed.    [provider]  pantoprazole (PROTONIX) 40 MG tablet Take 40 mg by mouth daily.      [provider]  rosuvastatin (CRESTOR) 40 MG tablet Take 1 tablet (40 mg total) by mouth daily. 11/09/18   Erica Iba, MD     Allergies:    Allergies  Allergen Reactions  . Prednisone Other (See Comments)    Elevated BP  . Atorvastatin     Muscle aches      Social History:   Social History   Tobacco Use  . Smoking status: Never Smoker  . Smokeless tobacco: Never Used  . Tobacco comment: tobacco use - no  Vaping Use  . Vaping Use: Never used  Substance Use Topics  . Alcohol use: Yes    Comment: social   . Drug use: No     Family History:   The patient's family history includes Aneurysm in her brother; Diabetes in her father; Heart attack in her sister; Hyperlipidemia in her mother and sister; Hypertension in her father and sister; Multiple sclerosis in her mother; Other in her father. There is no history of Breast cancer.    ROS:  Please see the history of present illness.  All other ROS reviewed and negative.     Physical Exam/Data:  Vital signs: Temperature 98.6 F, pulse 78, respirations 18, blood pressure 169/87, oxygen saturation 99% Last 3 Weights 12/11/2020 12/10/2020 12/10/2020  Weight (lbs) 193 lb 2 oz 187 lb 13.3 oz 194 lb  Weight (kg) 87.6 kg 85.2 kg 87.998 kg      General:  Well nourished, well developed, in no acute distress HEENT: normal Lymph: no adenopathy Neck: no JVD Endocrine:  No thryomegaly Vascular: No carotid bruits; FA pulses 2+ bilaterally without bruits  Cardiac:  normal S1, S2; RRR; no murmurs, rubs, or gallops Lungs:  clear to auscultation bilaterally, no wheezing, rhonchi or rales  Abd: soft, nontender, no hepatomegaly  Ext: no lower extremity edema Musculoskeletal:  No deformities, BUE and BLE strength normal and equal Skin: warm and dry  Neuro:  CNs 2-12 intact, no focal abnormalities noted Psych:  Normal affect    EKG:  The ECG that was done yesterday at 2:46 PM was personally reviewed and demonstrates normal sinus rhythm with left anterior fascicular block.  Relevant CV Studies: LHC (12/11/2020): 1. Severe three-vessel coronary artery disease with heavy calcification, including 40% mid LMCA stenosis, sequential 40% mid and 70-80% mid/distal LAD lesions, 50% proximal LCx disease at  takeoff of large OM branch followed by sequential 70% and 99% stenoses in OM1, and chronic total occlusion of dominant RCA with left to right and right to right collaterals. 2. Mildly elevated left ventricular filling pressure.  Diagnostic Dominance: Right     Laboratory Data:  High Sensitivity Troponin:   Recent Labs  Lab 12/10/20 1239 12/10/20 1735 12/10/20 2002 12/11/20 0226  TROPONINIHS 91* 260* 329* 253*      Chemistry Recent Labs  Lab 12/10/20 1239 12/11/20 0226  NA 136 135  K 3.6 4.2  CL 99 104  CO2 21* 23  GLUCOSE 147* 86  BUN 15 11  CREATININE 1.04* 0.73  CALCIUM 10.2 8.4*  GFRNONAA 57* >60  ANIONGAP 16* 8    Recent Labs  Lab 12/10/20 1239  PROT 8.8*  ALBUMIN 5.4*  AST 38  ALT 30  ALKPHOS 56  BILITOT  0.7   Hematology Recent Labs  Lab 12/10/20 1239 12/11/20 0226  WBC 17.8* 8.3  RBC 4.58 3.51*  HGB 14.0 10.8*  HCT 40.8 31.2*  MCV 89.1 88.9  MCH 30.6 30.8  MCHC 34.3 34.6  RDW 12.7 12.9  PLT 403* 294   BNPNo results for input(s): BNP, PROBNP in the last 168 hours.  DDimer No results for input(s): DDIMER in the last 168 hours.   Radiology/Studies:  CARDIAC CATHETERIZATION  Result Date: 12/11/2020 Conclusions: 1. Severe three-vessel coronary artery disease with heavy calcification, including 40% mid LMCA stenosis, sequential 40% mid and 70-80% mid/distal LAD lesions, 50% proximal LCx disease at takeoff of large OM branch followed by sequential 70% and 99% stenoses in OM1, and chronic total occlusion of dominant RCA with left to right and right to right collaterals. 2. Mildly elevated left ventricular filling pressure. Recommendations: 1. Images reviewed with Drs. Gollan and Arida.  Given three-vessel coronary artery disease including subtotal/total occlusions of OM1 and RCA, heavy calcification throughout the coronary arteries, and history of diabetes mellitus, transfer to [facility] for surgical consultation for CABG is recommended. 2. Start IV  heparin 2 hours after TR band removal. 3. Obtain echocardiogram. 4. Aggressive secondary prevention. [name], MD [facility]   NM Myocar Multi W/Spect Izetta Dakin Motion / EF  Result Date: 12/11/2020  Abnormal pharmacologic myocardial perfusion stress test.  There is a moderate in size, severe, reversible defect involving the basal and mid anterolateral and inferolateral segments consistent with ischemia.  There is a small in size, severe, fixed mid anteroseptal and apical septal defect consistent with scar.  The left ventricular ejection fraction is mildly decreased (48%).  Attenuation correction CT demonstrates coronary artery calcification and aortic atherosclerosis.  This is a high risk study.     Assessment and Plan:   NSTEMI: Patient's presentation is not consistent with myocardial ischemia though she was noted to have mildly elevated troponin in the setting of nausea, vomiting, and diarrhea.  Stress test today was high risk with subsequent catheterization demonstrating multivessel coronary artery disease.  She is currently asymptomatic.  Cardiac surgery consultation for CABG in the setting of severe three-vessel coronary artery disease with heavy calcification of the coronary arteries as well as history of diabetes mellitus.  Initiate IV heparin 2 hours after TR band has been removed.  Continue aspirin 81 mg daily; defer addition of P2Y12 inhibitor pending cardiac surgery consultation.  Obtain transthoracic echocardiogram.  Type 2 diabetes mellitus:  Sliding scale insulin.  Check hemoglobin A1c.  Hyperlipidemia:  Check lipid panel with a.m. labs.  Continue ezetimibe and rosuvastatin for target LDL less than 70.  Nausea, vomiting, diarrhea: Etiology uncertain, though patient believes this may have been related to a vitamin she took yesterday.  Symptoms have now resolved.  Defer further work-up unless symptoms recur.  Anemia: Mild drop in hemoglobin noted during  admission, potentially hemodilution from hydration.  Repeat CBC with morning labs.  Hypertension:  Restart lisinopril 40 mg daily.  Continue clonidine 0.1 mg twice daily for now, though hopefully she can be transitioned to an alternative agent during this admission.  Risk Assessment/Risk Scores:    TIMI Risk Score for Unstable Angina or Non-ST Elevation MI:   The patient's TIMI risk score is 5, which indicates a 26% risk of all cause mortality, new or recurrent myocardial infarction or need for urgent revascularization in the next 14 days.  Severity of Illness: The appropriate patient status for this patient is INPATIENT. Inpatient status is  judged to be reasonable and necessary in order to provide the required intensity of service to ensure the patient's safety. The patient's presenting symptoms, physical exam findings, and initial radiographic and laboratory data in the context of their chronic comorbidities is felt to place them at high risk for further clinical deterioration. Furthermore, it is not anticipated that the patient will be medically stable for discharge from the hospital within 2 midnights of admission. The following factors support the patient status of inpatient.   " The patient's presenting symptoms include near syncope with nausea, vomiting, and diarrhea. " The initial radiographic and laboratory data are worrisome because of elevated high-sensitivity troponin I, high risk myocardial perfusion stress test, and cardiac catheterization demonstrating three-vessel coronary artery disease.. " The chronic co-morbidities include hypertension, hyperlipidemia, and diabetes mellitus.   * I certify that at the point of admission it is my clinical judgment that the patient will require inpatient hospital care spanning beyond 2 midnights from the point of admission due to high intensity of service, high risk for further deterioration and high frequency of surveillance required.*     For questions or updates, please contact [facility] Please consult [email or web address] for contact info under     Signed, [name], MD  12/11/2020 9:12 PM

## 2020-12-12 ENCOUNTER — Encounter (HOSPITAL_COMMUNITY): Payer: Self-pay | Admitting: Cardiology

## 2020-12-12 ENCOUNTER — Inpatient Hospital Stay (HOSPITAL_COMMUNITY): Payer: Medicare HMO

## 2020-12-12 ENCOUNTER — Other Ambulatory Visit: Payer: Self-pay

## 2020-12-12 DIAGNOSIS — I2510 Atherosclerotic heart disease of native coronary artery without angina pectoris: Secondary | ICD-10-CM

## 2020-12-12 DIAGNOSIS — E785 Hyperlipidemia, unspecified: Secondary | ICD-10-CM

## 2020-12-12 DIAGNOSIS — Z01810 Encounter for preprocedural cardiovascular examination: Secondary | ICD-10-CM | POA: Diagnosis not present

## 2020-12-12 DIAGNOSIS — E119 Type 2 diabetes mellitus without complications: Secondary | ICD-10-CM | POA: Diagnosis not present

## 2020-12-12 DIAGNOSIS — I25110 Atherosclerotic heart disease of native coronary artery with unstable angina pectoris: Secondary | ICD-10-CM

## 2020-12-12 DIAGNOSIS — I10 Essential (primary) hypertension: Secondary | ICD-10-CM

## 2020-12-12 DIAGNOSIS — I214 Non-ST elevation (NSTEMI) myocardial infarction: Secondary | ICD-10-CM

## 2020-12-12 DIAGNOSIS — E1169 Type 2 diabetes mellitus with other specified complication: Secondary | ICD-10-CM

## 2020-12-12 LAB — BASIC METABOLIC PANEL
Anion gap: 9 (ref 5–15)
BUN: 5 mg/dL — ABNORMAL LOW (ref 8–23)
CO2: 23 mmol/L (ref 22–32)
Calcium: 9.1 mg/dL (ref 8.9–10.3)
Chloride: 104 mmol/L (ref 98–111)
Creatinine, Ser: 0.62 mg/dL (ref 0.44–1.00)
GFR, Estimated: >60 mL/min (ref >60)
Glucose, Bld: 126 mg/dL — ABNORMAL HIGH (ref 70–99)
Potassium: 3.7 mmol/L (ref 3.5–5.1)
Sodium: 136 mmol/L (ref 135–145)

## 2020-12-12 LAB — ABO/RH: ABO/RH(D): A POS

## 2020-12-12 LAB — CBC
HCT: 34.9 % — ABNORMAL LOW (ref 36.0–46.0)
Hemoglobin: 11.8 g/dL — ABNORMAL LOW (ref 12.0–15.0)
MCH: 30.5 pg (ref 26.0–34.0)
MCHC: 33.8 g/dL (ref 30.0–36.0)
MCV: 90.2 fL (ref 80.0–100.0)
Platelets: 280 10*3/uL (ref 150–400)
RBC: 3.87 MIL/uL (ref 3.87–5.11)
RDW: 13 % (ref 11.5–15.5)
WBC: 10.5 10*3/uL (ref 4.0–10.5)
nRBC: 0 % (ref 0.0–0.2)

## 2020-12-12 LAB — BLOOD GAS, ARTERIAL
Acid-base deficit: 0.4 mmol/L (ref 0.0–2.0)
Bicarbonate: 23.2 mmol/L (ref 20.0–28.0)
FIO2: 21
O2 Saturation: 97.8 %
Patient temperature: 37
pCO2 arterial: 34.5 mmHg (ref 32.0–48.0)
pH, Arterial: 7.442 (ref 7.350–7.450)
pO2, Arterial: 99.6 mmHg (ref 83.0–108.0)

## 2020-12-12 LAB — HEPARIN LEVEL (UNFRACTIONATED)
Heparin Unfractionated: 0.24 IU/mL — ABNORMAL LOW (ref 0.30–0.70)
Heparin Unfractionated: 0.43 IU/mL (ref 0.30–0.70)

## 2020-12-12 LAB — PROTIME-INR
INR: 1 (ref 0.8–1.2)
Prothrombin Time: 12.9 seconds (ref 11.4–15.2)

## 2020-12-12 LAB — LIPID PANEL
Cholesterol: 118 mg/dL (ref 0–200)
HDL: 61 mg/dL (ref >40)
LDL Cholesterol: 31 mg/dL (ref 0–99)
Total CHOL/HDL Ratio: 1.9 RATIO
Triglycerides: 130 mg/dL (ref <150)
VLDL: 26 mg/dL (ref 0–40)

## 2020-12-12 LAB — GLUCOSE, CAPILLARY
Glucose-Capillary: 168 mg/dL — ABNORMAL HIGH (ref 70–99)
Glucose-Capillary: 106 mg/dL — ABNORMAL HIGH (ref 70–99)
Glucose-Capillary: 127 mg/dL — ABNORMAL HIGH (ref 70–99)
Glucose-Capillary: 122 mg/dL — ABNORMAL HIGH (ref 70–99)

## 2020-12-12 LAB — PREPARE RBC (CROSSMATCH)

## 2020-12-12 MED ORDER — NITROGLYCERIN 0.4 MG SL SUBL: 0.4000 mg | SUBLINGUAL_TABLET | SUBLINGUAL | Status: DC | PRN

## 2020-12-12 MED ORDER — BISACODYL 5 MG PO TBEC: 5.0000 mg | DELAYED_RELEASE_TABLET | Freq: Once | ORAL | Status: DC

## 2020-12-12 MED ORDER — SODIUM CHLORIDE 0.9 % IV SOLN
INTRAVENOUS | Status: DC
  Filled 2020-12-12 (×2): qty 30

## 2020-12-12 MED ORDER — PHENYLEPHRINE HCL-NACL 20-0.9 MG/250ML-% IV SOLN
30.0000 ug/min | INTRAVENOUS | Status: AC
  Administered 2020-12-13: 15 ug/min via INTRAVENOUS
  Filled 2020-12-12: qty 250

## 2020-12-12 MED ORDER — EPINEPHRINE HCL 5 MG/250ML IV SOLN IN NS
0.0000 ug/min | INTRAVENOUS | Status: DC
  Filled 2020-12-12 (×2): qty 250

## 2020-12-12 MED ORDER — METOPROLOL SUCCINATE ER 25 MG PO TB24
25.0000 mg | ORAL_TABLET | Freq: Every day | ORAL | Status: DC
  Administered 2020-12-12 – 2020-12-13 (×2): 25 mg via ORAL
  Filled 2020-12-12 (×2): qty 1

## 2020-12-12 MED ORDER — CEFAZOLIN SODIUM-DEXTROSE 2-4 GM/100ML-% IV SOLN
2.0000 g | INTRAVENOUS | Status: DC
  Filled 2020-12-12 (×2): qty 100

## 2020-12-12 MED ORDER — CHLORHEXIDINE GLUCONATE CLOTH 2 % EX PADS: 6.0000 | MEDICATED_PAD | Freq: Once | CUTANEOUS | Status: AC

## 2020-12-12 MED ORDER — PLASMA-LYTE 148 IV SOLN
INTRAVENOUS | Status: DC
  Filled 2020-12-12 (×2): qty 2.5

## 2020-12-12 MED ORDER — ALBUTEROL SULFATE (2.5 MG/3ML) 0.083% IN NEBU: 2.5000 mg | INHALATION_SOLUTION | Freq: Four times a day (QID) | RESPIRATORY_TRACT | Status: DC | PRN

## 2020-12-12 MED ORDER — POTASSIUM CHLORIDE 2 MEQ/ML IV SOLN
80.0000 meq | INTRAVENOUS | Status: DC
  Filled 2020-12-12: qty 40

## 2020-12-12 MED ORDER — HYDRALAZINE HCL 20 MG/ML IJ SOLN
10.0000 mg | Freq: Four times a day (QID) | INTRAMUSCULAR | Status: DC | PRN
  Administered 2020-12-13: 10 mg via INTRAVENOUS
  Filled 2020-12-12: qty 1

## 2020-12-12 MED ORDER — CHLORHEXIDINE GLUCONATE 0.12 % MT SOLN
15.0000 mL | Freq: Once | OROMUCOSAL | Status: AC
  Administered 2020-12-13: 15 mL via OROMUCOSAL
  Filled 2020-12-12: qty 15

## 2020-12-12 MED ORDER — CHLORHEXIDINE GLUCONATE CLOTH 2 % EX PADS
6.0000 | MEDICATED_PAD | Freq: Once | CUTANEOUS | Status: AC
  Administered 2020-12-13: 6 via TOPICAL

## 2020-12-12 MED ORDER — CHLORHEXIDINE GLUCONATE CLOTH 2 % EX PADS
6.0000 | MEDICATED_PAD | Freq: Once | CUTANEOUS | Status: AC
  Administered 2020-12-12: 6 via TOPICAL

## 2020-12-12 MED ORDER — METOPROLOL TARTRATE 12.5 MG HALF TABLET
12.5000 mg | ORAL_TABLET | Freq: Once | ORAL | Status: AC
  Administered 2020-12-13: 12.5 mg via ORAL
  Filled 2020-12-12: qty 1

## 2020-12-12 MED ORDER — CLONIDINE HCL 0.1 MG PO TABS
0.1000 mg | ORAL_TABLET | Freq: Two times a day (BID) | ORAL | Status: DC
  Administered 2020-12-12: 0.1 mg via ORAL
  Filled 2020-12-12: qty 1

## 2020-12-12 MED ORDER — MANNITOL 20 % IV SOLN
Freq: Once | INTRAVENOUS | Status: DC
  Filled 2020-12-12 (×2): qty 13

## 2020-12-12 MED ORDER — INSULIN REGULAR(HUMAN) IN NACL 100-0.9 UT/100ML-% IV SOLN
INTRAVENOUS | Status: AC
  Administered 2020-12-13: 2.4 [IU]/h via INTRAVENOUS
  Filled 2020-12-12: qty 100

## 2020-12-12 MED ORDER — ASPIRIN EC 81 MG PO TBEC
81.0000 mg | DELAYED_RELEASE_TABLET | Freq: Every day | ORAL | Status: DC
  Administered 2020-12-12: 81 mg via ORAL
  Filled 2020-12-12: qty 1

## 2020-12-12 MED ORDER — LISINOPRIL 20 MG PO TABS: 40.0000 mg | ORAL_TABLET | Freq: Every day | ORAL | Status: DC

## 2020-12-12 MED ORDER — MILRINONE LACTATE IN DEXTROSE 20-5 MG/100ML-% IV SOLN
0.3000 ug/kg/min | INTRAVENOUS | Status: DC
  Filled 2020-12-12: qty 100

## 2020-12-12 MED ORDER — ROSUVASTATIN CALCIUM 20 MG PO TABS
40.0000 mg | ORAL_TABLET | Freq: Every day | ORAL | Status: DC
  Administered 2020-12-12 – 2020-12-18 (×6): 40 mg via ORAL
  Filled 2020-12-12 (×7): qty 2

## 2020-12-12 MED ORDER — TRANEXAMIC ACID 1000 MG/10ML IV SOLN
1.5000 mg/kg/h | INTRAVENOUS | Status: AC
  Administered 2020-12-13: 1.5 mg/kg/h via INTRAVENOUS
  Filled 2020-12-12 (×2): qty 25

## 2020-12-12 MED ORDER — NITROGLYCERIN IN D5W 200-5 MCG/ML-% IV SOLN
2.0000 ug/min | INTRAVENOUS | Status: AC
  Administered 2020-12-13: 5 ug/min via INTRAVENOUS
  Filled 2020-12-12: qty 250

## 2020-12-12 MED ORDER — TEMAZEPAM 15 MG PO CAPS
15.0000 mg | ORAL_CAPSULE | Freq: Once | ORAL | Status: AC | PRN
  Administered 2020-12-12: 15 mg via ORAL
  Filled 2020-12-12: qty 1

## 2020-12-12 MED ORDER — LIP MEDEX EX OINT
TOPICAL_OINTMENT | CUTANEOUS | Status: DC | PRN
  Filled 2020-12-12: qty 7

## 2020-12-12 MED ORDER — DEXMEDETOMIDINE HCL IN NACL 400 MCG/100ML IV SOLN
0.1000 ug/kg/h | INTRAVENOUS | Status: AC
  Administered 2020-12-13: .5 ug/kg/h via INTRAVENOUS
  Filled 2020-12-12 (×2): qty 100

## 2020-12-12 MED ORDER — CLONIDINE HCL 0.1 MG PO TABS: 0.1000 mg | ORAL_TABLET | Freq: Every day | ORAL | Status: DC

## 2020-12-12 MED ORDER — TRANEXAMIC ACID (OHS) PUMP PRIME SOLUTION
2.0000 mg/kg | INTRAVENOUS | Status: DC
  Filled 2020-12-12: qty 1.73

## 2020-12-12 MED ORDER — ONDANSETRON HCL 4 MG/2ML IJ SOLN: 4.0000 mg | Freq: Four times a day (QID) | INTRAMUSCULAR | Status: DC | PRN

## 2020-12-12 MED ORDER — PANTOPRAZOLE SODIUM 40 MG PO TBEC
40.0000 mg | DELAYED_RELEASE_TABLET | Freq: Every day | ORAL | Status: DC
  Administered 2020-12-12: 40 mg via ORAL
  Filled 2020-12-12: qty 1

## 2020-12-12 MED ORDER — EZETIMIBE 10 MG PO TABS
10.0000 mg | ORAL_TABLET | Freq: Every day | ORAL | Status: DC
  Administered 2020-12-12: 10 mg via ORAL
  Filled 2020-12-12: qty 1

## 2020-12-12 MED ORDER — VANCOMYCIN HCL 1250 MG/250ML IV SOLN
1250.0000 mg | INTRAVENOUS | Status: AC
  Administered 2020-12-13: 1250 mg via INTRAVENOUS
  Filled 2020-12-12: qty 250

## 2020-12-12 MED ORDER — NOREPINEPHRINE 4 MG/250ML-% IV SOLN
0.0000 ug/min | INTRAVENOUS | Status: DC
  Filled 2020-12-12: qty 250

## 2020-12-12 MED ORDER — ACETAMINOPHEN 325 MG PO TABS
650.0000 mg | ORAL_TABLET | ORAL | Status: DC | PRN
  Administered 2020-12-12: 650 mg via ORAL
  Filled 2020-12-12: qty 2

## 2020-12-12 MED ORDER — TRANEXAMIC ACID (OHS) BOLUS VIA INFUSION
15.0000 mg/kg | INTRAVENOUS | Status: AC
  Administered 2020-12-13: 1297.5 mg via INTRAVENOUS
  Filled 2020-12-12: qty 1298

## 2020-12-12 MED ORDER — CEFAZOLIN SODIUM-DEXTROSE 2-4 GM/100ML-% IV SOLN
2.0000 g | INTRAVENOUS | Status: AC
  Administered 2020-12-13 (×2): 2 g via INTRAVENOUS
  Filled 2020-12-12 (×2): qty 100

## 2020-12-12 NOTE — Progress Notes (Addendum)
Progress Note  Patient Name: Erica Brock Date of Encounter: 12/12/2020  [facility] Cardiologist: Julien Nordmann, MD   Subjective   No chest pain overnight.   Inpatient Medications    Scheduled Meds: . aspirin EC  81 mg Oral Daily  . cloNIDine  0.1 mg Oral BID  . ezetimibe  10 mg Oral Daily  . insulin aspart  0-15 Units Subcutaneous TID WC  . insulin aspart  0-5 Units Subcutaneous QHS  . [START ON 12/13/2020] lisinopril  40 mg Oral Daily  . pantoprazole  40 mg Oral Daily   Continuous Infusions: . heparin     PRN Meds: acetaminophen, albuterol, hydrALAZINE, nitroGLYCERIN, ondansetron (ZOFRAN) IV   Vital Signs    Vitals:   12/11/20 2132 12/11/20 2315 12/12/20 0451 12/12/20 0849  BP: (!) 171/83 (!) 169/86 (!) 167/80 (!) 165/83  Pulse: 78 88 82 80  Resp:  Temp: 98.8 F (37.1 C) 98.4 F (36.9 C) 98.7 F (37.1 C) 98.8 F (37.1 C)  TempSrc: Oral Oral Oral Oral  SpO2:  98%    Weight:      Height:        Intake/Output Summary (Last 24 hours) at 12/12/2020 0940 Last data filed at 12/12/2020 0651 Gross per 24 hour  Intake --  Output 300 ml  Net -300 ml   Last 3 Weights 12/11/2020 12/11/2020 12/10/2020  Weight (lbs) 190 lb 9.6 oz 193 lb 2 oz 187 lb 13.3 oz  Weight (kg) 86.456 kg 87.6 kg 85.2 kg      Telemetry    SR --> 80s - Personally Reviewed  ECG    SR with incomplete RBBB - Personally Reviewed  Physical Exam   GEN: No acute distress.   Neck: No JVD Cardiac: RRR, + soft systolic murmur, no rubs, or gallops.  Respiratory: Clear to auscultation bilaterally. GI: Soft, nontender, non-distended  MS: No edema; No deformity. Right radial cath site stable.  Neuro:  Nonfocal  Psych: Normal affect   Labs    High Sensitivity Troponin:   Recent Labs  Lab 12/10/20 1239 12/10/20 1735 12/10/20 2002 12/11/20 0226  TROPONINIHS 91* 260* 329* 253*      Chemistry Recent Labs  Lab 12/10/20 1239 12/11/20 0226  NA 136 135  K 3.6 4.2  CL 99  104  CO2 21* 23  GLUCOSE 147* 86  BUN 15 11  CREATININE 1.04* 0.73  CALCIUM 10.2 8.4*  PROT 8.8*  --   ALBUMIN 5.4*  --   AST 38  --   ALT 30  --   ALKPHOS 56  --   BILITOT 0.7  --   GFRNONAA 57* >60  ANIONGAP 16* 8     Hematology Recent Labs  Lab 12/10/20 1239 12/11/20 0226  WBC 17.8* 8.3  RBC 4.58 3.51*  HGB 14.0 10.8*  HCT 40.8 31.2*  MCV 89.1 88.9  MCH 30.6 30.8  MCHC 34.3 34.6  RDW 12.7 12.9  PLT 403* 294    BNPNo results for input(s): BNP, PROBNP in the last 168 hours.   DDimer No results for input(s): DDIMER in the last 168 hours.   Radiology    CARDIAC CATHETERIZATION  Result Date: 12/11/2020 Conclusions: 1. Severe three-vessel coronary artery disease with heavy calcification, including 40% mid LMCA stenosis, sequential 40% mid and 70-80% mid/distal LAD lesions, 50% proximal LCx disease at takeoff of large OM branch followed by sequential 70% and 99% stenoses in OM1, and chronic total occlusion of  dominant RCA with left to right and right to right collaterals. 2. Mildly elevated left ventricular filling pressure. Recommendations: 1. Images reviewed with Drs. Gollan and Arida.  Given three-vessel coronary artery disease including subtotal/total occlusions of OM1 and RCA, heavy calcification throughout the coronary arteries, and history of diabetes mellitus, transfer to [facility] for surgical consultation for CABG is recommended. 2. Start IV heparin 2 hours after TR band removal. 3. Obtain echocardiogram. 4. Aggressive secondary prevention. [name], MD [facility]   NM Myocar Multi W/Spect Izetta Dakin Motion / EF  Result Date: 12/11/2020  Abnormal pharmacologic myocardial perfusion stress test.  There is a moderate in size, severe, reversible defect involving the basal and mid anterolateral and inferolateral segments consistent with ischemia.  There is a small in size, severe, fixed mid anteroseptal and apical septal defect consistent with scar.  The left  ventricular ejection fraction is mildly decreased (48%).  Attenuation correction CT demonstrates coronary artery calcification and aortic atherosclerosis.  This is a high risk study.     Cardiac Studies   Cath: 12/11/20  Conclusions: 1. Severe three-vessel coronary artery disease with heavy calcification, including 40% mid LMCA stenosis, sequential 40% mid and 70-80% mid/distal LAD lesions, 50% proximal LCx disease at takeoff of large OM branch followed by sequential 70% and 99% stenoses in OM1, and chronic total occlusion of dominant RCA with left to right and right to right collaterals. 2. Mildly elevated left ventricular filling pressure.  Recommendations: 1. Images reviewed with Drs. Gollan and Arida.  Given three-vessel coronary artery disease including subtotal/total occlusions of OM1 and RCA, heavy calcification throughout the coronary arteries, and history of diabetes mellitus, transfer to [facility] for surgical consultation for CABG is recommended. 2. Start IV heparin 2 hours after TR band removal. 3. Obtain echocardiogram. 4. Aggressive secondary prevention.  [name], MD [facility]  Diagnostic Dominance: Right   Echo: 09/19/20  IMPRESSIONS    1. Left ventricular ejection fraction, by estimation, is 55 to 60%. Left  ventricular ejection fraction by 3D volume is 59 %. The left ventricle has  normal function. The left ventricle has no regional wall motion  abnormalities. There is mild left  ventricular hypertrophy. Left ventricular diastolic parameters are  indeterminate. The average left ventricular global longitudinal strain is  -13.6 %. The global longitudinal strain is abnormal.  2. Right ventricular systolic function is normal. The right ventricular  size is normal.  3. The mitral valve is grossly normal. Mild mitral valve regurgitation.  4. The aortic valve is tricuspid. Aortic valve regurgitation is trivial.  Mild aortic valve sclerosis is  present, with no evidence of aortic valve  stenosis.  5. The inferior vena cava is normal in size with <50% respiratory  variability, suggesting right atrial pressure of 8 mmHg.   Patient Profile     70 y.o. female with multivessel coronary artery disease diagnosed by catheterization 5/24, aortic atherosclerosis, hypertension, hyperlipidemia, type 2 diabetes mellitus, pulmonary embolism in the setting of surgery in 2018, obesity, rheumatoid arthritis, and osteoarthritis who is being seen 12/11/2020 for the evaluation of coronary artery disease.   Assessment & Plan    1. NSTEMI: hsTn peaked at 329, underwent cardiac catheterization on 5/24 noting multivessel CAD.  She has been continued on IV heparin and transferred to [facility] for T CTS consultation for CABG. -- Continue aspirin 81 mg daily, Crestor 40 mg daily, Zetia 10 mg daily, along with ACE inhibitor and beta-blocker  2. DM: Hemoglobin A1c 6.3 --  Continue SSI  3. HLD: On Crestor 40 mg daily and Zetia 10 mg daily -- Lipids pending  4. HTN: Clonidine 0.1 mg BID.  Would favor transitioning over to beta-blocker. -- We will wean clonidine over the next 3 days with plans to transition to Toprol XL 25 mg -- Continue lisinopril 40 mg daily  5. Anemia: Hemoglobin noted at 10.8 yesterday, suspect dilutional. CBC pending this morning --follow CBC  6. N/V/D: Resolved, stable electrolytes  For questions or updates, please contact [facility] Please consult [email or web address] for contact info under        Signed, Laverda Page, NP  12/12/2020, 9:40 AM    ATTENDING ATTESTATION  I have seen, examined and evaluated the patient this PM along with Laverda Page, NP-C.  After reviewing all the available data and chart, we discussed the patients laboratory, study & physical findings as well as symptoms in detail. I agree with her findings, examination as well as impression recommendations as per our discussion.    Attending adjustments  noted in italics.   Relatively stable following her catheterization.  Currently on IV heparin.  Being evaluated by cardiac surgery for planned CABG. Agree with converting to beta-blocker from clonidine.  Hopefully this can be done prior to her CABG. On rosuvastatin.  Major decision point is going to be the timing of when her CABG will be.  If she is likely to be scheduled for this week, would make sense to stay in the hospital, however, would likely be safe to be discharged home over the weekend if a prolonged wait is required for scheduling.  Prior to doing that, we would need to see how she does off of IV heparin.      Bryan Lemma, M.D., M.S. Interventional Cardiologist   Pager # [phone] Phone # [phone] [address] [place], [place] [address]

## 2020-12-12 NOTE — Progress Notes (Signed)
RT came to patients room for ABG.  Patient unavailable at this time.  Will try again.

## 2020-12-12 NOTE — Progress Notes (Signed)
ANTICOAGULATION CONSULT NOTE  Pharmacy Consult for IV Heparin Indication: chest pain/ACS  Allergies  Allergen Reactions  . Prednisone Other (See Comments)    Elevated BP  . Atorvastatin     Muscle aches     Patient Measurements: Total Body Weight: 87.6 kg Height:  64 inches Heparin Dosing Weight: 74.3 kg  Vital Signs: Temp: 98.8 F (37.1 C) (05/25 0849) Temp Source: Oral (05/25 0849) BP: 165/83 (05/25 0849) Pulse Rate: 80 (05/25 0849)  Labs: Recent Labs    12/10/20 1239 12/10/20 1735 12/10/20 2002 12/11/20 0226 12/12/20 0947  HGB 14.0  --   --  10.8* 11.8*  HCT 40.8  --   --  31.2* 34.9*  PLT 403*  --   --  294 280  LABPROT  --   --   --   --  12.9  INR  --   --   --   --  1.0  HEPARINUNFRC  --   --   --   --  0.24*  CREATININE 1.04*  --   --  0.73 0.62  TROPONINIHS 91* 260* 329* 253*  --    Estimated Creatinine Clearance: 68.6 mL/min (by C-G formula based on SCr of 0.62 mg/dL).  Medical History: Past Medical History:  Diagnosis Date  . Anxiety   . Arthritis   . Diabetes mellitus without complication (HCC)   . Diffuse cystic mastopathy 2012  . GERD (gastroesophageal reflux disease)   . Heart murmur    ETT myoview 12/09 (Dr. Juel Burrow) no evidence of ischemia, good exercise tolerance - 9 min. echo (11/10): EF 60-65%, nml RV, PASP 25, nml valves  . Hepatitis    as a child   . HLD (hyperlipidemia)   . HTN (hypertension)    for < 10 years  . Obesity   . OSA (obstructive sleep apnea)    mild; uses CPAP  . Rheumatic fever    as a child   Assessment: 70 yr old woman transferred from [facility] to [facility] for CABG evaluation.Underwent cardiac cath at [facility] 5/24, which revealed severe 3-vessel CAD with heavy calcification.   Pharmacy consulted for IV heparin for CP/ACS 2 hrs after TR band removed (per Waldron Session, [facility] RN, TR band was completely deflated at 1820 PM 5/24). Pt was not on anticoagulation PTA to [facility]. At [facility], she was on Lovenox 40  mg/day, with last dose on 5/23 at 2114 PM.  Heparin level sub-therapeutic this morning (5/25).  Goal of Therapy:  Heparin level 0.3-0.7 units/ml Monitor platelets by anticoagulation protocol: Yes   Plan:  Increase heparin rate to 1100 units/hr at 1130 AM 5/25 Check heparin level in ~7 hrs (5/25 @ 1930) Monitor daily heparin level, CBC Monitor for bleeding  Delmar Landau, PharmD, BCPS Clinical Pharmacist 12/12/2020 11:07 AM

## 2020-12-12 NOTE — Progress Notes (Signed)
ANTICOAGULATION CONSULT NOTE  Pharmacy Consult for IV Heparin Indication: chest pain/ACS  Allergies  Allergen Reactions  . Prednisone Other (See Comments)    Elevated BP  . Atorvastatin     Muscle aches     Patient Measurements: Total Body Weight: 87.6 kg Height:  64 inches Heparin Dosing Weight: 74.3 kg  Vital Signs: Temp: 98.8 F (37.1 C) (05/25 0849) Temp Source: Oral (05/25 0849) BP: 146/87 (05/25 1142) Pulse Rate: 75 (05/25 1142)  Labs: Recent Labs    12/10/20 1239 12/10/20 1735 12/10/20 2002 12/11/20 0226 12/12/20 0947 12/12/20 1843  HGB 14.0  --   --  10.8* 11.8*  --   HCT 40.8  --   --  31.2* 34.9*  --   PLT 403*  --   --  294 280  --   LABPROT  --   --   --   --  12.9  --   INR  --   --   --   --  1.0  --   HEPARINUNFRC  --   --   --   --  0.24* 0.43  CREATININE 1.04*  --   --  0.73 0.62  --   TROPONINIHS 91* 260* 329* 253*  --   --    Estimated Creatinine Clearance: 68.6 mL/min (by C-G formula based on SCr of 0.62 mg/dL).  Assessment: 70 yr old woman transferred from [facility] to [facility] for CABG evaluation.  Underwent cardiac cath at [facility] 5/24, which revealed severe 3-vessel CAD with heavy calcification.  IV heparin resumed post cath.  Heparin level is therapeutic; no bleeding reported.  Goal of Therapy:  Heparin level 0.3-0.7 units/ml Monitor platelets by anticoagulation protocol: Yes   Plan:  Continue heparin gtt at 1100 units/hr F/u AM labs vs CABG  Kaisley Stiverson D. Laney Potash, PharmD, BCPS, BCCCP 12/12/2020, 8:03 PM

## 2020-12-12 NOTE — Consult Note (Addendum)
[address]       [place] [address]             [phone]        Erica Brock [facility] Health Medical Record #[id number] Date of Birth: [birth year]  Referring: No ref. provider found Primary Care: Leim Fabry, MD Primary Cardiologist:Timothy Mariah Milling, MD  Chief Complaint:  Nausea and vomiting  History of Present Illness: The patient is a 70 year old female with multiple cardiac risk factors including hypertension, hyperlipidemia, sleep apnea, obesity diabetes mellitus type 2 as well as history of of previous DVT/PE we are asked to see in consideration of coronary artery surgical revascularization.  The patient presented to the emergency department on 523.  She awoke in her usual state of health in the morning, took her multivitamin and shortly after developed an intense nausea followed by vomiting and diarrhea.  After this episode the patient was noted to look lethargic and diaphoretic.  She notes that she also felt lightheaded.  Her husband called EMS and she was brought to the emergency department.  In the emergency department basic laboratory screens were done as well as cardiac high-sensitivity troponin which was initially elevated at 91.  It subsequently peaked at 329.  She denied chest pain or shortness of breath.  EKG was noted to have no ST-T wave changes.  Due to these findings the patient was admitted for further evaluation treatment to include cardiology consultation.  A stress test was performed and noted to be high risk.  During the test she also experienced some typical anginal symptoms.  She was felt to require further diagnostic evaluation including cardiac catheterization which was done on 12/11/2020.  Findings were notable for severe three-vessel coronary artery disease including a 40% left main coronary artery stenosis, sequential 40% mid and 70 to 80% mid/distal LAD lesions, 50% proximal left circumflex disease at the takeoff of a large OM branch followed  by sequential 70 and 99% stenosis in the OM1.  She was noted to have a CTO of the dominant RCA with left-to-right and right to right collateralization.  Please see the full report below.  She does have a history of an echocardiogram done in March of this year showing ejection fraction at that time by estimation 55 to 60%.  There was mild mitral regurgitation noted and trivial/ mild aortic regurgitation.    Current Activity/ Functional Status: Patient is independent with mobility/ambulation, transfers, ADL's, IADL's.   Zubrod Score: At the time of surgery this patient's most appropriate activity status/level should be described as:     0    Normal activity, no symptoms     1    Restricted in physical strenuous activity but ambulatory, able to do out light work     2    Ambulatory and capable of self care, unable to do work activities, up and about                 more than 50%  Of the time                                3    Only limited self care, in bed greater than 50% of waking hours     4    Completely disabled, no self care, confined to bed or chair     5    Moribund  Past Medical History:  Diagnosis  Date  . Anxiety   . Arthritis   . Diabetes mellitus without complication (HCC)   . Diffuse cystic mastopathy 2012  . GERD (gastroesophageal reflux disease)   . Heart murmur    ETT myoview 12/09 (Dr. Juel Burrow) no evidence of ischemia, good exercise tolerance - 9 min. echo (11/10): EF 60-65%, nml RV, PASP 25, nml valves  . Hepatitis    as a child   . HLD (hyperlipidemia)   . HTN (hypertension)    for < 10 years  . Obesity   . OSA (obstructive sleep apnea)    mild; uses CPAP  . Rheumatic fever    as a child    Past Surgical History:  Procedure Laterality Date  . arthroscopic surgery     L elbow   . BREAST BIOPSY Left    needle bx-neg  . carpael tunnel release     R hand  . COLONOSCOPY  2008   Dr. Sharen Hint  . FOOT SURGERY Left   . LEFT HEART CATH AND CORONARY  ANGIOGRAPHY N/A 12/11/2020   Procedure: LEFT HEART CATH AND CORONARY ANGIOGRAPHY;  Surgeon: [name], MD;  Location: [facility] INVASIVE CV LAB;  Service: Cardiovascular;  Laterality: N/A;  . reconstruction foot and bunion surgery - both feet    . TONSILLECTOMY    . TOTAL ABDOMINAL HYSTERECTOMY  1994    Social History   Tobacco Use  Smoking Status Never Smoker  Smokeless Tobacco Never Used  Tobacco Comment   tobacco use - no    Social History   Substance and Sexual Activity  Alcohol Use Yes   Comment: social      Allergies  Allergen Reactions  . Prednisone Other (See Comments)    Elevated BP  . Atorvastatin     Muscle aches     Current Facility-Administered Medications  Medication Dose Route Frequency Provider Last Rate Last Admin  . acetaminophen (TYLENOL) tablet 650 mg  650 mg Oral Q4H PRN Jodelle Red, MD      . albuterol (PROVENTIL) (2.5 MG/3ML) 0.083% nebulizer solution 2.5 mg  2.5 mg Nebulization Q6H PRN Jodelle Red, MD      . aspirin EC tablet 81 mg  81 mg Oral Daily Jodelle Red, MD   81 mg at 12/12/20 0932  . [START ON 12/13/2020] cloNIDine (CATAPRES) tablet 0.1 mg  0.1 mg Oral Daily Laverda Page B, NP      . ezetimibe (ZETIA) tablet 10 mg  10 mg Oral Daily Jodelle Red, MD   10 mg at 12/12/20 0931  . heparin ADULT infusion 100 units/mL (25000 units/269mL)  1,100 Units/hr Intravenous Continuous Cathie Hoops, RPH      . hydrALAZINE (APRESOLINE) injection 10 mg  10 mg Intravenous Q6H PRN Jodelle Red, MD      . insulin aspart (novoLOG) injection 0-15 Units  0-15 Units Subcutaneous TID WC End, Cristal Deer, MD   3 Units at 12/12/20 0931  . insulin aspart (novoLOG) injection 0-5 Units  0-5 Units Subcutaneous QHS End, Christopher, MD      . Melene Muller ON 12/13/2020] lisinopril (ZESTRIL) tablet 40 mg  40 mg Oral Daily Jodelle Red, MD      . metoprolol succinate (TOPROL-XL) 24 hr tablet 25 mg  25 mg Oral  Daily Laverda Page B, NP   25 mg at 12/12/20 1142  . nitroGLYCERIN (NITROSTAT) SL tablet 0.4 mg  0.4 mg Sublingual Q5 Min x 3 PRN Jodelle Red, MD      . ondansetron [facility]) injection  4 mg  4 mg Intravenous Q6H PRN Jodelle Red, MD      . pantoprazole (PROTONIX) EC tablet 40 mg  40 mg Oral Daily Jodelle Red, MD   40 mg at 12/12/20 0931  . rosuvastatin (CRESTOR) tablet 40 mg  40 mg Oral Daily Laverda Page B, NP   40 mg at 12/12/20 1143    Medications Prior to Admission  Medication Sig Dispense Refill Last Dose  . cloNIDine (CATAPRES) 0.1 MG tablet Take 1 tablet (0.1 mg total) by mouth 2 (two) times daily. 28 tablet 0   . ezetimibe (ZETIA) 10 MG tablet TAKE 1 TABLET DAILY (Patient taking differently: Take 10 mg by mouth daily.) 90 tablet 3   . hydroxychloroquine (PLAQUENIL) 200 MG tablet Take 200 mg by mouth 2 (two) times daily.     Marland Kitchen lisinopril (ZESTRIL) 40 MG tablet Take 1 tablet (40 mg total) by mouth daily. 90 tablet 1   . metFORMIN (GLUCOPHAGE-XR) 500 MG 24 hr tablet Take 500 mg by mouth at bedtime.     . montelukast (SINGULAIR) 10 MG tablet Take 10 mg by mouth daily as needed.     . pantoprazole (PROTONIX) 40 MG tablet Take 40 mg by mouth daily.       . rosuvastatin (CRESTOR) 40 MG tablet Take 1 tablet (40 mg total) by mouth daily. 90 tablet 3     Family History  Problem Relation Age of Onset  . Diabetes Father   . Hypertension Father   . Other Father        PPM  . Multiple sclerosis Mother   . Hyperlipidemia Mother   . Heart attack Sister        LATE 30'S, 40  . Aneurysm Brother        brain  . Hypertension Sister   . Hyperlipidemia Sister   . Breast cancer Neg Hx      Review of Systems:   Review of Systems  Constitutional: Positive for diaphoresis, malaise/fatigue and weight loss. Negative for chills and fever.  HENT: Positive for congestion and tinnitus. Negative for ear discharge, ear pain, hearing loss, nosebleeds, sinus pain  and sore throat.   Eyes: Negative for blurred vision, double vision, photophobia, pain, discharge and redness.  Respiratory: Negative for cough, hemoptysis, sputum production, shortness of breath, wheezing and stridor.   Cardiovascular: Positive for chest pain and leg swelling. Negative for palpitations, orthopnea, claudication and PND.  Gastrointestinal: Positive for constipation, diarrhea, heartburn, nausea and vomiting. Negative for abdominal pain, blood in stool and melena.  Genitourinary: Positive for frequency. Negative for dysuria, flank pain, hematuria and urgency.  Musculoskeletal: Positive for back pain and joint pain. Negative for falls, myalgias and neck pain.       Has osteo and rhematoid arthitis, + scoliosis  Skin: Negative.   Neurological: Positive for dizziness, sensory change, weakness and headaches. Negative for tingling, tremors, speech change, focal weakness, seizures and loss of consciousness.       Some decreased feeling in feet  Endo/Heme/Allergies: Positive for environmental allergies and polydipsia. Does not bruise/bleed easily.       Anemic- no specific source, neg colon work-up  Psychiatric/Behavioral: Positive for memory loss. Negative for depression, hallucinations, substance abuse and suicidal ideas. The patient has insomnia. The patient is not nervous/anxious.        Memory loss of acute event but memory is normally fine      Physical Exam: BP (!) 146/87   Pulse 75   Temp 98.8  F (37.1 C) (Oral)   Resp 18   Ht 5\' 4"  (1.626 m)   Wt 86.5 kg   SpO2 98%   BMI 32.72 kg/m    Physical Exam  Constitutional: No distress.  HENT:  Nose: No nasal discharge.  Mouth/Throat: Dentition is normal. No dental caries. Oropharynx is clear. Pharynx is normal.  Eyes: Pupils are equal, round, and reactive to light. Conjunctivae are normal.  Neck: Thyroid normal. No JVD present. No neck adenopathy. No thyromegaly present.  Cardiovascular: Normal rate, regular rhythm,  normal heart sounds, intact distal pulses and normal pulses. Exam reveals no gallop.  No murmur heard. No carotid bruits No vericose veins  Pulmonary/Chest: Breath sounds normal. She has no wheezes. She has no rales. She exhibits no tenderness.  Abdominal: Soft. Bowel sounds are normal. She exhibits no distension and no mass. There is no hepatomegaly. There is no abdominal tenderness.  Musculoskeletal:        General: No tenderness or edema. Normal range of motion.     Cervical back: Normal range of motion and neck supple.     Comments: Minor scoliosis  Neurological: She is alert and oriented to person, place, and time.  Skin: Skin is warm and dry. No rash noted. No cyanosis. No jaundice or pallor. Nails show no clubbing.    Diagnostic Studies & Laboratory data:     Recent Radiology Findings:   CARDIAC CATHETERIZATION  Result Date: 12/11/2020 Conclusions: 1. Severe three-vessel coronary artery disease with heavy calcification, including 40% mid LMCA stenosis, sequential 40% mid and 70-80% mid/distal LAD lesions, 50% proximal LCx disease at takeoff of large OM branch followed by sequential 70% and 99% stenoses in OM1, and chronic total occlusion of dominant RCA with left to right and right to right collaterals. 2. Mildly elevated left ventricular filling pressure. Recommendations: 1. Images reviewed with Drs. Gollan and Arida.  Given three-vessel coronary artery disease including subtotal/total occlusions of OM1 and RCA, heavy calcification throughout the coronary arteries, and history of diabetes mellitus, transfer to [facility] for surgical consultation for CABG is recommended. 2. Start IV heparin 2 hours after TR band removal. 3. Obtain echocardiogram. 4. Aggressive secondary prevention. [name], MD [facility]   NM Myocar Multi W/Spect Izetta Dakin Motion / EF  Result Date: 12/11/2020  Abnormal pharmacologic myocardial perfusion stress test.  There is a moderate in size, severe,  reversible defect involving the basal and mid anterolateral and inferolateral segments consistent with ischemia.  There is a small in size, severe, fixed mid anteroseptal and apical septal defect consistent with scar.  The left ventricular ejection fraction is mildly decreased (48%).  Attenuation correction CT demonstrates coronary artery calcification and aortic atherosclerosis.  This is a high risk study.      I have independently reviewed the above radiologic studies and discussed with the patient   Recent Lab Findings: Lab Results  Component Value Date   WBC 10.5 12/12/2020   HGB 11.8 (L) 12/12/2020   HCT 34.9 (L) 12/12/2020   PLT 280 12/12/2020   GLUCOSE 126 (H) 12/12/2020   CHOL 118 12/12/2020   TRIG 130 12/12/2020   HDL 61 12/12/2020   LDLCALC 31 12/12/2020   ALT 30 12/10/2020   AST 38 12/10/2020   NA 136 12/12/2020   K 3.7 12/12/2020   CL 104 12/12/2020   CREATININE 0.62 12/12/2020   BUN 5 (L) 12/12/2020   CO2 23 12/12/2020   TSH 0.962 09/04/2020   INR 1.0 12/12/2020   HGBA1C  6.3 (H) 12/10/2020      Assessment / Plan: Severe three-vessel coronary artery disease in the setting of non-STEMI.  Normal stress testing Obstructive sleep apnea-wears CPAP at night Obesity Hypertension Hyperlipidemia GERD Diabetes mellitus type 2 Osteoarthritis History of diffuse cystic mastopathy History of DVT/pulmonary embolism  Plan: The patient appears to be in adequate candidate to proceed with CABG.  The patient and all relevant studies will be reviewed by the surgeon prior to final decision.    I  spent 55 minutes counseling the patient face to face.   Rowe Clack, PA-C 12/12/2020 12:05 PM  Agree with above 70 yo female with 3V CAD.  She has preserved LV and RV function and no significant valvular disease.  We discussed the surgical revascularization, and she is agreeable to proceed.  She is scheduled for 12/13/20.  Celene Pippins Keane Scrape

## 2020-12-12 NOTE — Progress Notes (Signed)
Pre cabg has been completed.   Preliminary results in CV Proc.   Blanch Media 12/12/2020 5:18 PM

## 2020-12-13 ENCOUNTER — Encounter (HOSPITAL_COMMUNITY): Payer: Self-pay | Admitting: Cardiology

## 2020-12-13 ENCOUNTER — Inpatient Hospital Stay (HOSPITAL_COMMUNITY): Payer: Medicare HMO

## 2020-12-13 ENCOUNTER — Inpatient Hospital Stay (HOSPITAL_COMMUNITY): Payer: Medicare HMO | Admitting: Anesthesiology

## 2020-12-13 ENCOUNTER — Inpatient Hospital Stay (HOSPITAL_COMMUNITY)
Admission: AD | Disposition: A | Discharge status: Home / Self Care | Payer: Self-pay | Attending: Thoracic Surgery (Cardiothoracic Vascular Surgery)

## 2020-12-13 DIAGNOSIS — I2510 Atherosclerotic heart disease of native coronary artery without angina pectoris: Secondary | ICD-10-CM | POA: Diagnosis present

## 2020-12-13 DIAGNOSIS — Z951 Presence of aortocoronary bypass graft: Secondary | ICD-10-CM

## 2020-12-13 HISTORY — PX: CORONARY ARTERY BYPASS GRAFT: SHX141

## 2020-12-13 HISTORY — PX: TEE WITHOUT CARDIOVERSION: SHX5443

## 2020-12-13 LAB — POCT I-STAT 7, (LYTES, BLD GAS, ICA,H+H)
Acid-Base Excess: 2 mmol/L (ref 0.0–2.0)
Acid-Base Excess: 2 mmol/L (ref 0.0–2.0)
Acid-Base Excess: 3 mmol/L — ABNORMAL HIGH (ref 0.0–2.0)
Acid-Base Excess: 2 mmol/L (ref 0.0–2.0)
Acid-Base Excess: 11 mmol/L — ABNORMAL HIGH (ref 0.0–2.0)
Acid-Base Excess: 0 mmol/L (ref 0.0–2.0)
Bicarbonate: 26.4 mmol/L (ref 20.0–28.0)
Bicarbonate: 26.8 mmol/L (ref 20.0–28.0)
Bicarbonate: 26.9 mmol/L (ref 20.0–28.0)
Bicarbonate: 25.7 mmol/L (ref 20.0–28.0)
Bicarbonate: 37.2 mmol/L — ABNORMAL HIGH (ref 20.0–28.0)
Bicarbonate: 24.6 mmol/L (ref 20.0–28.0)
Calcium, Ion: 0.94 mmol/L — ABNORMAL LOW (ref 1.15–1.40)
Calcium, Ion: 0.96 mmol/L — ABNORMAL LOW (ref 1.15–1.40)
Calcium, Ion: 0.98 mmol/L — ABNORMAL LOW (ref 1.15–1.40)
Calcium, Ion: 1.28 mmol/L (ref 1.15–1.40)
Calcium, Ion: 1.07 mmol/L — ABNORMAL LOW (ref 1.15–1.40)
Calcium, Ion: 1.17 mmol/L (ref 1.15–1.40)
HCT: 24 % — ABNORMAL LOW (ref 36.0–46.0)
HCT: 20 % — ABNORMAL LOW (ref 36.0–46.0)
HCT: 22 % — ABNORMAL LOW (ref 36.0–46.0)
HCT: 22 % — ABNORMAL LOW (ref 36.0–46.0)
HCT: 31 % — ABNORMAL LOW (ref 36.0–46.0)
HCT: 30 % — ABNORMAL LOW (ref 36.0–46.0)
Hemoglobin: 8.2 g/dL — ABNORMAL LOW (ref 12.0–15.0)
Hemoglobin: 6.8 g/dL — CL (ref 12.0–15.0)
Hemoglobin: 7.5 g/dL — ABNORMAL LOW (ref 12.0–15.0)
Hemoglobin: 7.5 g/dL — ABNORMAL LOW (ref 12.0–15.0)
Hemoglobin: 10.5 g/dL — ABNORMAL LOW (ref 12.0–15.0)
Hemoglobin: 10.2 g/dL — ABNORMAL LOW (ref 12.0–15.0)
O2 Saturation: 100 %
O2 Saturation: 100 %
O2 Saturation: 100 %
O2 Saturation: 100 %
O2 Saturation: 99 %
O2 Saturation: 99 %
Patient temperature: 35.5
Patient temperature: 37.2
Potassium: 3.4 mmol/L — ABNORMAL LOW (ref 3.5–5.1)
Potassium: 4.2 mmol/L (ref 3.5–5.1)
Potassium: 4.7 mmol/L (ref 3.5–5.1)
Potassium: 3.7 mmol/L (ref 3.5–5.1)
Potassium: 2.9 mmol/L — ABNORMAL LOW (ref 3.5–5.1)
Potassium: 3.8 mmol/L (ref 3.5–5.1)
Sodium: 140 mmol/L (ref 135–145)
Sodium: 137 mmol/L (ref 135–145)
Sodium: 138 mmol/L (ref 135–145)
Sodium: 138 mmol/L (ref 135–145)
Sodium: 147 mmol/L — ABNORMAL HIGH (ref 135–145)
Sodium: 138 mmol/L (ref 135–145)
TCO2: 28 mmol/L (ref 22–32)
TCO2: 28 mmol/L (ref 22–32)
TCO2: 28 mmol/L (ref 22–32)
TCO2: 27 mmol/L (ref 22–32)
TCO2: 39 mmol/L — ABNORMAL HIGH (ref 22–32)
TCO2: 26 mmol/L (ref 22–32)
pCO2 arterial: 39.4 mmHg (ref 32.0–48.0)
pCO2 arterial: 36.9 mmHg (ref 32.0–48.0)
pCO2 arterial: 40.5 mmHg (ref 32.0–48.0)
pCO2 arterial: 36.7 mmHg (ref 32.0–48.0)
pCO2 arterial: 51.1 mmHg — ABNORMAL HIGH (ref 32.0–48.0)
pCO2 arterial: 38.7 mmHg (ref 32.0–48.0)
pH, Arterial: 7.433 (ref 7.350–7.450)
pH, Arterial: 7.429 (ref 7.350–7.450)
pH, Arterial: 7.471 — ABNORMAL HIGH (ref 7.350–7.450)
pH, Arterial: 7.453 — ABNORMAL HIGH (ref 7.350–7.450)
pH, Arterial: 7.464 — ABNORMAL HIGH (ref 7.350–7.450)
pH, Arterial: 7.412 (ref 7.350–7.450)
pO2, Arterial: 357 mmHg — ABNORMAL HIGH (ref 83.0–108.0)
pO2, Arterial: 329 mmHg — ABNORMAL HIGH (ref 83.0–108.0)
pO2, Arterial: 383 mmHg — ABNORMAL HIGH (ref 83.0–108.0)
pO2, Arterial: 188 mmHg — ABNORMAL HIGH (ref 83.0–108.0)
pO2, Arterial: 135 mmHg — ABNORMAL HIGH (ref 83.0–108.0)
pO2, Arterial: 140 mmHg — ABNORMAL HIGH (ref 83.0–108.0)

## 2020-12-13 LAB — CBC
HCT: 34.6 % — ABNORMAL LOW (ref 36.0–46.0)
HCT: 31.3 % — ABNORMAL LOW (ref 36.0–46.0)
HCT: 31.9 % — ABNORMAL LOW (ref 36.0–46.0)
Hemoglobin: 12 g/dL (ref 12.0–15.0)
Hemoglobin: 10.9 g/dL — ABNORMAL LOW (ref 12.0–15.0)
Hemoglobin: 10.6 g/dL — ABNORMAL LOW (ref 12.0–15.0)
MCH: 31.2 pg (ref 26.0–34.0)
MCH: 30.9 pg (ref 26.0–34.0)
MCH: 29.9 pg (ref 26.0–34.0)
MCHC: 34.7 g/dL (ref 30.0–36.0)
MCHC: 34.8 g/dL (ref 30.0–36.0)
MCHC: 33.2 g/dL (ref 30.0–36.0)
MCV: 89.9 fL (ref 80.0–100.0)
MCV: 88.7 fL (ref 80.0–100.0)
MCV: 90.1 fL (ref 80.0–100.0)
Platelets: 278 10*3/uL (ref 150–400)
Platelets: 151 10*3/uL (ref 150–400)
Platelets: 164 10*3/uL (ref 150–400)
RBC: 3.85 MIL/uL — ABNORMAL LOW (ref 3.87–5.11)
RBC: 3.53 MIL/uL — ABNORMAL LOW (ref 3.87–5.11)
RBC: 3.54 MIL/uL — ABNORMAL LOW (ref 3.87–5.11)
RDW: 12.8 % (ref 11.5–15.5)
RDW: 13.1 % (ref 11.5–15.5)
RDW: 13.4 % (ref 11.5–15.5)
WBC: 9.2 10*3/uL (ref 4.0–10.5)
WBC: 14 10*3/uL — ABNORMAL HIGH (ref 4.0–10.5)
WBC: 13.4 10*3/uL — ABNORMAL HIGH (ref 4.0–10.5)
nRBC: 0 % (ref 0.0–0.2)
nRBC: 0 % (ref 0.0–0.2)
nRBC: 0 % (ref 0.0–0.2)

## 2020-12-13 LAB — COMPREHENSIVE METABOLIC PANEL WITH GFR
ALT: 21 U/L (ref 0–44)
AST: 26 U/L (ref 15–41)
Albumin: 3.8 g/dL (ref 3.5–5.0)
Alkaline Phosphatase: 41 U/L (ref 38–126)
Anion gap: 10 (ref 5–15)
BUN: 5 mg/dL — ABNORMAL LOW (ref 8–23)
CO2: 23 mmol/L (ref 22–32)
Calcium: 9.1 mg/dL (ref 8.9–10.3)
Chloride: 104 mmol/L (ref 98–111)
Creatinine, Ser: 0.67 mg/dL (ref 0.44–1.00)
GFR, Estimated: >60 mL/min
Glucose, Bld: 104 mg/dL — ABNORMAL HIGH (ref 70–99)
Potassium: 3.4 mmol/L — ABNORMAL LOW (ref 3.5–5.1)
Sodium: 137 mmol/L (ref 135–145)
Total Bilirubin: 0.7 mg/dL (ref 0.3–1.2)
Total Protein: 6.2 g/dL — ABNORMAL LOW (ref 6.5–8.1)

## 2020-12-13 LAB — POCT I-STAT, CHEM 8
BUN: 4 mg/dL — ABNORMAL LOW (ref 8–23)
BUN: <3 mg/dL — ABNORMAL LOW (ref 8–23)
BUN: 3 mg/dL — ABNORMAL LOW (ref 8–23)
BUN: 3 mg/dL — ABNORMAL LOW (ref 8–23)
Calcium, Ion: 1.2 mmol/L (ref 1.15–1.40)
Calcium, Ion: 0.82 mmol/L — CL (ref 1.15–1.40)
Calcium, Ion: 0.96 mmol/L — ABNORMAL LOW (ref 1.15–1.40)
Calcium, Ion: 1.28 mmol/L (ref 1.15–1.40)
Chloride: 101 mmol/L (ref 98–111)
Chloride: 94 mmol/L — ABNORMAL LOW (ref 98–111)
Chloride: 100 mmol/L (ref 98–111)
Chloride: 103 mmol/L (ref 98–111)
Creatinine, Ser: 0.4 mg/dL — ABNORMAL LOW (ref 0.44–1.00)
Creatinine, Ser: <0.2 mg/dL — ABNORMAL LOW (ref 0.44–1.00)
Creatinine, Ser: 0.3 mg/dL — ABNORMAL LOW (ref 0.44–1.00)
Creatinine, Ser: 0.4 mg/dL — ABNORMAL LOW (ref 0.44–1.00)
Glucose, Bld: 104 mg/dL — ABNORMAL HIGH (ref 70–99)
Glucose, Bld: 87 mg/dL (ref 70–99)
Glucose, Bld: 100 mg/dL — ABNORMAL HIGH (ref 70–99)
Glucose, Bld: 124 mg/dL — ABNORMAL HIGH (ref 70–99)
HCT: 28 % — ABNORMAL LOW (ref 36.0–46.0)
HCT: 18 % — ABNORMAL LOW (ref 36.0–46.0)
HCT: 22 % — ABNORMAL LOW (ref 36.0–46.0)
HCT: 23 % — ABNORMAL LOW (ref 36.0–46.0)
Hemoglobin: 9.5 g/dL — ABNORMAL LOW (ref 12.0–15.0)
Hemoglobin: 6.1 g/dL — CL (ref 12.0–15.0)
Hemoglobin: 7.5 g/dL — ABNORMAL LOW (ref 12.0–15.0)
Hemoglobin: 7.8 g/dL — ABNORMAL LOW (ref 12.0–15.0)
Potassium: 3.3 mmol/L — ABNORMAL LOW (ref 3.5–5.1)
Potassium: 4 mmol/L (ref 3.5–5.1)
Potassium: 4.3 mmol/L (ref 3.5–5.1)
Potassium: 3.7 mmol/L (ref 3.5–5.1)
Sodium: 136 mmol/L (ref 135–145)
Sodium: 131 mmol/L — ABNORMAL LOW (ref 135–145)
Sodium: 137 mmol/L (ref 135–145)
Sodium: 137 mmol/L (ref 135–145)
TCO2: 25 mmol/L (ref 22–32)
TCO2: 24 mmol/L (ref 22–32)
TCO2: 27 mmol/L (ref 22–32)
TCO2: 29 mmol/L (ref 22–32)

## 2020-12-13 LAB — POCT I-STAT EG7
Acid-Base Excess: 0 mmol/L (ref 0.0–2.0)
Bicarbonate: 25.5 mmol/L (ref 20.0–28.0)
Calcium, Ion: 1 mmol/L — ABNORMAL LOW (ref 1.15–1.40)
HCT: 21 % — ABNORMAL LOW (ref 36.0–46.0)
Hemoglobin: 7.1 g/dL — ABNORMAL LOW (ref 12.0–15.0)
O2 Saturation: 81 %
Potassium: 5.2 mmol/L — ABNORMAL HIGH (ref 3.5–5.1)
Sodium: 138 mmol/L (ref 135–145)
TCO2: 27 mmol/L (ref 22–32)
pCO2, Ven: 42.1 mmHg — ABNORMAL LOW (ref 44.0–60.0)
pH, Ven: 7.39 (ref 7.250–7.430)
pO2, Ven: 46 mmHg — ABNORMAL HIGH (ref 32.0–45.0)

## 2020-12-13 LAB — BASIC METABOLIC PANEL
Anion gap: 8 (ref 5–15)
BUN: <5 mg/dL — ABNORMAL LOW (ref 8–23)
CO2: 26 mmol/L (ref 22–32)
Calcium: 8.6 mg/dL — ABNORMAL LOW (ref 8.9–10.3)
Chloride: 105 mmol/L (ref 98–111)
Creatinine, Ser: 0.58 mg/dL (ref 0.44–1.00)
GFR, Estimated: >60 mL/min (ref >60)
Glucose, Bld: 122 mg/dL — ABNORMAL HIGH (ref 70–99)
Potassium: 3.7 mmol/L (ref 3.5–5.1)
Sodium: 139 mmol/L (ref 135–145)

## 2020-12-13 LAB — GLUCOSE, CAPILLARY
Glucose-Capillary: 111 mg/dL — ABNORMAL HIGH (ref 70–99)
Glucose-Capillary: 114 mg/dL — ABNORMAL HIGH (ref 70–99)
Glucose-Capillary: 127 mg/dL — ABNORMAL HIGH (ref 70–99)
Glucose-Capillary: 70 mg/dL (ref 70–99)
Glucose-Capillary: 127 mg/dL — ABNORMAL HIGH (ref 70–99)
Glucose-Capillary: 120 mg/dL — ABNORMAL HIGH (ref 70–99)
Glucose-Capillary: 104 mg/dL — ABNORMAL HIGH (ref 70–99)
Glucose-Capillary: 118 mg/dL — ABNORMAL HIGH (ref 70–99)
Glucose-Capillary: 129 mg/dL — ABNORMAL HIGH (ref 70–99)
Glucose-Capillary: 169 mg/dL — ABNORMAL HIGH (ref 70–99)

## 2020-12-13 LAB — SURGICAL PCR SCREEN
MRSA, PCR: NEGATIVE
Staphylococcus aureus: POSITIVE — AB

## 2020-12-13 LAB — MAGNESIUM: Magnesium: 3.4 mg/dL — ABNORMAL HIGH (ref 1.7–2.4)

## 2020-12-13 LAB — PROTIME-INR
INR: 1.3 — ABNORMAL HIGH (ref 0.8–1.2)
Prothrombin Time: 16.5 seconds — ABNORMAL HIGH (ref 11.4–15.2)

## 2020-12-13 LAB — HEMOGLOBIN AND HEMATOCRIT, BLOOD
HCT: 20.4 % — ABNORMAL LOW (ref 36.0–46.0)
Hemoglobin: 6.9 g/dL — CL (ref 12.0–15.0)

## 2020-12-13 LAB — URINALYSIS, ROUTINE W REFLEX MICROSCOPIC
Bilirubin Urine: NEGATIVE
Glucose, UA: NEGATIVE mg/dL
Ketones, ur: NEGATIVE mg/dL
Nitrite: NEGATIVE
Protein, ur: NEGATIVE mg/dL
Specific Gravity, Urine: 1.008 (ref 1.005–1.030)
WBC, UA: >50 WBC/hpf — ABNORMAL HIGH (ref 0–5)
pH: 7 (ref 5.0–8.0)

## 2020-12-13 LAB — HEPARIN LEVEL (UNFRACTIONATED): Heparin Unfractionated: 0.68 IU/mL (ref 0.30–0.70)

## 2020-12-13 LAB — PREPARE RBC (CROSSMATCH)

## 2020-12-13 LAB — PLATELET COUNT: Platelets: 179 10*3/uL (ref 150–400)

## 2020-12-13 LAB — APTT: aPTT: 27 seconds (ref 24–36)

## 2020-12-13 SURGERY — CORONARY ARTERY BYPASS GRAFTING (CABG)
Anesthesia: General | Site: Chest

## 2020-12-13 MED ORDER — SODIUM CHLORIDE 0.9% FLUSH: 3.0000 mL | INTRAVENOUS | Status: DC | PRN

## 2020-12-13 MED ORDER — ASPIRIN EC 325 MG PO TBEC
325.0000 mg | DELAYED_RELEASE_TABLET | Freq: Every day | ORAL | Status: DC
  Administered 2020-12-14 – 2020-12-17 (×4): 325 mg via ORAL
  Filled 2020-12-13 (×4): qty 1

## 2020-12-13 MED ORDER — PROPOFOL 10 MG/ML IV BOLUS
INTRAVENOUS | Status: DC | PRN
  Administered 2020-12-13: 30 mg via INTRAVENOUS

## 2020-12-13 MED ORDER — CHLORHEXIDINE GLUCONATE 0.12% ORAL RINSE (MEDLINE KIT)
15.0000 mL | Freq: Two times a day (BID) | OROMUCOSAL | Status: DC
  Administered 2020-12-13: 15 mL via OROMUCOSAL

## 2020-12-13 MED ORDER — CHLORHEXIDINE GLUCONATE 0.12 % MT SOLN: 15.0000 mL | OROMUCOSAL | Status: DC

## 2020-12-13 MED ORDER — LACTATED RINGERS IV SOLN: INTRAVENOUS | Status: DC

## 2020-12-13 MED ORDER — DOCUSATE SODIUM 100 MG PO CAPS
200.0000 mg | ORAL_CAPSULE | Freq: Every day | ORAL | Status: DC
  Administered 2020-12-14 – 2020-12-17 (×4): 200 mg via ORAL
  Filled 2020-12-13 (×5): qty 2

## 2020-12-13 MED ORDER — CHLORHEXIDINE GLUCONATE CLOTH 2 % EX PADS
6.0000 | MEDICATED_PAD | Freq: Every day | CUTANEOUS | Status: DC
  Administered 2020-12-13 – 2020-12-17 (×5): 6 via TOPICAL

## 2020-12-13 MED ORDER — ACETAMINOPHEN 160 MG/5ML PO SOLN: 1000.0000 mg | Freq: Four times a day (QID) | ORAL | Status: DC

## 2020-12-13 MED ORDER — ORAL CARE MOUTH RINSE: 15.0000 mL | Freq: Once | OROMUCOSAL | Status: AC

## 2020-12-13 MED ORDER — SODIUM CHLORIDE 0.9% FLUSH: 10.0000 mL | INTRAVENOUS | Status: DC | PRN

## 2020-12-13 MED ORDER — POTASSIUM CHLORIDE 10 MEQ/50ML IV SOLN
10.0000 meq | INTRAVENOUS | Status: AC
  Administered 2020-12-13 (×3): 10 meq via INTRAVENOUS

## 2020-12-13 MED ORDER — MAGNESIUM SULFATE 4 GM/100ML IV SOLN
4.0000 g | Freq: Once | INTRAVENOUS | Status: AC
  Administered 2020-12-13: 4 g via INTRAVENOUS
  Filled 2020-12-13: qty 100

## 2020-12-13 MED ORDER — SODIUM BICARBONATE 8.4 % IV SOLN
INTRAVENOUS | Status: AC
  Filled 2020-12-13: qty 200

## 2020-12-13 MED ORDER — SODIUM CHLORIDE 0.9% FLUSH
3.0000 mL | Freq: Two times a day (BID) | INTRAVENOUS | Status: DC
  Administered 2020-12-14 – 2020-12-18 (×7): 3 mL via INTRAVENOUS

## 2020-12-13 MED ORDER — NITROGLYCERIN IN D5W 200-5 MCG/ML-% IV SOLN: 0.0000 ug/min | INTRAVENOUS | Status: DC

## 2020-12-13 MED ORDER — FAMOTIDINE IN NACL 20-0.9 MG/50ML-% IV SOLN
20.0000 mg | Freq: Two times a day (BID) | INTRAVENOUS | Status: DC
  Administered 2020-12-13: 20 mg via INTRAVENOUS
  Filled 2020-12-13 (×2): qty 50

## 2020-12-13 MED ORDER — EZETIMIBE 10 MG PO TABS
10.0000 mg | ORAL_TABLET | Freq: Every day | ORAL | Status: DC
  Administered 2020-12-14 – 2020-12-18 (×5): 10 mg via ORAL
  Filled 2020-12-13 (×5): qty 1

## 2020-12-13 MED ORDER — EPHEDRINE 5 MG/ML INJ
INTRAVENOUS | Status: AC
  Filled 2020-12-13: qty 10

## 2020-12-13 MED ORDER — CHLORHEXIDINE GLUCONATE 0.12 % MT SOLN
15.0000 mL | Freq: Once | OROMUCOSAL | Status: AC
  Administered 2020-12-13: 15 mL via OROMUCOSAL

## 2020-12-13 MED ORDER — CEFAZOLIN SODIUM-DEXTROSE 2-4 GM/100ML-% IV SOLN
2.0000 g | Freq: Three times a day (TID) | INTRAVENOUS | Status: AC
  Administered 2020-12-13 – 2020-12-15 (×6): 2 g via INTRAVENOUS
  Filled 2020-12-13 (×8): qty 100

## 2020-12-13 MED ORDER — NICARDIPINE HCL IN NACL 20-0.86 MG/200ML-% IV SOLN
0.0000 mg/h | INTRAVENOUS | Status: DC
  Administered 2020-12-13: 5 mg/h via INTRAVENOUS
  Filled 2020-12-13 (×2): qty 200

## 2020-12-13 MED ORDER — METOPROLOL TARTRATE 5 MG/5ML IV SOLN
2.5000 mg | INTRAVENOUS | Status: DC | PRN
Start: 2020-12-13 — End: 2020-12-18
  Administered 2020-12-15 – 2020-12-16 (×3): 5 mg via INTRAVENOUS
  Filled 2020-12-13 (×3): qty 5

## 2020-12-13 MED ORDER — POTASSIUM CHLORIDE 10 MEQ/50ML IV SOLN
10.0000 meq | INTRAVENOUS | Status: AC
  Administered 2020-12-13 (×2): 10 meq via INTRAVENOUS
  Filled 2020-12-13: qty 50

## 2020-12-13 MED ORDER — SODIUM CHLORIDE (PF) 0.9 % IJ SOLN
OROMUCOSAL | Status: DC | PRN
  Administered 2020-12-13 (×3): 4 mL via TOPICAL

## 2020-12-13 MED ORDER — INSULIN REGULAR(HUMAN) IN NACL 100-0.9 UT/100ML-% IV SOLN: INTRAVENOUS | Status: DC

## 2020-12-13 MED ORDER — ORAL CARE MOUTH RINSE
15.0000 mL | Freq: Two times a day (BID) | OROMUCOSAL | Status: DC
  Administered 2020-12-14 – 2020-12-18 (×8): 15 mL via OROMUCOSAL

## 2020-12-13 MED ORDER — CALCIUM GLUCONATE-NACL 1-0.675 GM/50ML-% IV SOLN
1.0000 g | Freq: Once | INTRAVENOUS | Status: AC
  Administered 2020-12-13: 1000 mg via INTRAVENOUS
  Filled 2020-12-13: qty 50

## 2020-12-13 MED ORDER — DEXTROSE 50 % IV SOLN
0.0000 mL | INTRAVENOUS | Status: DC | PRN
Start: 2020-12-13 — End: 2020-12-18

## 2020-12-13 MED ORDER — PROPOFOL 10 MG/ML IV BOLUS
INTRAVENOUS | Status: AC
  Filled 2020-12-13: qty 20

## 2020-12-13 MED ORDER — DEXMEDETOMIDINE HCL IN NACL 400 MCG/100ML IV SOLN
0.0000 ug/kg/h | INTRAVENOUS | Status: DC
  Administered 2020-12-13: 0.7 ug/kg/h via INTRAVENOUS
  Filled 2020-12-13: qty 100

## 2020-12-13 MED ORDER — PHENYLEPHRINE 40 MCG/ML (10ML) SYRINGE FOR IV PUSH (FOR BLOOD PRESSURE SUPPORT)
PREFILLED_SYRINGE | INTRAVENOUS | Status: AC
  Filled 2020-12-13: qty 10

## 2020-12-13 MED ORDER — PROTAMINE SULFATE 10 MG/ML IV SOLN
INTRAVENOUS | Status: DC | PRN
  Administered 2020-12-13: 280 mg via INTRAVENOUS
  Administered 2020-12-13: 30 mg via INTRAVENOUS

## 2020-12-13 MED ORDER — ALBUMIN HUMAN 5 % IV SOLN
250.0000 mL | INTRAVENOUS | Status: AC | PRN
  Administered 2020-12-13 (×4): 12.5 g via INTRAVENOUS

## 2020-12-13 MED ORDER — ROCURONIUM BROMIDE 10 MG/ML (PF) SYRINGE
PREFILLED_SYRINGE | INTRAVENOUS | Status: AC
  Filled 2020-12-13: qty 20

## 2020-12-13 MED ORDER — SODIUM CHLORIDE 0.9 % IV SOLN: INTRAVENOUS | Status: DC | PRN

## 2020-12-13 MED ORDER — ROCURONIUM BROMIDE 10 MG/ML (PF) SYRINGE
PREFILLED_SYRINGE | INTRAVENOUS | Status: DC | PRN
  Administered 2020-12-13: 80 mg via INTRAVENOUS
  Administered 2020-12-13: 50 mg via INTRAVENOUS
  Administered 2020-12-13: 40 mg via INTRAVENOUS
  Administered 2020-12-13: 20 mg via INTRAVENOUS

## 2020-12-13 MED ORDER — PROTAMINE SULFATE 10 MG/ML IV SOLN
INTRAVENOUS | Status: AC
  Filled 2020-12-13: qty 25

## 2020-12-13 MED ORDER — PROTAMINE SULFATE 10 MG/ML IV SOLN
INTRAVENOUS | Status: AC
  Filled 2020-12-13: qty 5

## 2020-12-13 MED ORDER — PHENYLEPHRINE 40 MCG/ML (10ML) SYRINGE FOR IV PUSH (FOR BLOOD PRESSURE SUPPORT)
PREFILLED_SYRINGE | INTRAVENOUS | Status: DC | PRN
  Administered 2020-12-13 (×2): 80 ug via INTRAVENOUS

## 2020-12-13 MED ORDER — HEPARIN SODIUM (PORCINE) 1000 UNIT/ML IJ SOLN
INTRAMUSCULAR | Status: AC
  Filled 2020-12-13: qty 1

## 2020-12-13 MED ORDER — 0.9 % SODIUM CHLORIDE (POUR BTL) OPTIME
TOPICAL | Status: DC | PRN
  Administered 2020-12-13: 5000 mL

## 2020-12-13 MED ORDER — CHLORHEXIDINE GLUCONATE 0.12 % MT SOLN
OROMUCOSAL | Status: AC
  Filled 2020-12-13: qty 15

## 2020-12-13 MED ORDER — SODIUM CHLORIDE 0.9 % IV SOLN: 250.0000 mL | INTRAVENOUS | Status: DC

## 2020-12-13 MED ORDER — HEPARIN SODIUM (PORCINE) 1000 UNIT/ML IJ SOLN
INTRAMUSCULAR | Status: DC | PRN
  Administered 2020-12-13: 31000 [IU] via INTRAVENOUS

## 2020-12-13 MED ORDER — PHENYLEPHRINE HCL-NACL 20-0.9 MG/250ML-% IV SOLN: 0.0000 ug/min | INTRAVENOUS | Status: DC

## 2020-12-13 MED ORDER — LACTATED RINGERS IV SOLN: INTRAVENOUS | Status: DC | PRN

## 2020-12-13 MED ORDER — ACETAMINOPHEN 500 MG PO TABS
1000.0000 mg | ORAL_TABLET | Freq: Four times a day (QID) | ORAL | Status: DC
  Administered 2020-12-14 – 2020-12-18 (×17): 1000 mg via ORAL
  Filled 2020-12-13 (×17): qty 2

## 2020-12-13 MED ORDER — ORAL CARE MOUTH RINSE: 15.0000 mL | OROMUCOSAL | Status: DC

## 2020-12-13 MED ORDER — TRAMADOL HCL 50 MG PO TABS
50.0000 mg | ORAL_TABLET | ORAL | Status: DC | PRN
  Administered 2020-12-14: 100 mg via ORAL
  Administered 2020-12-14: 50 mg via ORAL
  Filled 2020-12-13: qty 1
  Filled 2020-12-13: qty 2

## 2020-12-13 MED ORDER — MIDAZOLAM HCL 5 MG/5ML IJ SOLN
INTRAMUSCULAR | Status: DC | PRN
  Administered 2020-12-13: 2 mg via INTRAVENOUS
  Administered 2020-12-13: 3 mg via INTRAVENOUS
  Administered 2020-12-13: 2 mg via INTRAVENOUS

## 2020-12-13 MED ORDER — SODIUM CHLORIDE 0.45 % IV SOLN: INTRAVENOUS | Status: DC | PRN

## 2020-12-13 MED ORDER — CHLORHEXIDINE GLUCONATE CLOTH 2 % EX PADS
6.0000 | MEDICATED_PAD | Freq: Every day | CUTANEOUS | Status: DC
  Administered 2020-12-13: 6 via TOPICAL

## 2020-12-13 MED ORDER — PLASMA-LYTE 148 IV SOLN
INTRAVENOUS | Status: DC | PRN
  Administered 2020-12-13: 500 mL via INTRAVASCULAR

## 2020-12-13 MED ORDER — MUPIROCIN 2 % EX OINT
1.0000 "application " | TOPICAL_OINTMENT | Freq: Two times a day (BID) | CUTANEOUS | Status: AC
  Administered 2020-12-13 – 2020-12-17 (×9): 1 via NASAL
  Filled 2020-12-13 (×6): qty 22

## 2020-12-13 MED ORDER — LIDOCAINE 2% (20 MG/ML) 5 ML SYRINGE
INTRAMUSCULAR | Status: DC | PRN
  Administered 2020-12-13: 80 mg via INTRAVENOUS

## 2020-12-13 MED ORDER — LEVALBUTEROL HCL 0.63 MG/3ML IN NEBU: 0.6300 mg | INHALATION_SOLUTION | Freq: Three times a day (TID) | RESPIRATORY_TRACT | Status: DC | PRN

## 2020-12-13 MED ORDER — PANTOPRAZOLE SODIUM 40 MG PO TBEC
40.0000 mg | DELAYED_RELEASE_TABLET | Freq: Every day | ORAL | Status: DC
  Administered 2020-12-15 – 2020-12-18 (×4): 40 mg via ORAL
  Filled 2020-12-13 (×4): qty 1

## 2020-12-13 MED ORDER — BISACODYL 10 MG RE SUPP: 10.0000 mg | Freq: Every day | RECTAL | Status: DC

## 2020-12-13 MED ORDER — FENTANYL CITRATE (PF) 250 MCG/5ML IJ SOLN
INTRAMUSCULAR | Status: DC | PRN
  Administered 2020-12-13: 150 ug via INTRAVENOUS
  Administered 2020-12-13: 350 ug via INTRAVENOUS
  Administered 2020-12-13: 150 ug via INTRAVENOUS
  Administered 2020-12-13: 50 ug via INTRAVENOUS
  Administered 2020-12-13: 100 ug via INTRAVENOUS
  Administered 2020-12-13: 150 ug via INTRAVENOUS

## 2020-12-13 MED ORDER — BISACODYL 5 MG PO TBEC
10.0000 mg | DELAYED_RELEASE_TABLET | Freq: Every day | ORAL | Status: DC
  Administered 2020-12-14 – 2020-12-17 (×4): 10 mg via ORAL
  Filled 2020-12-13 (×4): qty 2

## 2020-12-13 MED ORDER — EPHEDRINE SULFATE-NACL 50-0.9 MG/10ML-% IV SOSY
PREFILLED_SYRINGE | INTRAVENOUS | Status: DC | PRN
  Administered 2020-12-13: 10 mg via INTRAVENOUS

## 2020-12-13 MED ORDER — ACETAMINOPHEN 160 MG/5ML PO SOLN: 650.0000 mg | Freq: Once | ORAL | Status: AC

## 2020-12-13 MED ORDER — SODIUM CHLORIDE 0.9% FLUSH
10.0000 mL | Freq: Two times a day (BID) | INTRAVENOUS | Status: DC
  Administered 2020-12-14: 10 mL

## 2020-12-13 MED ORDER — OXYCODONE HCL 5 MG PO TABS
5.0000 mg | ORAL_TABLET | ORAL | Status: DC | PRN
  Administered 2020-12-14: 5 mg via ORAL
  Administered 2020-12-14 – 2020-12-15 (×5): 10 mg via ORAL
  Filled 2020-12-13: qty 1
  Filled 2020-12-13 (×5): qty 2

## 2020-12-13 MED ORDER — MIDAZOLAM HCL (PF) 10 MG/2ML IJ SOLN
INTRAMUSCULAR | Status: AC
  Filled 2020-12-13: qty 2

## 2020-12-13 MED ORDER — ARTIFICIAL TEARS OPHTHALMIC OINT
TOPICAL_OINTMENT | Freq: Every evening | OPHTHALMIC | Status: DC | PRN
  Filled 2020-12-13: qty 3.5

## 2020-12-13 MED ORDER — METOPROLOL TARTRATE 12.5 MG HALF TABLET: 12.5000 mg | ORAL_TABLET | Freq: Two times a day (BID) | ORAL | Status: DC

## 2020-12-13 MED ORDER — VANCOMYCIN HCL IN DEXTROSE 1-5 GM/200ML-% IV SOLN
1000.0000 mg | Freq: Once | INTRAVENOUS | Status: AC
  Administered 2020-12-13: 1000 mg via INTRAVENOUS
  Filled 2020-12-13: qty 200

## 2020-12-13 MED ORDER — ONDANSETRON HCL 4 MG/2ML IJ SOLN
4.0000 mg | Freq: Four times a day (QID) | INTRAMUSCULAR | Status: DC | PRN
  Administered 2020-12-14 – 2020-12-15 (×2): 4 mg via INTRAVENOUS
  Filled 2020-12-13 (×2): qty 2

## 2020-12-13 MED ORDER — METOPROLOL TARTRATE 25 MG/10 ML ORAL SUSPENSION: 12.5000 mg | Freq: Two times a day (BID) | ORAL | Status: DC

## 2020-12-13 MED ORDER — ACETAMINOPHEN 650 MG RE SUPP
650.0000 mg | Freq: Once | RECTAL | Status: AC
  Administered 2020-12-13: 650 mg via RECTAL

## 2020-12-13 MED ORDER — ASPIRIN 81 MG PO CHEW: 324.0000 mg | CHEWABLE_TABLET | Freq: Every day | ORAL | Status: DC

## 2020-12-13 MED ORDER — MIDAZOLAM HCL 2 MG/2ML IJ SOLN: 2.0000 mg | INTRAMUSCULAR | Status: DC | PRN

## 2020-12-13 MED ORDER — LIDOCAINE 2% (20 MG/ML) 5 ML SYRINGE
INTRAMUSCULAR | Status: AC
  Filled 2020-12-13: qty 5

## 2020-12-13 MED ORDER — SODIUM CHLORIDE 0.9 % IV SOLN: INTRAVENOUS | Status: DC

## 2020-12-13 MED ORDER — FENTANYL CITRATE (PF) 250 MCG/5ML IJ SOLN
INTRAMUSCULAR | Status: AC
  Filled 2020-12-13: qty 25

## 2020-12-13 MED ORDER — MORPHINE SULFATE (PF) 2 MG/ML IV SOLN
1.0000 mg | INTRAVENOUS | Status: DC | PRN
  Administered 2020-12-13: 1 mg via INTRAVENOUS
  Filled 2020-12-13: qty 1

## 2020-12-13 MED ORDER — LACTATED RINGERS IV SOLN: 500.0000 mL | Freq: Once | INTRAVENOUS | Status: DC | PRN

## 2020-12-13 MED ORDER — ALBUMIN HUMAN 5 % IV SOLN: INTRAVENOUS | Status: DC | PRN

## 2020-12-13 SURGICAL SUPPLY — 83 items
BAG DECANTER FOR FLEXI CONT (MISCELLANEOUS) ×3 IMPLANT
BLADE CLIPPER SURG (BLADE) ×3 IMPLANT
BLADE STERNUM SYSTEM 6 (BLADE) ×3 IMPLANT
BNDG ELASTIC 4X5.8 VLCR STR LF (GAUZE/BANDAGES/DRESSINGS) ×3 IMPLANT
BNDG ELASTIC 6X5.8 VLCR STR LF (GAUZE/BANDAGES/DRESSINGS) ×3 IMPLANT
BNDG GAUZE ELAST 4 BULKY (GAUZE/BANDAGES/DRESSINGS) ×3 IMPLANT
CABLE SURGICAL S-101-97-12 (CABLE) ×3 IMPLANT
CANISTER SUCT 3000ML PPV (MISCELLANEOUS) ×3 IMPLANT
CANNULA MC2 2 STG 29/37 NON-V (CANNULA) ×2 IMPLANT
CANNULA MC2 TWO STAGE (CANNULA) ×1
CANNULA NON VENT 20FR 12 (CANNULA) ×3 IMPLANT
CATH ROBINSON RED A/P 18FR (CATHETERS) ×6 IMPLANT
CLIP RETRACTION 3.0MM CORONARY (MISCELLANEOUS) ×3 IMPLANT
CLIP VESOCCLUDE MED 24/CT (CLIP) IMPLANT
CLIP VESOCCLUDE SM WIDE 24/CT (CLIP) ×1 IMPLANT
CONN ST 1/2X1/2  BEN (MISCELLANEOUS) ×1
CONN ST 1/2X1/2 BEN (MISCELLANEOUS) ×2 IMPLANT
CONNECTOR BLAKE 2:1 CARIO BLK (MISCELLANEOUS) ×3 IMPLANT
CONTAINER PROTECT SURGISLUSH (MISCELLANEOUS) ×3 IMPLANT
DERMABOND ADVANCED (GAUZE/BANDAGES/DRESSINGS) ×1
DERMABOND ADVANCED .7 DNX12 (GAUZE/BANDAGES/DRESSINGS) IMPLANT
DRAIN CHANNEL 19F RND (DRAIN) ×9 IMPLANT
DRAIN CONNECTOR BLAKE 1:1 (MISCELLANEOUS) ×3 IMPLANT
DRAPE CARDIOVASCULAR INCISE (DRAPES) ×1
DRAPE INCISE IOBAN 66X45 STRL (DRAPES) IMPLANT
DRAPE SRG 135X102X78XABS (DRAPES) ×2 IMPLANT
DRAPE WARM FLUID 44X44 (DRAPES) ×3 IMPLANT
DRSG AQUACEL AG ADV 3.5X10 (GAUZE/BANDAGES/DRESSINGS) ×3 IMPLANT
DRSG COVADERM 4X14 (GAUZE/BANDAGES/DRESSINGS) ×3 IMPLANT
ELECT BLADE 4.0 EZ CLEAN MEGAD (MISCELLANEOUS) ×3
ELECT REM PT RETURN 9FT ADLT (ELECTROSURGICAL) ×6
ELECTRODE BLDE 4.0 EZ CLN MEGD (MISCELLANEOUS) ×2 IMPLANT
ELECTRODE REM PT RTRN 9FT ADLT (ELECTROSURGICAL) ×4 IMPLANT
FELT TEFLON 1X6 (MISCELLANEOUS) ×6 IMPLANT
GAUZE SPONGE 4X4 12PLY STRL (GAUZE/BANDAGES/DRESSINGS) ×6 IMPLANT
GLOVE BIO SURGEON STRL SZ7 (GLOVE) ×6 IMPLANT
GLOVE BIOGEL M STRL SZ7.5 (GLOVE) ×6 IMPLANT
GOWN STRL REUS W/ TWL LRG LVL3 (GOWN DISPOSABLE) ×8 IMPLANT
GOWN STRL REUS W/ TWL XL LVL3 (GOWN DISPOSABLE) ×4 IMPLANT
GOWN STRL REUS W/TWL LRG LVL3 (GOWN DISPOSABLE) ×8
GOWN STRL REUS W/TWL XL LVL3 (GOWN DISPOSABLE) ×2
HEMOSTAT POWDER SURGIFOAM 1G (HEMOSTASIS) ×9 IMPLANT
INSERT SUTURE HOLDER (MISCELLANEOUS) ×3 IMPLANT
KIT BASIN OR (CUSTOM PROCEDURE TRAY) ×3 IMPLANT
KIT SUCTION CATH 14FR (SUCTIONS) ×3 IMPLANT
KIT TURNOVER KIT B (KITS) ×3 IMPLANT
KIT VASOVIEW HEMOPRO 2 VH 4000 (KITS) ×3 IMPLANT
LEAD PACING MYOCARDI (MISCELLANEOUS) ×3 IMPLANT
MARKER GRAFT CORONARY BYPASS (MISCELLANEOUS) ×9 IMPLANT
NS IRRIG 1000ML POUR BTL (IV SOLUTION) ×15 IMPLANT
PACK ACCESSORY CANNULA KIT (KITS) ×3 IMPLANT
PACK E OPEN HEART (SUTURE) ×3 IMPLANT
PACK OPEN HEART (CUSTOM PROCEDURE TRAY) ×3 IMPLANT
PAD ARMBOARD 7.5X6 YLW CONV (MISCELLANEOUS) ×6 IMPLANT
PAD ELECT DEFIB RADIOL ZOLL (MISCELLANEOUS) ×3 IMPLANT
PENCIL BUTTON HOLSTER BLD 10FT (ELECTRODE) ×3 IMPLANT
POSITIONER HEAD DONUT 9IN (MISCELLANEOUS) ×3 IMPLANT
PUNCH AORTIC ROTATE 4.0MM (MISCELLANEOUS) ×3 IMPLANT
SET MPS 3-ND DEL (MISCELLANEOUS) ×1 IMPLANT
SUPPORT HEART JANKE-BARRON (MISCELLANEOUS) ×3 IMPLANT
SUT BONE WAX W31G (SUTURE) ×3 IMPLANT
SUT ETHIBOND X763 2 0 SH 1 (SUTURE) ×6 IMPLANT
SUT MNCRL AB 3-0 PS2 18 (SUTURE) ×6 IMPLANT
SUT MNCRL AB 4-0 PS2 18 (SUTURE) ×1 IMPLANT
SUT PDS AB 1 CTX 36 (SUTURE) ×6 IMPLANT
SUT PROLENE 4 0 RB 1 (SUTURE) ×2
SUT PROLENE 4 0 SH DA (SUTURE) ×3 IMPLANT
SUT PROLENE 4-0 RB1 .5 CRCL 36 (SUTURE) IMPLANT
SUT PROLENE 5 0 C 1 36 (SUTURE) ×9 IMPLANT
SUT PROLENE 7 0 BV 1 (SUTURE) ×2 IMPLANT
SUT PROLENE 7 0 BV1 MDA (SUTURE) ×3 IMPLANT
SUT STEEL 6MS V (SUTURE) ×6 IMPLANT
SUT VIC AB 2-0 CT1 27 (SUTURE) ×1
SUT VIC AB 2-0 CT1 TAPERPNT 27 (SUTURE) IMPLANT
SYSTEM SAHARA CHEST DRAIN ATS (WOUND CARE) ×3 IMPLANT
TAPE CLOTH SURG 4X10 WHT LF (GAUZE/BANDAGES/DRESSINGS) ×1 IMPLANT
TAPE PAPER 2X10 WHT MICROPORE (GAUZE/BANDAGES/DRESSINGS) ×1 IMPLANT
TOWEL GREEN STERILE (TOWEL DISPOSABLE) ×3 IMPLANT
TOWEL GREEN STERILE FF (TOWEL DISPOSABLE) ×3 IMPLANT
TRAY FOLEY SLVR 16FR TEMP STAT (SET/KITS/TRAYS/PACK) ×3 IMPLANT
TUBING LAP HI FLOW INSUFFLATIO (TUBING) ×3 IMPLANT
UNDERPAD 30X36 HEAVY ABSORB (UNDERPADS AND DIAPERS) ×3 IMPLANT
WATER STERILE IRR 1000ML POUR (IV SOLUTION) ×6 IMPLANT

## 2020-12-13 NOTE — Procedures (Signed)
Extubation Procedure Note  Pt extubated following Cardiac wean. Pt awake and following commands. NIF -26, VC 750 mls, Cuff leak +, No stridor.   Patient Details:   Name: Erica Brock DOB: [birth year] MRN: [id number]   Airway Documentation:    Vent end date: (not recorded) Vent end time: (not recorded)   Evaluation  O2 sats: stable throughout Complications: No apparent complications Patient did tolerate procedure well. Bilateral Breath Sounds: Clear,Diminished   Yes  Lianne Bushy 12/13/2020, 11:04 PM

## 2020-12-13 NOTE — Progress Notes (Signed)
RT NOTE:  Cardiac rapid wean initiated. Pt following commands and alert.

## 2020-12-13 NOTE — Progress Notes (Signed)
     [address]       [place] [address]             [phone]       No events Vitals:   12/13/20 0825 12/13/20 0840  BP: (!) 220/93 (!) 215/118  Pulse: 72 70  Resp: 18 13  Temp:    SpO2: 99% 96%   Alert NAD Sinus  EWOB  OR today for CABG  Blanca Carreon O Angely Dietz

## 2020-12-13 NOTE — Progress Notes (Signed)
Bad sample of abg drawn when arrivved to the unit. ABG redrawn. All results reported to Dr. Cliffton Asters. PH 7.46/CO2 51.1/PO2 135/acid-base excess 11/ bicarb 37.2. K 2.9/Ca 1.07. See new orders.

## 2020-12-13 NOTE — Progress Notes (Signed)
PRN 10 mg IV Hydralazine administered (SEE MAR) for BP 215/118.

## 2020-12-13 NOTE — Progress Notes (Signed)
Patient ID: Erica Brock, female   DOB: Mar 26, 1951, 70 y.o.   MRN: [id number]  TCTS Evening Rounds:   Hemodynamically stable   Still asleep on vent  Urine output good  CT output low  CBC    Component Value Date/Time   WBC 14.0 (H) 12/13/2020 1605   RBC 3.53 (L) 12/13/2020 1605   HGB 10.5 (L) 12/13/2020 1606   HGB 12.3 07/20/2013 2345   HCT 31.0 (L) 12/13/2020 1606   HCT 36.0 07/20/2013 2345   PLT 151 12/13/2020 1605   PLT 210 07/20/2013 2345   MCV 88.7 12/13/2020 1605   MCV 91 07/20/2013 2345   MCH 30.9 12/13/2020 1605   MCHC 34.8 12/13/2020 1605   RDW 13.1 12/13/2020 1605   RDW 14.2 07/20/2013 2345   LYMPHSABS 1.8 07/06/2015 0141   MONOABS 0.6 07/06/2015 0141   EOSABS 0.1 07/06/2015 0141   BASOSABS 0.1 07/06/2015 0141     BMET    Component Value Date/Time   NA 147 (H) 12/13/2020 1606   NA 131 (L) 07/20/2013 2345   K 2.9 (L) 12/13/2020 1606   K 4.1 07/20/2013 2345   CL 103 12/13/2020 1434   CL 98 07/20/2013 2345   CO2 23 12/13/2020 0430   CO2 26 07/20/2013 2345   GLUCOSE 124 (H) 12/13/2020 1434   GLUCOSE 134 (H) 07/20/2013 2345   BUN 3 (L) 12/13/2020 1434   BUN 18 07/20/2013 2345   CREATININE 0.40 (L) 12/13/2020 1434   CREATININE 0.67 07/20/2013 2345   CALCIUM 9.1 12/13/2020 0430   CALCIUM 9.6 07/20/2013 2345   GFRNONAA >60 12/13/2020 0430   GFRNONAA >60 07/20/2013 2345   GFRAA >60 07/18/2017 0446   GFRAA >60 07/20/2013 2345     A/P:  Stable postop course. Continue current plans

## 2020-12-13 NOTE — Op Note (Signed)
[address]       [place] [address]             [phone]                                          12/13/2020 Patient:  Erica Brock Pre-Op Dx: Three-vessel coronary disease   NSTEMI   Obstructive sleep apnea   Hypertension   Hyperlipidemia   Diabetes mellitus Post-op Dx: Same Procedure: CABG X 4, LIMA to LAD, reverse saphenous vein graft to PDA, reverse saphenous vein to OM1, reverse saphenous vein graft to first diagonal Endoscopic greater saphenous vein harvest on the right   Surgeon and Role:      * Jarmaine Ehrler, Eliezer Lofts, MD - Primary    *D. Joycelyn Man, PA-C- assisting  Anesthesia  general EBL: 500 ml Blood Administration: 2 units of packed red blood cells Xclamp Time: 60 min Pump Time: 103 min  Drains: 19 F blake drain:  R, L, mediastinal  Wires: None Counts: correct   Indications: 70 yo female with 3V CAD.  She has preserved LV and RV function and no significant valvular disease.    She originally presented with an NSTEMI and was taken for left heart cath and found to have significant disease.  We discussed the surgical revascularization, and she is agreeable to proceed.    Findings: Good LIMA, good vein conduit.  Heavily calcified LAD.  Partially intramyocardial obtuse marginal.  Small PDA.  Good-quality diagonal.  Operative Technique: All invasive lines were placed in pre-op holding.  After the risks, benefits and alternatives were thoroughly discussed, the patient was brought to the operative theatre.  Anesthesia was induced, and the patient was prepped and draped in normal sterile fashion.  An appropriate surgical pause was performed, and pre-operative antibiotics were dosed accordingly.  We began with simultaneous incisions along the right leg for harvesting of the greater saphenous vein and the chest for the sternotomy.  In regards to the sternotomy, this was carried down with bovie cautery, and the sternum was divided with a  reciprocating saw.  Meticulous hemostasis was obtained.  The left internal thoracic artery was exposed and harvested in in pedicled fashion.  The patient was systemically heparinized, and the artery was divided distally, and placed in a papaverine sponge.    The sternal elevator was removed, and a retractor was placed.  The pericardium was divided in the midline and fashioned into a cradle with pericardial stitches.   After we confirmed an appropriate ACT, the ascending aorta was cannulated in standard fashion.  The right atrial appendage was used for venous cannulation site.  Cardiopulmonary bypass was initiated, and the heart retractor was placed. The cross clamp was applied, and a dose of anterograde cardioplegia was given with good arrest of the heart.  We moved to the posterior wall of the heart, and found a good target on the PDA.  An arteriotomy was made, and the vein graft was anastomosed to it in an end to side fashion.  Next we exposed the lateral wall, and found a good target on the obtuse marginal.  An end to side anastomosis with the vein graft was then created.  Next, we exposed the anterior wall of the heart and identified a good target on first diagonal.   An arteriotomy was created.  The vein was anastomosed  in an end to side fashion.  Finally, we exposed a good target on the LAD, and fashioned an end to side anastomosis between it and the LITA.  We began to re-warm, and a re-animation dose of cardioplegia was given.  The heart was de-aired, and the cross clamp was removed.  Meticulous hemostasis was obtained.    A partial occludding clamp was then placed on the ascending aorta, and we created an end to side anastomosis between it and the proximal vein grafts.  The proximal sites were marked with rings.  Hemostasis was obtained, and we separated from cardiopulmonary bypass without event.the heparin was reversed with protamine.  Chest tubes and wires were placed, and the sternum was  re-approximated with with sternal wires.  The soft tissue and skin were re-approximated wth absorbable suture.    The patient tolerated the procedure without any immediate complications, and was transferred to the ICU in guarded condition.  Jessiah Wojnar Keane Scrape

## 2020-12-13 NOTE — Anesthesia Procedure Notes (Addendum)
Central Venous Catheter Insertion Performed by: Marcene Duos, MD, anesthesiologist Start/End5/26/2022 9:05 AM, 12/13/2020 9:15 AM Patient location: Pre-op. Preanesthetic checklist: patient identified, IV checked, site marked, risks and benefits discussed, surgical consent, monitors and equipment checked, pre-op evaluation, timeout performed and anesthesia consent Position: Trendelenburg Lidocaine 1% used for infiltration and patient sedated Hand hygiene performed , maximum sterile barriers used  and Seldinger technique used Catheter size: 8.5 Fr Total catheter length 10. Central line was placed.Sheath introducer Swan type:thermodilution Procedure performed using ultrasound guided technique. Ultrasound Notes:anatomy identified, needle tip was noted to be adjacent to the nerve/plexus identified, no ultrasound evidence of intravascular and/or intraneural injection and image(s) printed for medical record Attempts: 1 Following insertion, line sutured, dressing applied and Biopatch. Post procedure assessment: blood return through all ports, free fluid flow and no air  Patient tolerated the procedure well with no immediate complications. Additional procedure comments: Triple lumen slick catheter inserted through introducer port under sterile conditions.Marland Kitchen

## 2020-12-13 NOTE — Transfer of Care (Signed)
Immediate Anesthesia Transfer of Care Note  Patient: Erica Brock  Procedure(s) Performed: CORONARY ARTERY BYPASS GRAFTING (CABG) TIMES FOUR USING RIGHT GREATER SAPHENOUS VEIN HARVESTED ENDOSCOPICALLY AND LEFT INTERNAL MAMMARY ARTERY. (N/A Chest) TRANSESOPHAGEAL ECHOCARDIOGRAM (TEE) (N/A )  Patient Location: SICU  Anesthesia Type:General  Level of Consciousness: Patient remains intubated per anesthesia plan  Airway & Oxygen Therapy: Patient remains intubated per anesthesia plan and Patient placed on Ventilator (see vital sign flow sheet for setting)  Post-op Assessment: Report given to RN and Post -op Vital signs reviewed and stable  Post vital signs: Reviewed and stable  Last Vitals:  Vitals Value Taken Time  BP 124/67   Temp    Pulse 57   Resp 0 12/13/20 1543  SpO2 98 % 12/13/20 1543  Vitals shown include unvalidated device data.  Last Pain:  Vitals:   12/13/20 0825  TempSrc:   PainSc: 0-No pain      Patients Stated Pain Goal: 0 (12/12/20 2030)  Complications: No complications documented.

## 2020-12-13 NOTE — Brief Op Note (Signed)
12/11/2020 - 12/13/2020  1:53 PM  PATIENT:  Erica Brock  70 y.o. female  PRE-OPERATIVE DIAGNOSIS:  1. S/p NSTEMI 2.CAD  POST-OPERATIVE DIAGNOSIS:  1. S/p NSTEMI 2.CAD  PROCEDURE:  TRANSESOPHAGEAL ECHOCARDIOGRAM (TEE), CORONARY ARTERY BYPASS GRAFTING (CABG) TIMES FOUR (LIMA to LAD, SVG to DIAGONAL, SVG to OM, SVG to PDA) USING RIGHT GREATER SAPHENOUS VEIN HARVESTED ENDOSCOPICALLY AND LEFT INTERNAL MAMMARY ARTERY. RIGHT EVH HARVEST TIME: 45 minutes; RIGHT EVH PREP TIME: 16 minutes  SURGEON:  Surgeon(s) and Role:    Lightfoot, Eliezer Lofts, MD - Primary  PHYSICIAN ASSISTANT: Doree Fudge PA-C  ANESTHESIA:   general  EBL:  Per anesthesia, perfusion record  DRAINS: Chest tubes placed in the mediastinal and pleural spaces   COUNTS CORRECT:  YES  DICTATION: .Dragon Dictation  PLAN OF CARE: Admit to inpatient   PATIENT DISPOSITION:  ICU - intubated and hemodynamically stable.   Delay start of Pharmacological VTE agent (>24hrs) due to surgical blood loss or risk of bleeding: yes  BASELINE WEIGHT: 86.5 kg

## 2020-12-13 NOTE — Anesthesia Procedure Notes (Addendum)
Procedure Name: Intubation Date/Time: 12/13/2020 10:20 AM Performed by: Rande Brunt, CRNA Pre-anesthesia Checklist: Patient identified, Emergency Drugs available, Suction available and Patient being monitored Patient Re-evaluated:Patient Re-evaluated prior to induction Oxygen Delivery Method: Circle System Utilized Preoxygenation: Pre-oxygenation with 100% oxygen Induction Type: IV induction Ventilation: Mask ventilation without difficulty Laryngoscope Size: Mac and 3 Grade View: Grade I Tube type: Oral Tube size: 7.0 mm Number of attempts: 1 Airway Equipment and Method: Stylet and Oral airway Placement Confirmation: ETT inserted through vocal cords under direct vision,  positive ETCO2 and breath sounds checked- equal and bilateral Secured at: 21 cm Tube secured with: Tape Dental Injury: Teeth and Oropharynx as per pre-operative assessment

## 2020-12-13 NOTE — Progress Notes (Signed)
ANTICOAGULATION CONSULT NOTE  Pharmacy Consult for IV Heparin Indication: chest pain/ACS  Allergies  Allergen Reactions  . Prednisone Other (See Comments)    Elevated BP  . Atorvastatin     Muscle aches     Patient Measurements: Total Body Weight: 87.6 kg Height:  64 inches Heparin Dosing Weight: 74.3 kg  Vital Signs: Temp: 97.9 F (36.6 C) (05/26 0617) Temp Source: Oral (05/26 0617) BP: 177/87 (05/26 0617) Pulse Rate: 74 (05/26 0617)  Labs: Recent Labs    12/10/20 1735 12/10/20 2002 12/11/20 0226 12/12/20 0947 12/12/20 1843 12/13/20 0430  HGB  --   --  10.8* 11.8*  --  12.0  HCT  --   --  31.2* 34.9*  --  34.6*  PLT  --   --  294 280  --  278  LABPROT  --   --   --  12.9  --   --   INR  --   --   --  1.0  --   --   HEPARINUNFRC  --   --   --  0.24* 0.43 0.68  CREATININE  --   --  0.73 0.62  --  0.67  TROPONINIHS 260* 329* 253*  --   --   --    Estimated Creatinine Clearance: 68.6 mL/min (by C-G formula based on SCr of 0.67 mg/dL).  Medical History: Past Medical History:  Diagnosis Date  . Anxiety   . Arthritis   . Diabetes mellitus without complication (HCC)   . Diffuse cystic mastopathy 2012  . GERD (gastroesophageal reflux disease)   . Heart murmur    ETT myoview 12/09 (Dr. Juel Burrow) no evidence of ischemia, good exercise tolerance - 9 min. echo (11/10): EF 60-65%, nml RV, PASP 25, nml valves  . Hepatitis    as a child   . HLD (hyperlipidemia)   . HTN (hypertension)    for < 10 years  . Obesity   . OSA (obstructive sleep apnea)    mild; uses CPAP  . Rheumatic fever    as a child   Assessment: 70 yr old woman transferred from [facility] to [facility] for CABG evaluation.Underwent cardiac cath at [facility] 5/24, which revealed severe 3-vessel CAD with heavy calcification.   Pharmacy consulted for IV heparin for CP/ACS 2 hrs after TR band removed (per Waldron Session, [facility] RN, TR band was completely deflated at 1820 PM 5/24). Pt was not on  anticoagulation PTA to [facility]. At [facility], she was on Lovenox 40 mg/day, with last dose on 5/23 at 2114 PM.  Heparin therapeutic, CABG today.  Goal of Therapy:  Heparin level 0.3-0.7 units/ml Monitor platelets by anticoagulation protocol: Yes   Plan:  Heparin 1100 units/h CABG later today   Fredonia Highland, PharmD, BCPS, [facility] Clinical Pharmacist [phone] Please check AMION for all [facility] Pharmacy numbers 12/13/2020

## 2020-12-13 NOTE — Anesthesia Preprocedure Evaluation (Signed)
Anesthesia Evaluation  Patient identified by MRN, date of birth, ID band Patient awake    Reviewed: Allergy & Precautions, NPO status , Patient's Chart, lab work & pertinent test results  Airway Mallampati: II  TM Distance: >3 FB Neck ROM: Full    Dental  (+) Dental Advisory Given   Pulmonary sleep apnea ,    breath sounds clear to auscultation       Cardiovascular hypertension, Pt. on medications + angina + CAD and + Past MI   Rhythm:Regular Rate:Normal     Neuro/Psych negative neurological ROS     GI/Hepatic Neg liver ROS, GERD  ,  Endo/Other  diabetes, Type 2, Oral Hypoglycemic Agents  Renal/GU      Musculoskeletal  (+) Arthritis ,   Abdominal   Peds  Hematology negative hematology ROS (+)   Anesthesia Other Findings   Reproductive/Obstetrics                             Lab Results  Component Value Date   WBC 9.2 12/13/2020   HGB 12.0 12/13/2020   HCT 34.6 (L) 12/13/2020   MCV 89.9 12/13/2020   PLT 278 12/13/2020   Lab Results  Component Value Date   CREATININE 0.67 12/13/2020   BUN 5 (L) 12/13/2020   NA 137 12/13/2020   K 3.4 (L) 12/13/2020   CL 104 12/13/2020   CO2 23 12/13/2020    Anesthesia Physical Anesthesia Plan  ASA: IV  Anesthesia Plan: General   Post-op Pain Management:    Induction: Intravenous  PONV Risk Score and Plan: 3 and Treatment may vary due to age or medical condition, Dexamethasone, Ondansetron and Midazolam  Airway Management Planned: Oral ETT  Additional Equipment: Arterial line, CVP, TEE and Ultrasound Guidance Line Placement  Intra-op Plan:   Post-operative Plan: Post-operative intubation/ventilation  Informed Consent: I have reviewed the patients History and Physical, chart, labs and discussed the procedure including the risks, benefits and alternatives for the proposed anesthesia with the patient or authorized representative who  has indicated his/her understanding and acceptance.     Dental advisory given  Plan Discussed with: CRNA  Anesthesia Plan Comments:         Anesthesia Quick Evaluation

## 2020-12-13 NOTE — Discharge Summary (Addendum)
Physician Discharge Summary       [address]       [place] [address]             [phone]    Patient ID: [name] MRN: [id number] DOB/AGE: [birth year] 71 y.o.  Admit date: 12/11/2020 Discharge date: 12/18/2020  Admission Diagnoses: 1.  Non-ST elevation (NSTEMI) myocardial infarction (HCC) 2. Coronary artery disease involving native coronary artery of native heart with unstable angina pectoris [facility])  Discharge Diagnoses:  1. S/p CABG x 4 2. Expected post op blood loss anemia 3. History of hyperlipidemia associated with type 2 diabetes mellitus (HCC) 4. History of essential hypertension 5. History of controlled type 2 diabetes mellitus without complication, without long-term current use of insulin (HCC) 6. History of GERD (gastroesophageal reflux disease) 7. History of obesity 8. History of OSA (obstructive sleep apnea) 9. History of Rheumatic fever 10. History of anxiety 11. History of arthritis 12. History of DVT/PE 13. History or cystic mastopathy  Consults: None  Procedure (s):  CABG X 4, LIMA to LAD, reverse saphenous vein graft to PDA, reverse saphenous vein to OM1, reverse saphenous vein graft to first diagonal endoscopic greater saphenous vein harvest on the right by Dr. Cliffton Asters on 12/13/2020.  History of Presenting Illness: The patient is a 70 year old female with multiple cardiac risk factors including hypertension, hyperlipidemia, sleep apnea, obesity diabetes mellitus type 2 as well as history of of previous DVT/PE we are asked to see in consideration of coronary artery surgical revascularization.  The patient presented to the emergency department on 523.  She awoke in her usual state of health in the morning, took her multivitamin and shortly after developed an intense nausea followed by vomiting and diarrhea.  After this episode the patient was noted to look lethargic and diaphoretic.  She notes that she also felt lightheaded.  Her  husband called EMS and she was brought to the emergency department.  In the emergency department basic laboratory screens were done as well as cardiac high-sensitivity troponin which was initially elevated at 91.  It subsequently peaked at 329.  She denied chest pain or shortness of breath.  EKG was noted to have no ST-T wave changes.  Due to these findings the patient was admitted for further evaluation treatment to include cardiology consultation.  A stress test was performed and noted to be high risk.  During the test she also experienced some typical anginal symptoms.  She was felt to require further diagnostic evaluation including cardiac catheterization which was done on 12/11/2020.  Findings were notable for severe three-vessel coronary artery disease including a 40% left main coronary artery stenosis, sequential 40% mid and 70 to 80% mid/distal LAD lesions, 50% proximal left circumflex disease at the takeoff of a large OM branch followed by sequential 70 and 99% stenosis in the OM1.  She was noted to have a CTO of the dominant RCA with left-to-right and right to right collateralization.  Please see the full report below.  She does have a history of an echocardiogram done in March of this year showing ejection fraction at that time by estimation 55 to 60%.  There was mild mitral regurgitation noted and trivial/ mild aortic regurgitation. Dr. Cliffton Asters discussed the need for coronary artery bypass grafting surgery. Potential risks, benefits, and complications of the surgery were discussed with the patient and she agreed to proceed with surgery. Pre operative carotid duplex US showed no significant internal carotid artery stenosis bilaterally. Patient underwent a  CABG x 4 by Dr. Cliffton Asters on 12/13/2020.  Brief Hospital Course:  Patient was extubated late the evening of surgery. She remained afebrile and hemodynamically stable. Theone Murdoch, a line, and foley were removed early in her post operative course.  Chest tubes remained for a few days and once output decreased were removed on 05/28. She was started on Lopressor and this was titrated accordingly. She was volume overloaded and diuresed. She was weaned off the Insulin drip. Her pre op HGA1C was 6.3. she likely has pre diabetes. Nutritional information will be provided with paperwork and she will need further surveillance with her medical doctor. She had expected post op blood loss anemia. She did not require a transfusion. She was felt surgically stable for transfer from the ICU to 4E for further convalescence on 05/28. She was oxygenating well on room air. Her wounds are clean, dry, and healing without signs of infection. She has been tolerating a diet and has had a bowel movement. She was hypertensive post op. Lisinopril 40 mg daily was restarted and Hydralazine was added on 05/28. Clonidine was restarted 0.1 mg bid on 05/29. Hydralazine was then stopped. BP did improve. She had runs of SVT, PACs and was more tachycardic on 05/30. Lopressor was increased to 37.5 mg bid on 05/30 and then again to 50 mg bid as still with some tachycardia. As discussed with Dr. Geanie Kenning decrease ec asa to 81 mg daily and stat Plavix as she presented with a NSTEMI. As discussed with Dr. Cliffton Asters, she is felt surgically stable for discharge today.   Latest Vital Signs: Blood pressure 131/75, pulse 97, temperature 98.5 F (36.9 C), temperature source Oral, resp. rate 20, height 5\' 4"  (1.626 m), weight 86.9 kg, SpO2 98 %.  Physical Exam: Cardiovascular: Slightly tachycardic Pulmonary: Slightly diminished left basilar breath sounds Abdomen: Soft, non tender, bowel sounds present. Extremities: Tracebilateral lower extremity edema. Wounds: Sternal wound is clean and dry. Right lower leg wounds are clean and dry.  No erythema or signs of infection.  Discharge Condition: Stable and discharged to home.  Recent laboratory studies:  Lab Results  Component Value Date    WBC 14.3 (H) 12/16/2020   HGB 11.4 (L) 12/16/2020   HCT 34.2 (L) 12/16/2020   MCV 90.0 12/16/2020   PLT 199 12/16/2020   Lab Results  Component Value Date   NA 128 (L) 12/16/2020   K 3.8 12/16/2020   CL 97 (L) 12/16/2020   CO2 23 12/16/2020   CREATININE 0.52 12/16/2020   GLUCOSE 98 12/16/2020      Diagnostic Studies: DG Chest 2 View  Result Date: 12/16/2020 CLINICAL DATA:  70 year old female with history of pneumothorax. EXAM: CHEST - 2 VIEW COMPARISON:  12/15/2020 FINDINGS: The mediastinal contours are within normal limits. Stable cardiomegaly. Similar appearing postsurgical changes after coronary artery bypass graft. Interval removal of bilateral thoracostomy tubes. No pneumothorax. Decreased size of trace residual left pleural effusion. Left basilar subsegmental atelectasis. The right lung is clear. No acute osseous abnormality. Median sternotomy wires in place. IMPRESSION: Trace but smaller residual left pleural effusion.  No pneumothorax. Electronically Signed   By: 12/17/2020 MD   On: 12/16/2020 08:30   DG Chest 2 View  Result Date: 12/15/2020 CLINICAL DATA:  Pneumothorax EXAM: CHEST - 2 VIEW COMPARISON:  12/14/2020 FINDINGS: Interval removal of right IJ venous catheter. Stable left chest tube.  No pneumothorax is seen. Mild left basilar opacity, likely atelectasis with associated small left pleural effusion. The heart is normal  in size. Postsurgical changes related to prior CABG. Median sternotomy. IMPRESSION: Stable left chest tube.  No pneumothorax is seen. Probable small left pleural effusion with atelectasis. Postsurgical changes related to prior CABG. Electronically Signed   By: Charline Bills M.D.   On: 12/15/2020 09:27   DG Chest 2 View  Result Date: 12/12/2020 CLINICAL DATA:  70 year old female with preop chest radiograph. EXAM: CHEST - 2 VIEW COMPARISON:  Chest radiograph dated 07/18/2017 FINDINGS: The lungs are clear. There is no pleural effusion pneumothorax.  Top-normal cardiac size. Atherosclerotic calcification of the aorta. No acute osseous pathology. IMPRESSION: No active cardiopulmonary disease. Electronically Signed   By: Elgie Collard M.D.   On: 12/12/2020 21:17   CARDIAC CATHETERIZATION  Result Date: 12/11/2020 Conclusions: 1. Severe three-vessel coronary artery disease with heavy calcification, including 40% mid LMCA stenosis, sequential 40% mid and 70-80% mid/distal LAD lesions, 50% proximal LCx disease at takeoff of large OM branch followed by sequential 70% and 99% stenoses in OM1, and chronic total occlusion of dominant RCA with left to right and right to right collaterals. 2. Mildly elevated left ventricular filling pressure. Recommendations: 1. Images reviewed with Drs. Gollan and Arida.  Given three-vessel coronary artery disease including subtotal/total occlusions of OM1 and RCA, heavy calcification throughout the coronary arteries, and history of diabetes mellitus, transfer to [facility] for surgical consultation for CABG is recommended. 2. Start IV heparin 2 hours after TR band removal. 3. Obtain echocardiogram. 4. Aggressive secondary prevention. [name], MD [facility]   NM Myocar Multi W/Spect Izetta Dakin Motion / EF  Result Date: 12/11/2020  Abnormal pharmacologic myocardial perfusion stress test.  There is a moderate in size, severe, reversible defect involving the basal and mid anterolateral and inferolateral segments consistent with ischemia.  There is a small in size, severe, fixed mid anteroseptal and apical septal defect consistent with scar.  The left ventricular ejection fraction is mildly decreased (48%).  Attenuation correction CT demonstrates coronary artery calcification and aortic atherosclerosis.  This is a high risk study.    DG Chest Port 1 View  Result Date: 12/14/2020 CLINICAL DATA:  Chest tube.  Status post heart surgery. EXAM: PORTABLE CHEST 1 VIEW COMPARISON:  12/13/2020 FINDINGS: Endotracheal tube  and nasogastric tube have been removed. Left chest tube is stable and overlying the mid left chest. Negative for a left pneumothorax. Densities at left lung base could represent atelectasis. There is a right jugular central line with the tip in the SVC region. Heart size is stable. Median sternotomy and post CABG changes. IMPRESSION: 1. Stable position of the left chest tube without a pneumothorax. 2. Left basilar densities are suggestive for atelectasis and consolidation. Electronically Signed   By: Richarda Overlie M.D.   On: 12/14/2020 08:06   DG Chest Port 1 View  Result Date: 12/13/2020 CLINICAL DATA:  Evaluate for pneumothorax EXAM: PORTABLE CHEST 1 VIEW COMPARISON:  Film from the previous day. FINDINGS: Cardiac shadow is stable. Postsurgical changes are now seen. Endotracheal tube, gastric catheter, right jugular central line, bilateral thoracostomy tubes and mediastinal drain are seen. Lungs are well aerated bilaterally. No pneumothorax is seen. No focal confluent infiltrate is noted. IMPRESSION: Postsurgical change with tubes and lines as described above. No pneumothorax is seen. Electronically Signed   By: Alcide Clever M.D.   On: 12/13/2020 16:27   VAS US DOPPLER PRE CABG  Result Date: 12/12/2020 PREOPERATIVE VASCULAR EVALUATION Patient Name:  Erica Brock  Date of Exam:   12/12/2020 Medical Rec #:  [id number]       Accession #:    [id number] Date of Birth: [birth year]        Patient Gender: F Patient Age:   70Y Exam [facility]  [facility] Procedure:      VAS US DOPPLER PRE CABG Referring Phys: [id number] HARRELL O LIGHTFOOT --------------------------------------------------------------------------------  Indications: Pre-CABG. Performing Technologist: Blanch Media RVS  Examination Guidelines: A complete evaluation includes B-mode imaging, spectral Doppler, color Doppler, and power Doppler as needed of all accessible portions of each vessel. Bilateral testing is considered an integral part of a  complete examination. Limited examinations for reoccurring indications may be performed as noted.  Right Carotid Findings: +----------+--------+--------+--------+------------+--------+           PSV cm/sEDV cm/sStenosisDescribe    Comments +----------+--------+--------+--------+------------+--------+ CCA Prox  80      15              heterogenous         +----------+--------+--------+--------+------------+--------+ CCA Distal63      14              heterogenous         +----------+--------+--------+--------+------------+--------+ ICA Prox  62      23      1-39%   heterogenous         +----------+--------+--------+--------+------------+--------+ ICA Distal44      19                                   +----------+--------+--------+--------+------------+--------+ ECA       80                                           +----------+--------+--------+--------+------------+--------+ Portions of this table do not appear on this page. +----------+--------+-------+--------+------------+           PSV cm/sEDV cmsDescribeArm Pressure +----------+--------+-------+--------+------------+ Subclavian81                                  +----------+--------+-------+--------+------------+ +---------+--------+--+--------+--+---------+ VertebralPSV cm/s30EDV cm/s11Antegrade +---------+--------+--+--------+--+---------+ Left Carotid Findings: +----------+--------+--------+--------+------------+--------+           PSV cm/sEDV cm/sStenosisDescribe    Comments +----------+--------+--------+--------+------------+--------+ CCA Prox  65      14              heterogenous         +----------+--------+--------+--------+------------+--------+ CCA Distal69      21              heterogenous         +----------+--------+--------+--------+------------+--------+ ICA Prox  126     39      1-39%   heterogenous          +----------+--------+--------+--------+------------+--------+ ICA Distal98      32                                   +----------+--------+--------+--------+------------+--------+ ECA       96                                           +----------+--------+--------+--------+------------+--------+ +----------+--------+--------+--------+------------+ SubclavianPSV cm/sEDV cm/sDescribeArm Pressure +----------+--------+--------+--------+------------+  62                                   +----------+--------+--------+--------+------------+ +---------+--------+--+--------+--+---------+ VertebralPSV cm/s57EDV cm/s16Antegrade +---------+--------+--+--------+--+---------+  ABI Findings: +--------+------------------+-----+---------+--------+ Right   Rt Pressure (mmHg)IndexWaveform Comment  +--------+------------------+-----+---------+--------+ Brachial                       triphasic         +--------+------------------+-----+---------+--------+ ATA                            triphasic         +--------+------------------+-----+---------+--------+ PTA                            triphasic         +--------+------------------+-----+---------+--------+ +--------+------------------+-----+---------+-------+ Left    Lt Pressure (mmHg)IndexWaveform Comment +--------+------------------+-----+---------+-------+ Brachial                       triphasic        +--------+------------------+-----+---------+-------+ ATA                            triphasic        +--------+------------------+-----+---------+-------+ PTA                            triphasic        +--------+------------------+-----+---------+-------+  Right Doppler Findings: +--------+--------+-----+---------+--------+ Site    PressureIndexDoppler  Comments +--------+--------+-----+---------+--------+ Brachial             triphasic          +--------+--------+-----+---------+--------+ Radial               triphasic         +--------+--------+-----+---------+--------+ Ulnar                triphasic         +--------+--------+-----+---------+--------+  Left Doppler Findings: +--------+--------+-----+---------+--------+ Site    PressureIndexDoppler  Comments +--------+--------+-----+---------+--------+ Brachial             triphasic         +--------+--------+-----+---------+--------+ Radial               triphasic         +--------+--------+-----+---------+--------+ Ulnar                triphasic         +--------+--------+-----+---------+--------+  Summary: Right Carotid: Velocities in the right ICA are consistent with a 1-39% stenosis. Left Carotid: Velocities in the left ICA are consistent with a 1-39% stenosis. Vertebrals: Bilateral vertebral arteries demonstrate antegrade flow. Right Upper Extremity: Doppler waveforms remain within normal limits with right radial compression. Doppler waveforms decrease 50% with right ulnar compression. Left Upper Extremity: Doppler waveform obliterate with left radial compression. Doppler waveforms remain within normal limits with left ulnar compression.  Electronically signed by Coral Else MD on 12/12/2020 at 9:22:45 PM.    Final    Discharge Instructions    Amb Referral to Cardiac Rehabilitation   Complete by: As directed    Diagnosis:  CABG NSTEMI     CABG X ___: 4   After initial evaluation and assessments completed: Virtual Based Care may be provided alone or in conjunction with Phase  2 Cardiac Rehab based on patient barriers.: Yes     Discharge Medications: Allergies as of 12/18/2020      Reactions   Prednisone Other (See Comments)   Elevated BP   Atorvastatin    Muscle aches       Medication List    TAKE these medications   aspirin EC 81 MG tablet Take 1 tablet (81 mg total) by mouth daily.   cloNIDine 0.1 MG tablet Commonly known as:  CATAPRES Take 1 tablet (0.1 mg total) by mouth 2 (two) times daily.   clopidogrel 75 MG tablet Commonly known as: PLAVIX Take 1 tablet (75 mg total) by mouth daily.   ezetimibe 10 MG tablet Commonly known as: ZETIA TAKE 1 TABLET DAILY   furosemide 40 MG tablet Commonly known as: LASIX Take 1 tablet (40 mg total) by mouth daily. For 4 days then stop.   hydroxychloroquine 200 MG tablet Commonly known as: PLAQUENIL Take 200 mg by mouth 2 (two) times daily.   lisinopril 40 MG tablet Commonly known as: ZESTRIL Take 1 tablet (40 mg total) by mouth daily.   metFORMIN 500 MG 24 hr tablet Commonly known as: GLUCOPHAGE-XR Take 500 mg by mouth at bedtime.   metoprolol tartrate 50 MG tablet Commonly known as: LOPRESSOR Take 1 tablet (50 mg total) by mouth 2 (two) times daily.   montelukast 10 MG tablet Commonly known as: SINGULAIR Take 10 mg by mouth daily as needed.   pantoprazole 40 MG tablet Commonly known as: PROTONIX Take 40 mg by mouth daily.   potassium chloride SA 20 MEQ tablet Commonly known as: KLOR-CON Take 1 tablet (20 mEq total) by mouth daily. For 4 days then stop.   rosuvastatin 40 MG tablet Commonly known as: CRESTOR Take 1 tablet (40 mg total) by mouth daily.   traMADol 50 MG tablet Commonly known as: ULTRAM Take 1 tablet (50 mg total) by mouth every 6 (six) hours as needed for severe pain.      The patient has been discharged on:   1.Beta Blocker:  Yes [ x  ]                              No   [   ]                              If No, reason:  2.Ace Inhibitor/ARB: Yes [ x  ]                                     No  [    ]                                     If No, reason:  3.Statin:   Yes [ x ]                  No  [   ]                  If No, reason:  4.Ecasa:  Yes  [  x ]                  No   [   ]  If No, reason:  Follow Up Appointments:  Follow-up Information    Corliss Skains, MD Follow up on 12/28/2020.    Specialty: Cardiothoracic Surgery Why: Appointment is VIRTUAL, please do NOT go to the office. Dr. Cliffton Asters will contact you. Appointment time is at 3:10 pm Contact information: [address] [place] [address] [phone]        Leim Fabry, MD. Call.   Specialty: Family Medicine Why: for a follow up appointment regarding further surveillance of HGA1C 6.3 (pre diabetes) Contact information: [address] [place] [address] [phone]        Lennon Alstrom, PA-C Follow up on 01/01/2021.   Specialties: Physician Assistant, Cardiology Why: Please arrive 15 minutes early for your 8am post-hospital cardiology appointment Contact information: [address] [place] [address] [phone]               Signed: Lelon Huh [facility] 12/18/2020, 8:55 AM

## 2020-12-13 NOTE — Anesthesia Procedure Notes (Signed)
Arterial Line Insertion Start/End5/26/2022 9:32 AM, 12/13/2020 9:37 AM Performed by: Marcene Duos, MD, anesthesiologist  Patient location: Pre-op. Preanesthetic checklist: patient identified, IV checked, site marked, risks and benefits discussed, surgical consent, monitors and equipment checked, pre-op evaluation, timeout performed and anesthesia consent Lidocaine 1% used for infiltration Right, radial was placed Catheter size: 20 Fr Hand hygiene performed  and maximum sterile barriers used   Attempts: 1 Procedure performed using ultrasound guided technique. Ultrasound Notes:anatomy identified, needle tip was noted to be adjacent to the nerve/plexus identified, no ultrasound evidence of intravascular and/or intraneural injection and image(s) printed for medical record Following insertion, dressing applied and Biopatch. Post procedure assessment: normal and unchanged  Patient tolerated the procedure well with no immediate complications.

## 2020-12-14 ENCOUNTER — Inpatient Hospital Stay (HOSPITAL_COMMUNITY): Payer: Medicare HMO

## 2020-12-14 ENCOUNTER — Encounter (HOSPITAL_COMMUNITY): Payer: Self-pay | Admitting: Thoracic Surgery (Cardiothoracic Vascular Surgery)

## 2020-12-14 ENCOUNTER — Other Ambulatory Visit (HOSPITAL_COMMUNITY): Payer: Self-pay

## 2020-12-14 LAB — CBC
HCT: 30.4 % — ABNORMAL LOW (ref 36.0–46.0)
HCT: 31.6 % — ABNORMAL LOW (ref 36.0–46.0)
Hemoglobin: 10.4 g/dL — ABNORMAL LOW (ref 12.0–15.0)
Hemoglobin: 10.6 g/dL — ABNORMAL LOW (ref 12.0–15.0)
MCH: 30.8 pg (ref 26.0–34.0)
MCH: 30.2 pg (ref 26.0–34.0)
MCHC: 34.2 g/dL (ref 30.0–36.0)
MCHC: 33.5 g/dL (ref 30.0–36.0)
MCV: 89.9 fL (ref 80.0–100.0)
MCV: 90 fL (ref 80.0–100.0)
Platelets: 160 10*3/uL (ref 150–400)
Platelets: 165 10*3/uL (ref 150–400)
RBC: 3.38 MIL/uL — ABNORMAL LOW (ref 3.87–5.11)
RBC: 3.51 MIL/uL — ABNORMAL LOW (ref 3.87–5.11)
RDW: 14 % (ref 11.5–15.5)
RDW: 14.3 % (ref 11.5–15.5)
WBC: 13.3 10*3/uL — ABNORMAL HIGH (ref 4.0–10.5)
WBC: 15.5 10*3/uL — ABNORMAL HIGH (ref 4.0–10.5)
nRBC: 0 % (ref 0.0–0.2)
nRBC: 0 % (ref 0.0–0.2)

## 2020-12-14 LAB — POCT I-STAT 7, (LYTES, BLD GAS, ICA,H+H)
Acid-Base Excess: 1 mmol/L (ref 0.0–2.0)
Bicarbonate: 25.3 mmol/L (ref 20.0–28.0)
Calcium, Ion: 1.17 mmol/L (ref 1.15–1.40)
HCT: 30 % — ABNORMAL LOW (ref 36.0–46.0)
Hemoglobin: 10.2 g/dL — ABNORMAL LOW (ref 12.0–15.0)
O2 Saturation: 98 %
Patient temperature: 98.6
Potassium: 3.6 mmol/L (ref 3.5–5.1)
Sodium: 138 mmol/L (ref 135–145)
TCO2: 27 mmol/L (ref 22–32)
pCO2 arterial: 39.6 mmHg (ref 32.0–48.0)
pH, Arterial: 7.414 (ref 7.350–7.450)
pO2, Arterial: 104 mmHg (ref 83.0–108.0)

## 2020-12-14 LAB — BASIC METABOLIC PANEL
Anion gap: 10 (ref 5–15)
Anion gap: 8 (ref 5–15)
BUN: 7 mg/dL — ABNORMAL LOW (ref 8–23)
BUN: 6 mg/dL — ABNORMAL LOW (ref 8–23)
CO2: 23 mmol/L (ref 22–32)
CO2: 24 mmol/L (ref 22–32)
Calcium: 8.3 mg/dL — ABNORMAL LOW (ref 8.9–10.3)
Calcium: 8.5 mg/dL — ABNORMAL LOW (ref 8.9–10.3)
Chloride: 102 mmol/L (ref 98–111)
Chloride: 101 mmol/L (ref 98–111)
Creatinine, Ser: 0.62 mg/dL (ref 0.44–1.00)
Creatinine, Ser: 0.69 mg/dL (ref 0.44–1.00)
GFR, Estimated: >60 mL/min (ref >60)
GFR, Estimated: >60 mL/min (ref >60)
Glucose, Bld: 111 mg/dL — ABNORMAL HIGH (ref 70–99)
Glucose, Bld: 129 mg/dL — ABNORMAL HIGH (ref 70–99)
Potassium: 3.8 mmol/L (ref 3.5–5.1)
Potassium: 3.6 mmol/L (ref 3.5–5.1)
Sodium: 135 mmol/L (ref 135–145)
Sodium: 133 mmol/L — ABNORMAL LOW (ref 135–145)

## 2020-12-14 LAB — GLUCOSE, CAPILLARY
Glucose-Capillary: 101 mg/dL — ABNORMAL HIGH (ref 70–99)
Glucose-Capillary: 106 mg/dL — ABNORMAL HIGH (ref 70–99)
Glucose-Capillary: 109 mg/dL — ABNORMAL HIGH (ref 70–99)
Glucose-Capillary: 111 mg/dL — ABNORMAL HIGH (ref 70–99)
Glucose-Capillary: 112 mg/dL — ABNORMAL HIGH (ref 70–99)
Glucose-Capillary: 113 mg/dL — ABNORMAL HIGH (ref 70–99)
Glucose-Capillary: 116 mg/dL — ABNORMAL HIGH (ref 70–99)
Glucose-Capillary: 121 mg/dL — ABNORMAL HIGH (ref 70–99)
Glucose-Capillary: 128 mg/dL — ABNORMAL HIGH (ref 70–99)
Glucose-Capillary: 129 mg/dL — ABNORMAL HIGH (ref 70–99)
Glucose-Capillary: 134 mg/dL — ABNORMAL HIGH (ref 70–99)
Glucose-Capillary: 93 mg/dL (ref 70–99)

## 2020-12-14 LAB — MAGNESIUM
Magnesium: 3 mg/dL — ABNORMAL HIGH (ref 1.7–2.4)
Magnesium: 2.5 mg/dL — ABNORMAL HIGH (ref 1.7–2.4)

## 2020-12-14 MED ORDER — FUROSEMIDE 10 MG/ML IJ SOLN
40.0000 mg | Freq: Once | INTRAMUSCULAR | Status: AC
  Administered 2020-12-14: 40 mg via INTRAVENOUS
  Filled 2020-12-14: qty 4

## 2020-12-14 MED ORDER — METOPROLOL TARTRATE 25 MG PO TABS
25.0000 mg | ORAL_TABLET | Freq: Two times a day (BID) | ORAL | Status: DC
  Administered 2020-12-14 – 2020-12-17 (×7): 25 mg via ORAL
  Filled 2020-12-14 (×7): qty 1

## 2020-12-14 MED ORDER — SODIUM CHLORIDE 0.9 % IV SOLN: 250.0000 mL | INTRAVENOUS | Status: DC | PRN

## 2020-12-14 MED ORDER — POTASSIUM CHLORIDE 10 MEQ/50ML IV SOLN
10.0000 meq | INTRAVENOUS | Status: AC
  Administered 2020-12-14 (×2): 10 meq via INTRAVENOUS
  Filled 2020-12-14 (×2): qty 50

## 2020-12-14 MED ORDER — ENOXAPARIN SODIUM 40 MG/0.4ML IJ SOSY
40.0000 mg | PREFILLED_SYRINGE | Freq: Every day | INTRAMUSCULAR | Status: DC
  Administered 2020-12-14 – 2020-12-17 (×4): 40 mg via SUBCUTANEOUS
  Filled 2020-12-14 (×4): qty 0.4

## 2020-12-14 MED ORDER — SODIUM CHLORIDE 0.9% FLUSH
3.0000 mL | Freq: Two times a day (BID) | INTRAVENOUS | Status: DC
  Administered 2020-12-14 – 2020-12-18 (×8): 3 mL via INTRAVENOUS

## 2020-12-14 MED ORDER — SODIUM CHLORIDE 0.9% FLUSH: 3.0000 mL | INTRAVENOUS | Status: DC | PRN

## 2020-12-14 MED ORDER — ~~LOC~~ CARDIAC SURGERY, PATIENT & FAMILY EDUCATION: Freq: Once | Status: AC

## 2020-12-14 MED ORDER — POTASSIUM CHLORIDE 20 MEQ PO PACK
20.0000 meq | PACK | ORAL | Status: AC
Start: 2020-12-14 — End: 2020-12-15
  Administered 2020-12-14 (×2): 20 meq
  Filled 2020-12-14 (×3): qty 1

## 2020-12-14 MED ORDER — INSULIN ASPART 100 UNIT/ML IJ SOLN
0.0000 [IU] | INTRAMUSCULAR | Status: DC
  Administered 2020-12-14: 2 [IU] via SUBCUTANEOUS

## 2020-12-14 MED FILL — Potassium Chloride Inj 2 mEq/ML: INTRAVENOUS | Qty: 40 | Status: AC

## 2020-12-14 MED FILL — Heparin Sodium (Porcine) Inj 1000 Unit/ML: INTRAMUSCULAR | Qty: 30 | Status: AC

## 2020-12-14 MED FILL — Lidocaine HCl Local Preservative Free (PF) Inj 2%: INTRAMUSCULAR | Qty: 15 | Status: AC

## 2020-12-14 NOTE — Anesthesia Postprocedure Evaluation (Signed)
Anesthesia Post Note  Patient: Erica Brock  Procedure(s) Performed: CORONARY ARTERY BYPASS GRAFTING (CABG) TIMES FOUR USING RIGHT GREATER SAPHENOUS VEIN HARVESTED ENDOSCOPICALLY AND LEFT INTERNAL MAMMARY ARTERY. (N/A Chest) TRANSESOPHAGEAL ECHOCARDIOGRAM (TEE) (N/A )     Patient location during evaluation: SICU Anesthesia Type: General Level of consciousness: sedated Pain management: pain level controlled Vital Signs Assessment: post-procedure vital signs reviewed and stable Respiratory status: patient remains intubated per anesthesia plan Cardiovascular status: stable Postop Assessment: no apparent nausea or vomiting Anesthetic complications: no   No complications documented.  Last Vitals:  Vitals:   12/14/20 0800 12/14/20 0904  BP: (!) 157/78 (!) 173/82  Pulse:  77  Resp: 20   Temp: 37.3 C   SpO2: 98%     Last Pain:  Vitals:   12/14/20 0800  TempSrc:   PainSc: 5                  Kennieth Rad

## 2020-12-14 NOTE — Progress Notes (Signed)
      [address]       [place] [address]             [phone]      POD # 1  stable day  BP (!) 146/79   Pulse 77   Temp 98.3 F (36.8 C)   Resp 20   Ht 5\' 4"  (1.626 m)   Wt 88.9 kg   SpO2 96%   BMI 33.64 kg/m   Intake/Output Summary (Last 24 hours) at 12/14/2020 1706 Last data filed at 12/14/2020 1600 Gross per 24 hour  Intake 1667.2 ml  Output 3385 ml  Net -1717.8 ml  K= 3.6, creatinine 0.69 Hct= 31  For transfer to 4E this evening  12/16/2020 C. Viviann Spare, MD [facility] [phone]

## 2020-12-14 NOTE — Progress Notes (Signed)
      [address]       [place] [address]             [phone]                 1 Day Post-Op Procedure(s) (LRB): CORONARY ARTERY BYPASS GRAFTING (CABG) TIMES FOUR USING RIGHT GREATER SAPHENOUS VEIN HARVESTED ENDOSCOPICALLY AND LEFT INTERNAL MAMMARY ARTERY. (N/A) TRANSESOPHAGEAL ECHOCARDIOGRAM (TEE) (N/A)   Events: No events Extubated at 11p _______________________________________________________________ Vitals: BP 138/79   Pulse 72   Temp 99.14 F (37.3 C)   Resp (!) 6   Ht 5\' 4"  (1.626 m)   Wt 88.9 kg   SpO2 97%   BMI 33.64 kg/m   - Neuro: alert NAD  - Cardiovascular: sinus  Drips: none.   CVP:  [5 mmHg-12 mmHg] 8 mmHg  - Pulm: EWOB  ABG    Component Value Date/Time   PHART 7.414 12/14/2020 0021   PCO2ART 39.6 12/14/2020 0021   PO2ART 104 12/14/2020 0021   HCO3 25.3 12/14/2020 0021   TCO2 27 12/14/2020 0021   ACIDBASEDEF 0.4 12/12/2020 2330   O2SAT 98.0 12/14/2020 0021    - Abd: ND - Extremity: warm  .Intake/Output      05/26 0701 05/27 0700   I.V. (mL/kg) 3202.8 (36)   Blood 810   IV Piggyback 1583.8   Total Intake(mL/kg) 5596.6 (63)   Urine (mL/kg/hr) 6270 (2.9)   Blood 345   Chest Tube 340   Total Output 6955   Net -1358.4          _______________________________________________________________ Labs: CBC Latest Ref Rng & Units 12/14/2020 12/14/2020 12/13/2020  WBC 4.0 - 10.5 K/uL 13.3(H) - -  Hemoglobin 12.0 - 15.0 g/dL 10.4(L) 10.2(L) 10.2(L)  Hematocrit 36.0 - 46.0 % 30.4(L) 30.0(L) 30.0(L)  Platelets 150 - 400 K/uL 160 - -   CMP Latest Ref Rng & Units 12/14/2020 12/14/2020 12/13/2020  Glucose 70 - 99 mg/dL 12/15/2020) - -  BUN 8 - 23 mg/dL 7(L) - -  Creatinine 185(U - 1.00 mg/dL 3.14 - -  Sodium 9.70 - 145 mmol/L 135 138 138  Potassium 3.5 - 5.1 mmol/L 3.8 3.6 3.8  Chloride 98 - 111 mmol/L 102 - -  CO2 22 - 32 mmol/L 23 - -  Calcium 8.9 - 10.3 mg/dL 8.3(L) - -  Total Protein 6.5 - 8.1 g/dL - - -  Total Bilirubin 0.3  - 1.2 mg/dL - - -  Alkaline Phos 38 - 126 U/L - - -  AST 15 - 41 U/L - - -  ALT 0 - 44 U/L - - -    CXR: Left effusion, PV congestion  _______________________________________________________________  Assessment and Plan: POD 1 s/p CABG  Neuro: pain controlled CV: on a/s/bb.  Will titrate BB.   Pulm: pulm toilet.  Low CT output.  Will ambulated today Renal: stable.  Will diurese today GI: advancing diet Heme: stable ID: [email or web address] Endo: SSI Dispo: floor this afternoon.   263 12/14/2020 6:21 AM

## 2020-12-14 NOTE — Discharge Instructions (Signed)
Discharge Instructions:  1. You may shower, please wash incisions daily with soap and water and keep dry.  If you wish to cover wounds with dressing you may do so but please keep clean and change daily.  No tub baths or swimming until incisions have completely healed.  If your incisions become red or develop any drainage please call our office at [phone]  2. No Driving until cleared by Dr. Lightfoot's office and you are no longer using narcotic pain medications  3. Monitor your weight daily.. Please use the same scale and weigh at same time... If you gain 5-10 lbs in 48 hours with associated lower extremity swelling, please contact our office at [phone]  4. Fever of 101.5 for at least 24 hours with no source, please contact our office at [phone]   Prediabetes Eating Plan Prediabetes is a condition that causes blood sugar (glucose) levels to be higher than normal. This increases the risk for developing type 2 diabetes (type 2 diabetes mellitus). Working with a health care provider or nutrition specialist (dietitian) to make diet and lifestyle changes can help prevent the onset of diabetes. These changes may help you:  Control your blood glucose levels.  Improve your cholesterol levels.  Manage your blood pressure. What are tips for following this plan? Reading food labels  Read food labels to check the amount of fat, salt (sodium), and sugar in prepackaged foods. Avoid foods that have: ? Saturated fats. ? Trans fats. ? Added sugars.  Avoid foods that have more than 300 milligrams (mg) of sodium per serving. Limit your sodium intake to less than 2,300 mg each day. Shopping  Avoid buying pre-made and processed foods.  Avoid buying drinks with added sugar. Cooking  Cook with olive oil. Do not use butter, lard, or ghee.  Bake, broil, grill, steam, or boil foods. Avoid frying. Meal planning  Work with your dietitian to create an eating plan that is right for you. This  may include tracking how many calories you take in each day. Use a food diary, notebook, or mobile application to track what you eat at each meal.  Consider following a Mediterranean diet. This includes: ? Eating several servings of fresh fruits and vegetables each day. ? Eating fish at least twice a week. ? Eating one serving each day of whole grains, beans, nuts, and seeds. ? Using olive oil instead of other fats. ? Limiting alcohol. ? Limiting red meat. ? Using nonfat or low-fat dairy products.  Consider following a plant-based diet. This includes dietary choices that focus on eating mostly vegetables and fruit, grains, beans, nuts, and seeds.  If you have high blood pressure, you may need to limit your sodium intake or follow a diet such as the DASH (Dietary Approaches to Stop Hypertension) eating plan. The DASH diet aims to lower high blood pressure.   Lifestyle  Set weight loss goals with help from your health care team. It is recommended that most people with prediabetes lose 7% of their body weight.  Exercise for at least 30 minutes 5 or more days a week.  Attend a support group or seek support from a mental health counselor.  Take over-the-counter and prescription medicines only as told by your health care provider. What foods are recommended? Fruits Berries. Bananas. Apples. Oranges. Grapes. Papaya. Mango. Pomegranate. Kiwi. Grapefruit. Cherries. Vegetables Lettuce. Spinach. Peas. Beets. Cauliflower. Cabbage. Broccoli. Carrots. Tomatoes. Squash. Eggplant. Herbs. Peppers. Onions. Cucumbers. Brussels sprouts. Grains Whole grains, such as whole-wheat or whole-grain breads,   crackers, cereals, and pasta. Unsweetened oatmeal. Bulgur. Barley. Quinoa. Brown rice. Corn or whole-wheat flour tortillas or taco shells. Meats and other proteins Seafood. Poultry without skin. Lean cuts of pork and beef. Tofu. Eggs. Nuts. Beans. Dairy Low-fat or fat-free dairy products, such as yogurt,  cottage cheese, and cheese. Beverages Water. Tea. Coffee. Sugar-free or diet soda. Seltzer water. Low-fat or nonfat milk. Milk alternatives, such as soy or almond milk. Fats and oils Olive oil. Canola oil. Sunflower oil. Grapeseed oil. Avocado. Walnuts. Sweets and desserts Sugar-free or low-fat pudding. Sugar-free or low-fat ice cream and other frozen treats. Seasonings and condiments Herbs. Sodium-free spices. Mustard. Relish. Low-salt, low-sugar ketchup. Low-salt, low-sugar barbecue sauce. Low-fat or fat-free mayonnaise. The items listed above may not be a complete list of recommended foods and beverages. Contact a dietitian for more information. What foods are not recommended? Fruits Fruits canned with syrup. Vegetables Canned vegetables. Frozen vegetables with butter or cream sauce. Grains Refined white flour and flour products, such as bread, pasta, snack foods, and cereals. Meats and other proteins Fatty cuts of meat. Poultry with skin. Breaded or fried meat. Processed meats. Dairy Full-fat yogurt, cheese, or milk. Beverages Sweetened drinks, such as iced tea and soda. Fats and oils Butter. Lard. Ghee. Sweets and desserts Baked goods, such as cake, cupcakes, pastries, cookies, and cheesecake. Seasonings and condiments Spice mixes with added salt. Ketchup. Barbecue sauce. Mayonnaise. The items listed above may not be a complete list of foods and beverages that are not recommended. Contact a dietitian for more information. Where to find more information  American Diabetes Association: www.diabetes.org Summary  You may need to make diet and lifestyle changes to help prevent the onset of diabetes. These changes can help you control blood sugar, improve cholesterol levels, and manage blood pressure.  Set weight loss goals with help from your health care team. It is recommended that most people with prediabetes lose 7% of their body weight.  Consider following a Mediterranean  diet. This includes eating plenty of fresh fruits and vegetables, whole grains, beans, nuts, seeds, fish, and low-fat dairy, and using olive oil instead of other fats. This information is not intended to replace advice given to you by your health care provider. Make sure you discuss any questions you have with your health care provider. Document Revised: 10/06/2019 Document Reviewed: 10/06/2019 Elsevier Patient Education  2021 Elsevier Inc.    Prediabetes Eating Plan Prediabetes is a condition that causes blood sugar (glucose) levels to be higher than normal. This increases the risk for developing type 2 diabetes (type 2 diabetes mellitus). Working with a health care provider or nutrition specialist (dietitian) to make diet and lifestyle changes can help prevent the onset of diabetes. These changes may help you:  Control your blood glucose levels.  Improve your cholesterol levels.  Manage your blood pressure. What are tips for following this plan? Reading food labels  Read food labels to check the amount of fat, salt (sodium), and sugar in prepackaged foods. Avoid foods that have: ? Saturated fats. ? Trans fats. ? Added sugars.  Avoid foods that have more than 300 milligrams (mg) of sodium per serving. Limit your sodium intake to less than 2,300 mg each day. Shopping  Avoid buying pre-made and processed foods.  Avoid buying drinks with added sugar. Cooking  Cook with olive oil. Do not use butter, lard, or ghee.  Bake, broil, grill, steam, or boil foods. Avoid frying. Meal planning  Work with your dietitian to create an eating  plan that is right for you. This may include tracking how many calories you take in each day. Use a food diary, notebook, or mobile application to track what you eat at each meal.  Consider following a Mediterranean diet. This includes: ? Eating several servings of fresh fruits and vegetables each day. ? Eating fish at least twice a week. ? Eating one  serving each day of whole grains, beans, nuts, and seeds. ? Using olive oil instead of other fats. ? Limiting alcohol. ? Limiting red meat. ? Using nonfat or low-fat dairy products.  Consider following a plant-based diet. This includes dietary choices that focus on eating mostly vegetables and fruit, grains, beans, nuts, and seeds.  If you have high blood pressure, you may need to limit your sodium intake or follow a diet such as the DASH (Dietary Approaches to Stop Hypertension) eating plan. The DASH diet aims to lower high blood pressure.   Lifestyle  Set weight loss goals with help from your health care team. It is recommended that most people with prediabetes lose 7% of their body weight.  Exercise for at least 30 minutes 5 or more days a week.  Attend a support group or seek support from a mental health counselor.  Take over-the-counter and prescription medicines only as told by your health care provider. What foods are recommended? Fruits Berries. Bananas. Apples. Oranges. Grapes. Papaya. Mango. Pomegranate. Kiwi. Grapefruit. Cherries. Vegetables Lettuce. Spinach. Peas. Beets. Cauliflower. Cabbage. Broccoli. Carrots. Tomatoes. Squash. Eggplant. Herbs. Peppers. Onions. Cucumbers. Brussels sprouts. Grains Whole grains, such as whole-wheat or whole-grain breads, crackers, cereals, and pasta. Unsweetened oatmeal. Bulgur. Barley. Quinoa. Brown rice. Corn or whole-wheat flour tortillas or taco shells. Meats and other proteins Seafood. Poultry without skin. Lean cuts of pork and beef. Tofu. Eggs. Nuts. Beans. Dairy Low-fat or fat-free dairy products, such as yogurt, cottage cheese, and cheese. Beverages Water. Tea. Coffee. Sugar-free or diet soda. Seltzer water. Low-fat or nonfat milk. Milk alternatives, such as soy or almond milk. Fats and oils Olive oil. Canola oil. Sunflower oil. Grapeseed oil. Avocado. Walnuts. Sweets and desserts Sugar-free or low-fat pudding. Sugar-free or  low-fat ice cream and other frozen treats. Seasonings and condiments Herbs. Sodium-free spices. Mustard. Relish. Low-salt, low-sugar ketchup. Low-salt, low-sugar barbecue sauce. Low-fat or fat-free mayonnaise. The items listed above may not be a complete list of recommended foods and beverages. Contact a dietitian for more information. What foods are not recommended? Fruits Fruits canned with syrup. Vegetables Canned vegetables. Frozen vegetables with butter or cream sauce. Grains Refined white flour and flour products, such as bread, pasta, snack foods, and cereals. Meats and other proteins Fatty cuts of meat. Poultry with skin. Breaded or fried meat. Processed meats. Dairy Full-fat yogurt, cheese, or milk. Beverages Sweetened drinks, such as iced tea and soda. Fats and oils Butter. Lard. Ghee. Sweets and desserts Baked goods, such as cake, cupcakes, pastries, cookies, and cheesecake. Seasonings and condiments Spice mixes with added salt. Ketchup. Barbecue sauce. Mayonnaise. The items listed above may not be a complete list of foods and beverages that are not recommended. Contact a dietitian for more information. Where to find more information  American Diabetes Association: www.diabetes.org Summary  You may need to make diet and lifestyle changes to help prevent the onset of diabetes. These changes can help you control blood sugar, improve cholesterol levels, and manage blood pressure.  Set weight loss goals with help from your health care team. It is recommended that most people with prediabetes lose 7%  of their body weight.  Consider following a Mediterranean diet. This includes eating plenty of fresh fruits and vegetables, whole grains, beans, nuts, seeds, fish, and low-fat dairy, and using olive oil instead of other fats. This information is not intended to replace advice given to you by your health care provider. Make sure you discuss any questions you have with your health  care provider. Document Revised: 10/06/2019 Document Reviewed: 10/06/2019 Elsevier Patient Education  2021 Elsevier Inc.   5. Activity- up as tolerated, please walk at least 3 times per day.  Avoid strenuous activity, no lifting, pushing, or pulling with your arms over 8-10 lbs for a minimum of 6 weeks  6. If any questions or concerns arise, please do not hesitate to contact our office at [phone]

## 2020-12-14 NOTE — TOC Benefit Eligibility Note (Signed)
Patient Advocate Encounter  Insurance verification completed.    The patient is currently admitted and upon discharge could be taking Jardiance 10 mg.  The current 30 day co-pay is, $47.00.   The patient is currently admitted and upon discharge could be taking Farxiga 10 mg.  The current 30 day co-pay is, $47.00.   The patient is insured through [employer] Medicare Part D     Tala Eber, CPhT Pharmacy Patient Advocate Specialist [place] [facility] Team Direct Number: [phone]  Fax: [phone]        

## 2020-12-15 ENCOUNTER — Inpatient Hospital Stay (HOSPITAL_COMMUNITY): Payer: Medicare HMO

## 2020-12-15 LAB — CBC
HCT: 33.3 % — ABNORMAL LOW (ref 36.0–46.0)
Hemoglobin: 11 g/dL — ABNORMAL LOW (ref 12.0–15.0)
MCH: 30 pg (ref 26.0–34.0)
MCHC: 33 g/dL (ref 30.0–36.0)
MCV: 90.7 fL (ref 80.0–100.0)
Platelets: 188 10*3/uL (ref 150–400)
RBC: 3.67 MIL/uL — ABNORMAL LOW (ref 3.87–5.11)
RDW: 14 % (ref 11.5–15.5)
WBC: 17.1 10*3/uL — ABNORMAL HIGH (ref 4.0–10.5)
nRBC: 0 % (ref 0.0–0.2)

## 2020-12-15 LAB — GLUCOSE, CAPILLARY
Glucose-Capillary: 122 mg/dL — ABNORMAL HIGH (ref 70–99)
Glucose-Capillary: 133 mg/dL — ABNORMAL HIGH (ref 70–99)
Glucose-Capillary: 124 mg/dL — ABNORMAL HIGH (ref 70–99)
Glucose-Capillary: 172 mg/dL — ABNORMAL HIGH (ref 70–99)
Glucose-Capillary: 117 mg/dL — ABNORMAL HIGH (ref 70–99)

## 2020-12-15 LAB — BASIC METABOLIC PANEL
Anion gap: 9 (ref 5–15)
BUN: 9 mg/dL (ref 8–23)
CO2: 23 mmol/L (ref 22–32)
Calcium: 8.4 mg/dL — ABNORMAL LOW (ref 8.9–10.3)
Chloride: 99 mmol/L (ref 98–111)
Creatinine, Ser: 0.63 mg/dL (ref 0.44–1.00)
GFR, Estimated: >60 mL/min (ref >60)
Glucose, Bld: 132 mg/dL — ABNORMAL HIGH (ref 70–99)
Potassium: 4.2 mmol/L (ref 3.5–5.1)
Sodium: 131 mmol/L — ABNORMAL LOW (ref 135–145)

## 2020-12-15 MED ORDER — POTASSIUM CHLORIDE CRYS ER 20 MEQ PO TBCR
20.0000 meq | EXTENDED_RELEASE_TABLET | Freq: Every day | ORAL | Status: DC
  Administered 2020-12-15: 20 meq via ORAL
  Filled 2020-12-15: qty 1

## 2020-12-15 MED ORDER — METFORMIN HCL ER 500 MG PO TB24
500.0000 mg | ORAL_TABLET | Freq: Every day | ORAL | Status: DC
  Administered 2020-12-15 – 2020-12-17 (×3): 500 mg via ORAL
  Filled 2020-12-15 (×3): qty 1

## 2020-12-15 MED ORDER — FUROSEMIDE 40 MG PO TABS
40.0000 mg | ORAL_TABLET | Freq: Every day | ORAL | Status: DC
  Administered 2020-12-15 – 2020-12-18 (×4): 40 mg via ORAL
  Filled 2020-12-15 (×4): qty 1

## 2020-12-15 MED ORDER — HYDRALAZINE HCL 25 MG PO TABS
25.0000 mg | ORAL_TABLET | Freq: Two times a day (BID) | ORAL | Status: DC
  Administered 2020-12-15: 25 mg via ORAL
  Filled 2020-12-15: qty 1

## 2020-12-15 MED ORDER — INSULIN ASPART 100 UNIT/ML IJ SOLN
0.0000 [IU] | Freq: Three times a day (TID) | INTRAMUSCULAR | Status: DC
  Administered 2020-12-15: 3 [IU] via SUBCUTANEOUS
  Administered 2020-12-16 (×2): 2 [IU] via SUBCUTANEOUS
  Administered 2020-12-17: 3 [IU] via SUBCUTANEOUS

## 2020-12-15 MED ORDER — LISINOPRIL 10 MG PO TABS
20.0000 mg | ORAL_TABLET | Freq: Two times a day (BID) | ORAL | Status: DC
  Administered 2020-12-15 – 2020-12-18 (×7): 20 mg via ORAL
  Filled 2020-12-15 (×7): qty 2

## 2020-12-15 NOTE — Progress Notes (Signed)
Mobility Specialist - Progress Note   12/15/20 1550  Mobility  Activity Ambulated in hall  Level of Assistance Minimal assist, patient does 75% or more  Assistive Device Front wheel walker  Distance Ambulated (ft) 140 ft (80 ft x 1, 40 ft x 1)  Mobility Ambulated with assistance in hallway  Mobility Response Tolerated well  Mobility performed by Mobility specialist  $Mobility charge 1 Mobility   Pre-mobility: 87 HR, 184/100 BP During mobility: 104 HR Post-mobility: 89 HR, 195/105 BP  Pt required one seated due to bilateral LE weakness. SpO2 >90% throughout on RA. Pt to recliner after walk, call bell at side and husband in room. RN aware of BP.   Erica Brock Mobility Specialist Mobility Specialist Phone: [phone]

## 2020-12-15 NOTE — Progress Notes (Signed)
Patient with no urine output since transfer from 2 heart. Patient does not have urge to urinate, bladder scanned x 2 with no detectable volume, reported the same to oncoming nurse, who will keep bladder scanner at bedside and monitor closely.

## 2020-12-15 NOTE — Progress Notes (Signed)
Mobility Specialist - Progress Note   12/15/20 1751  Mobility  Activity Transferred:  Chair to bed  Level of Assistance Minimal assist, patient does 75% or more  Assistive Device Front wheel walker  Distance Ambulated (ft) 4 ft  Mobility Ambulated with assistance in room  Mobility Response Tolerated well  Mobility performed by Mobility specialist  $Mobility charge 1 Mobility   Pt able to stand from recliner w/ min assist after 2 trials. She was asx throughout. Pt in bed w/ call bell at side and husband in room.   Mamie Levers Mobility Specialist Mobility Specialist Phone: [phone]

## 2020-12-15 NOTE — Progress Notes (Addendum)
      [address]       [place] [address]             [phone]        2 Days Post-Op Procedure(s) (LRB): CORONARY ARTERY BYPASS GRAFTING (CABG) TIMES FOUR USING RIGHT GREATER SAPHENOUS VEIN HARVESTED ENDOSCOPICALLY AND LEFT INTERNAL MAMMARY ARTERY. (N/A) TRANSESOPHAGEAL ECHOCARDIOGRAM (TEE) (N/A)  Subjective: Patient just finished a walk. She feels hot. She states she did drink and has been able to void.  Objective: Vital signs in last 24 hours: Temp:  [97.8 F (36.6 C)-99.32 F (37.4 C)] 98.6 F (37 C) (05/28 0729) Pulse Rate:  [64-75] 65 (05/28 0729) Cardiac Rhythm: Normal sinus rhythm (05/27 2116) Resp:  [12-24] 18 (05/28 0729) BP: (144-173)/(76-92) 172/77 (05/28 0729) SpO2:  [94 %-100 %] 96 % (05/28 0729) Weight:  [90.1 kg] 90.1 kg (05/28 0400)  Pre op weight 86.5 kg Current Weight  12/15/20 90.1 kg       Intake/Output from previous day: 05/27 0701 - 05/28 0700 In: 360.7 [I.V.:161; IV Piggyback:199.7] Out: 1030 [Urine:800; Chest Tube:230]   Physical Exam:  Cardiovascular: RRR, Pulmonary: Slightly diminished left basilar breath sounds Abdomen: Soft, non tender, bowel sounds present. Extremities: Mild bilateral lower extremity edema. Wounds: Aquacel intact. Right lower leg wounds are clean and dry.  No erythema or signs of infection.  Lab Results: CBC: Recent Labs    12/14/20 1527 12/15/20 0031  WBC 15.5* 17.1*  HGB 10.6* 11.0*  HCT 31.6* 33.3*  PLT 165 188   BMET:  Recent Labs    12/14/20 1527 12/15/20 0031  NA 133* 131*  K 3.6 4.2  CL 101 99  CO2 24 23  GLUCOSE 129* 132*  BUN 6* 9  CREATININE 0.69 0.63  CALCIUM 8.5* 8.4*    PT/INR:  Lab Results  Component Value Date   INR 1.3 (H) 12/13/2020   INR 1.0 12/12/2020   ABG:  INR: Will add last result for INR, ABG once components are confirmed Will add last 4 CBG results once components are confirmed  Assessment/Plan:  1. CV - SR, hypertensive. On Lopressor 25  mg bid. Will restart Lisinopril for better BP control. 2.  Pulmonary - On room air. Chest tube with 230 cc last 24 hours. CXR this am shows no pneumothorax, small left pleural effusion/atelectasis. Will remove chest tubes. Encourage incentive spirometer. 3. Volume Overload - On Lasix 40 mg daily 4.  Expected post op acute blood loss anemia - H and H this am stable at 11 and 33.3 5. DM-CBGs 134/122/133. Pre op HGA1C 6.3. Will restart Metformin 500 mg at bedtime. 6. Leukocytosis-WBC increased from 15,500 to 17,100. Afebrile. No wound infection. Likely related to SIRS. Re check in am 7. Patient has voided since this am (nurse's note stated she had not voided since transfer and bladder scan showed no urine).   Erica M ZimmermanPA-C 12/15/2020,9:27 AM Patient seen and examined, agree with above Dc chest tubes Increase ambulation  Viviann Spare C. Dorris Fetch, MD [facility] [phone]

## 2020-12-15 NOTE — Progress Notes (Signed)
CARDIAC REHAB PHASE I   PRE:  Rate/Rhythm: 74 SR  BP:  Supine:   Sitting: 179/100  Standing:    SaO2: 92-93%RA  MODE:  Ambulation: 100 ft   POST:  Rate/Rhythm: 87 SR  BP:  Supine:   Sitting: 180/100  Standing:    SaO2: 89-94%RA 0850-1000 Pt just finished with bath and resting. Did her education first so she could rest before walk. Discussed sternal precautions and staying in the tube. Encouraged IS. Discussed low carb and heart healthy food choices, walking instructions for ex, and CRP 2.  Referred to [place] CRP 2.  Pt's BP elevated before and after walk. Notified PA when she was in to assess pt. Pt walked 100 ft on RA with gait belt use, rolling walker, and asst x 1. C/o legs feeling weak. To recliner after walk with call bell in reach. Pt stated she has access to walker at home if needed. Pt needed help getting to standing. Encouraged rocking .   Luetta Nutting, RN BSN  12/15/2020 9:54 AM

## 2020-12-16 ENCOUNTER — Inpatient Hospital Stay (HOSPITAL_COMMUNITY): Payer: Medicare HMO

## 2020-12-16 LAB — BPAM RBC
Blood Product Expiration Date: 202206152359
Blood Product Expiration Date: 202206192359
Blood Product Expiration Date: 202206202359
Blood Product Expiration Date: 202206202359
ISSUE DATE / TIME: 202205241104
ISSUE DATE / TIME: 202205261258
ISSUE DATE / TIME: 202205261258
Unit Type and Rh: 6200
Unit Type and Rh: 6200
Unit Type and Rh: 6200
Unit Type and Rh: 6200

## 2020-12-16 LAB — BASIC METABOLIC PANEL
Anion gap: 8 (ref 5–15)
BUN: 8 mg/dL (ref 8–23)
CO2: 23 mmol/L (ref 22–32)
Calcium: 8.4 mg/dL — ABNORMAL LOW (ref 8.9–10.3)
Chloride: 97 mmol/L — ABNORMAL LOW (ref 98–111)
Creatinine, Ser: 0.52 mg/dL (ref 0.44–1.00)
GFR, Estimated: >60 mL/min (ref >60)
Glucose, Bld: 98 mg/dL (ref 70–99)
Potassium: 3.8 mmol/L (ref 3.5–5.1)
Sodium: 128 mmol/L — ABNORMAL LOW (ref 135–145)

## 2020-12-16 LAB — TYPE AND SCREEN
ABO/RH(D): A POS
Antibody Screen: NEGATIVE
Unit division: 0
Unit division: 0
Unit division: 0
Unit division: 0

## 2020-12-16 LAB — CBC
HCT: 34.2 % — ABNORMAL LOW (ref 36.0–46.0)
Hemoglobin: 11.4 g/dL — ABNORMAL LOW (ref 12.0–15.0)
MCH: 30 pg (ref 26.0–34.0)
MCHC: 33.3 g/dL (ref 30.0–36.0)
MCV: 90 fL (ref 80.0–100.0)
Platelets: 199 10*3/uL (ref 150–400)
RBC: 3.8 MIL/uL — ABNORMAL LOW (ref 3.87–5.11)
RDW: 13.5 % (ref 11.5–15.5)
WBC: 14.3 10*3/uL — ABNORMAL HIGH (ref 4.0–10.5)
nRBC: 0 % (ref 0.0–0.2)

## 2020-12-16 LAB — GLUCOSE, CAPILLARY
Glucose-Capillary: 102 mg/dL — ABNORMAL HIGH (ref 70–99)
Glucose-Capillary: 137 mg/dL — ABNORMAL HIGH (ref 70–99)
Glucose-Capillary: 132 mg/dL — ABNORMAL HIGH (ref 70–99)
Glucose-Capillary: 114 mg/dL — ABNORMAL HIGH (ref 70–99)

## 2020-12-16 MED ORDER — LACTULOSE 10 GM/15ML PO SOLN
20.0000 g | Freq: Once | ORAL | Status: AC
  Administered 2020-12-16: 20 g via ORAL
  Filled 2020-12-16: qty 30

## 2020-12-16 MED ORDER — POTASSIUM CHLORIDE CRYS ER 20 MEQ PO TBCR
20.0000 meq | EXTENDED_RELEASE_TABLET | Freq: Every day | ORAL | Status: DC
  Administered 2020-12-17 – 2020-12-18 (×2): 20 meq via ORAL
  Filled 2020-12-16 (×3): qty 1

## 2020-12-16 MED ORDER — CLONIDINE HCL 0.1 MG PO TABS
0.1000 mg | ORAL_TABLET | Freq: Two times a day (BID) | ORAL | Status: DC
  Administered 2020-12-16 – 2020-12-18 (×5): 0.1 mg via ORAL
  Filled 2020-12-16 (×5): qty 1

## 2020-12-16 MED ORDER — POTASSIUM CHLORIDE CRYS ER 20 MEQ PO TBCR
20.0000 meq | EXTENDED_RELEASE_TABLET | Freq: Two times a day (BID) | ORAL | Status: AC
  Administered 2020-12-16 (×2): 20 meq via ORAL
  Filled 2020-12-16 (×2): qty 1

## 2020-12-16 NOTE — Progress Notes (Signed)
Mobility Specialist - Progress Note   12/16/20 1525  Mobility  Activity Ambulated in hall  Level of Assistance Minimal assist, patient does 75% or more  Assistive Device Front wheel walker  Distance Ambulated (ft) 400 ft  Mobility Ambulated with assistance in hallway  Mobility Response Tolerated well  Mobility performed by Mobility specialist  $Mobility charge 1 Mobility   Pre-mobility: 86 HR During mobility: 118 HR Post-mobility: 90 HR  Pt min assist to stand from bedside. Asx throughout ambulation. Pt back in bed after walk, husband in room.   Mamie Levers Mobility Specialist Mobility Specialist Phone: [phone]

## 2020-12-16 NOTE — Progress Notes (Addendum)
      [address]       [place] [address]             [phone]        3 Days Post-Op Procedure(s) (LRB): CORONARY ARTERY BYPASS GRAFTING (CABG) TIMES FOUR USING RIGHT GREATER SAPHENOUS VEIN HARVESTED ENDOSCOPICALLY AND LEFT INTERNAL MAMMARY ARTERY. (N/A) TRANSESOPHAGEAL ECHOCARDIOGRAM (TEE) (N/A)  Subjective: Patient has not had a bowel movement yet.  Objective: Vital signs in last 24 hours: Temp:  [98.2 F (36.8 C)-98.8 F (37.1 C)] 98.2 F (36.8 C) (05/29 0409) Pulse Rate:  [70-89] 86 (05/29 0409) Cardiac Rhythm: Normal sinus rhythm (05/28 1900) Resp:  [15-20] 20 (05/29 0409) BP: (147-192)/(86-110) 181/90 (05/29 0409) SpO2:  [90 %-95 %] 95 % (05/29 0409)  Pre op weight 86.5 kg Current Weight  12/15/20 90.1 kg       Intake/Output from previous day: 05/28 0701 - 05/29 0700 In: 360 [P.O.:360] Out: -    Physical Exam:  Cardiovascular: RRR, Pulmonary: Slightly diminished left basilar breath sounds Abdomen: Soft, non tender, bowel sounds present. Extremities: Mild bilateral lower extremity edema. Wounds: Sternal wound is clean and dry. Right lower leg wounds are clean and dry.  No erythema or signs of infection.  Lab Results: CBC: Recent Labs    12/15/20 0031 12/16/20 0226  WBC 17.1* 14.3*  HGB 11.0* 11.4*  HCT 33.3* 34.2*  PLT 188 199   BMET:  Recent Labs    12/15/20 0031 12/16/20 0226  NA 131* 128*  K 4.2 3.8  CL 99 97*  CO2 23 23  GLUCOSE 132* 98  BUN 9 8  CREATININE 0.63 0.52  CALCIUM 8.4* 8.4*    PT/INR:  Lab Results  Component Value Date   INR 1.3 (H) 12/13/2020   INR 1.0 12/12/2020   ABG:  INR: Will add last result for INR, ABG once components are confirmed Will add last 4 CBG results once components are confirmed  Assessment/Plan:  1. CV - SR, still hypertensive. On Lopressor 25 mg bid,  Lisinopril 40 mg daily, and Hydralazine 25 bid. Will restart Clonidine 0.1 mg bid and stop Hydralazine for now. 2.   Pulmonary - On room air. CXR this am appears stable (left pleural effusion/atelectasis). Encourage incentive spirometer. 3. Volume Overload - On Lasix 40 mg daily 4.  Expected post op acute blood loss anemia - H and H this am stable at 11.4 and 34.2 5. DM-CBGs 172/117/102. Pre op HGA1C 6.3. On Metformin 500 mg at bedtime. 6. Leukocytosis-WBC decreased from 17,100 to 14,300. Afebrile. No wound infection. Likely related to SIRS.  7. Nurse to change chest tube dressing (dried blood) 8. Will consider discharge once BP better controlled  Donielle M ZimmermanPA-C 12/16/2020,7:56 AM  Patient seen and examined, agree with findings, A&P as above  Viviann Spare C. Dorris Fetch, MD [facility] [phone]

## 2020-12-17 LAB — GLUCOSE, CAPILLARY
Glucose-Capillary: 103 mg/dL — ABNORMAL HIGH (ref 70–99)
Glucose-Capillary: 169 mg/dL — ABNORMAL HIGH (ref 70–99)
Glucose-Capillary: 92 mg/dL (ref 70–99)
Glucose-Capillary: 99 mg/dL (ref 70–99)

## 2020-12-17 MED ORDER — METOPROLOL TARTRATE 25 MG PO TABS
37.5000 mg | ORAL_TABLET | Freq: Two times a day (BID) | ORAL | Status: DC
  Administered 2020-12-17: 37.5 mg via ORAL
  Filled 2020-12-17: qty 1

## 2020-12-17 MED ORDER — ASPIRIN 325 MG PO TBEC: 325.0000 mg | DELAYED_RELEASE_TABLET | Freq: Every day | ORAL | 0 refills | Status: DC

## 2020-12-17 NOTE — Care Management Important Message (Signed)
Important Message  Patient Details  Name: Erica Brock MRN: [id number] Date of Birth: Jan 19, 1951   Medicare Important Message Given:  Yes     Renie Ora 12/17/2020, 9:35 AM

## 2020-12-17 NOTE — Progress Notes (Addendum)
      [address]       [place] [address]             [phone]        4 Days Post-Op Procedure(s) (LRB): CORONARY ARTERY BYPASS GRAFTING (CABG) TIMES FOUR USING RIGHT GREATER SAPHENOUS VEIN HARVESTED ENDOSCOPICALLY AND LEFT INTERNAL MAMMARY ARTERY. (N/A) TRANSESOPHAGEAL ECHOCARDIOGRAM (TEE) (N/A)  Subjective: Patient had large bowel movement yesterday.  Objective: Vital signs in last 24 hours: Temp:  [98.4 F (36.9 C)-99 F (37.2 C)] 98.6 F (37 C) (05/30 0757) Pulse Rate:  [66-92] 66 (05/30 0757) Cardiac Rhythm: Sinus tachycardia (05/29 1901) Resp:  [14-18] 17 (05/30 0757) BP: (144-157)/(1-96) 157/88 (05/30 0757) SpO2:  [93 %-98 %] 93 % (05/30 0757) Weight:  [87.7 kg] 87.7 kg (05/30 0504)  Pre op weight 86.5 kg Current Weight  12/17/20 87.7 kg       Intake/Output from previous day: 05/29 0701 - 05/30 0700 In: 113 [P.O.:110; I.V.:3] Out: -    Physical Exam:  Cardiovascular: Slightly tachycardic Pulmonary: Slightly diminished left basilar breath sounds Abdomen: Soft, non tender, bowel sounds present. Extremities: Tracebilateral lower extremity edema. Wounds: Sternal wound is clean and dry. Right lower leg wounds are clean and dry.  No erythema or signs of infection.  Lab Results: CBC: Recent Labs    12/15/20 0031 12/16/20 0226  WBC 17.1* 14.3*  HGB 11.0* 11.4*  HCT 33.3* 34.2*  PLT 188 199   BMET:  Recent Labs    12/15/20 0031 12/16/20 0226  NA 131* 128*  K 4.2 3.8  CL 99 97*  CO2 23 23  GLUCOSE 132* 98  BUN 9 8  CREATININE 0.63 0.52  CALCIUM 8.4* 8.4*    PT/INR:  Lab Results  Component Value Date   INR 1.3 (H) 12/13/2020   INR 1.0 12/12/2020   ABG:  INR: Will add last result for INR, ABG once components are confirmed Will add last 4 CBG results once components are confirmed  Assessment/Plan:  1. CV - Slightly tachy,BP improved but still hypertensive. On Lopressor 25 mg bid,  Lisinopril 40 mg daily, and  Clonidine 0.1 mg bid. Will increase Lopressor  2.  Pulmonary - On room air.  Encourage incentive spirometer. 3. Volume Overload - On Lasix 40 mg daily 4.  Expected post op acute blood loss anemia - Last H and H stable at 11.4 and 34.2 5. DM-CBGs 322/114/103. Pre op HGA1C 6.3. On Metformin 500 mg at bedtime. 6. Hope to discharge in am, once BP and HR better controlled  Donielle M ZimmermanPA-C 12/17/2020,8:48 AM   Patient seen and examined, agree with above BP control still an issue Having some 4-5 beat runs of PACs- increase Lopressor  Viviann Spare C. Dorris Fetch, MD [facility] [phone]

## 2020-12-17 NOTE — Progress Notes (Signed)
Mobility Specialist: Progress Note   12/17/20 1749  Mobility  Activity Ambulated in hall  Level of Assistance Modified independent, requires aide device or extra time  Assistive Device Front wheel walker  Distance Ambulated (ft) 340 ft  Mobility Ambulated independently in hallway  Mobility Response Tolerated well  Mobility performed by Mobility specialist  Bed Position Chair  $Mobility charge 1 Mobility   Pre-Mobility: 98 HR During Mobility: 131 HR, 97% SpO2 Post-Mobility: 107 HR, 154/91 BP, 97% SpO2  Pt stopped to take two brief standing breaks due to feeling SOB and fatigue. Pt's HR peaked at 141 bpm after returning to room but quickly returned to normal after sitting in the chair. Pt has call bell and phone in reach.   [facility] Erica Brock Mobility Specialist Mobility Specialist Phone: [phone]

## 2020-12-18 LAB — GLUCOSE, CAPILLARY: Glucose-Capillary: 99 mg/dL (ref 70–99)

## 2020-12-18 MED ORDER — ASPIRIN 81 MG PO CHEW
81.0000 mg | CHEWABLE_TABLET | Freq: Every day | ORAL | Status: DC
  Administered 2020-12-18: 81 mg
  Filled 2020-12-18: qty 1

## 2020-12-18 MED ORDER — FUROSEMIDE 40 MG PO TABS: 40.0000 mg | ORAL_TABLET | Freq: Every day | ORAL | 0 refills | Status: DC

## 2020-12-18 MED ORDER — CLOPIDOGREL BISULFATE 75 MG PO TABS
75.0000 mg | ORAL_TABLET | Freq: Every day | ORAL | Status: DC
  Administered 2020-12-18: 75 mg via ORAL
  Filled 2020-12-18: qty 1

## 2020-12-18 MED ORDER — LISINOPRIL 40 MG PO TABS: 40.0000 mg | ORAL_TABLET | Freq: Every day | ORAL | 1 refills | Status: DC

## 2020-12-18 MED ORDER — METOPROLOL TARTRATE 50 MG PO TABS: 50.0000 mg | ORAL_TABLET | Freq: Two times a day (BID) | ORAL | 1 refills | Status: DC

## 2020-12-18 MED ORDER — CLOPIDOGREL BISULFATE 75 MG PO TABS: 75.0000 mg | ORAL_TABLET | Freq: Every day | ORAL | 1 refills | Status: DC

## 2020-12-18 MED ORDER — ASPIRIN EC 81 MG PO TBEC: 81.0000 mg | DELAYED_RELEASE_TABLET | Freq: Every day | ORAL | Status: DC

## 2020-12-18 MED ORDER — METOPROLOL TARTRATE 50 MG PO TABS
50.0000 mg | ORAL_TABLET | Freq: Two times a day (BID) | ORAL | Status: DC
  Administered 2020-12-18: 50 mg via ORAL
  Filled 2020-12-18: qty 1

## 2020-12-18 MED ORDER — ASPIRIN EC 81 MG PO TBEC: 81.0000 mg | DELAYED_RELEASE_TABLET | Freq: Every day | ORAL | Status: AC

## 2020-12-18 MED ORDER — TRAMADOL HCL 50 MG PO TABS: 50.0000 mg | ORAL_TABLET | Freq: Four times a day (QID) | ORAL | 0 refills | Status: DC | PRN

## 2020-12-18 MED ORDER — POTASSIUM CHLORIDE CRYS ER 20 MEQ PO TBCR: 20.0000 meq | EXTENDED_RELEASE_TABLET | Freq: Every day | ORAL | 0 refills | Status: DC

## 2020-12-18 NOTE — Progress Notes (Signed)
CARDIAC REHAB PHASE I   PRE:  Rate/Rhythm: 90 SR  BP:  Supine: 131/75  Sitting:   Standing:    SaO2: 96%RA  MODE:  Ambulation: 430 ft   POST:  Rate/Rhythm: 128 ST    106 ST.  BP:  Supine:   Sitting: 159/97  Standing:    SaO2: 96%RA 0833-0900 Pt walked 430 ft on RA with rolling walker with steady gait. Tolerated well. HR and BP elevated with activity but HR to 106 with rest. Reinforced ed from Saturday, reviewing walking, sternal precautions,IS and wound care. She stated husband has read [facility] booklet.   Luetta Nutting, RN BSN  12/18/2020 8:54 AM

## 2020-12-18 NOTE — Progress Notes (Signed)
Patients HR remained 110-120's ST while ambulating in hall, back to 100 NSR at rest in bed. No complaints of SOB or dizziness. Pt assisted to seated position on side of bed to eat.

## 2020-12-18 NOTE — Progress Notes (Addendum)
      [address]       [place] [address]             [phone]        5 Days Post-Op Procedure(s) (LRB): CORONARY ARTERY BYPASS GRAFTING (CABG) TIMES FOUR USING RIGHT GREATER SAPHENOUS VEIN HARVESTED ENDOSCOPICALLY AND LEFT INTERNAL MAMMARY ARTERY. (N/A) TRANSESOPHAGEAL ECHOCARDIOGRAM (TEE) (N/A)  Subjective: Patient eating breakfast. She has no complaints this am.  Objective: Vital signs in last 24 hours: Temp:  [98 F (36.7 C)-98.7 F (37.1 C)] 98.7 F (37.1 C) (05/31 0313) Pulse Rate:  [66-95] 79 (05/31 0313) Cardiac Rhythm: Sinus tachycardia (05/30 1901) Resp:  [15-20] 18 (05/31 0313) BP: (119-157)/(74-98) 147/81 (05/31 0313) SpO2:  [93 %-99 %] 98 % (05/31 0313) Weight:  [86.9 kg] 86.9 kg (05/31 0525)  Pre op weight 86.5 kg Current Weight  12/18/20 86.9 kg       Intake/Output from previous day: 05/30 0701 - 05/31 0700 In: 220 [P.O.:220] Out: -    Physical Exam:  Cardiovascular: Slightly tachycardic Pulmonary: Slightly diminished left basilar breath sounds Abdomen: Soft, non tender, bowel sounds present. Extremities: Tracebilateral lower extremity edema. Wounds: Sternal wound is clean and dry. Right lower leg wounds are clean and dry.  No erythema or signs of infection.  Lab Results: CBC: Recent Labs    12/16/20 0226  WBC 14.3*  HGB 11.4*  HCT 34.2*  PLT 199   BMET:  Recent Labs    12/16/20 0226  NA 128*  K 3.8  CL 97*  CO2 23  GLUCOSE 98  BUN 8  CREATININE 0.52  CALCIUM 8.4*    PT/INR:  Lab Results  Component Value Date   INR 1.3 (H) 12/13/2020   INR 1.0 12/12/2020   ABG:  INR: Will add last result for INR, ABG once components are confirmed Will add last 4 CBG results once components are confirmed  Assessment/Plan:  1. CV - She had PACs, ? SVT, ST yesterday. Slightly tachy this am. BP better controlled but SBP still in 140's. On Lopressor 37.5 mg bid,  Lisinopril 40 mg daily, and Clonidine 0.1 mg bid.  Will increase Lopressor again for better HR control. 2.  Pulmonary - On room air.  Encourage incentive spirometer. 3. Volume Overload - On Lasix 40 mg daily 4.  Expected post op acute blood loss anemia - Last H and H stable at 11.4 and 34.2 5. DM-CBGs 92/99/99. Pre op HGA1C 6.3. On Metformin 500 mg at bedtime. 6. As discussed with Dr. Cliffton Asters, ok to discharge  Haldon Carley M ZimmermanPA-C 12/18/2020,7:01 AM

## 2020-12-18 NOTE — Progress Notes (Signed)
Discharge instructions (including medications) discussed with and copy provided to patient/caregiver 

## 2020-12-18 NOTE — Plan of Care (Signed)
  Problem: Education: Goal: Knowledge of General Education information will improve Description: Including pain rating scale, medication(s)/side effects and non-pharmacologic comfort measures Outcome: Adequate for Discharge   

## 2020-12-19 MED FILL — Heparin Sodium (Porcine) Inj 1000 Unit/ML: INTRAMUSCULAR | Qty: 10 | Status: AC

## 2020-12-19 MED FILL — Electrolyte-R (PH 7.4) Solution: INTRAVENOUS | Qty: 5000 | Status: AC

## 2020-12-19 MED FILL — Sodium Bicarbonate IV Soln 8.4%: INTRAVENOUS | Qty: 50 | Status: AC

## 2020-12-19 MED FILL — Mannitol IV Soln 20%: INTRAVENOUS | Qty: 500 | Status: AC

## 2020-12-19 MED FILL — Sodium Chloride IV Soln 0.9%: INTRAVENOUS | Qty: 2000 | Status: AC

## 2020-12-19 MED FILL — Albumin, Human Inj 5%: INTRAVENOUS | Qty: 250 | Status: AC

## 2020-12-26 LAB — ECHO INTRAOPERATIVE TEE
Height: 64 in
Weight: 3049.61 oz

## 2020-12-28 ENCOUNTER — Other Ambulatory Visit: Payer: Self-pay

## 2020-12-28 ENCOUNTER — Encounter: Payer: Self-pay | Admitting: Thoracic Surgery (Cardiothoracic Vascular Surgery)

## 2020-12-28 ENCOUNTER — Telehealth (INDEPENDENT_AMBULATORY_CARE_PROVIDER_SITE_OTHER): Payer: Self-pay | Admitting: Thoracic Surgery (Cardiothoracic Vascular Surgery)

## 2020-12-28 DIAGNOSIS — I25810 Atherosclerosis of coronary artery bypass graft(s) without angina pectoris: Secondary | ICD-10-CM

## 2020-12-28 MED ORDER — METOPROLOL TARTRATE 50 MG PO TABS: 25.0000 mg | ORAL_TABLET | Freq: Two times a day (BID) | ORAL | 1 refills | Status: DC

## 2020-12-28 NOTE — Progress Notes (Signed)
     [address]       [place] [address]             [phone]       Patient: Erica Brock: Office Consent for Telemedicine visit obtained.  Today's visit was completed via a real-time telehealth (see specific modality noted below). The patient/authorized person provided oral consent at the time of the visit to engage in a telemedicine encounter with the present Brock at [facility]. The patient/authorized person was informed of the potential benefits, limitations, and risks of telemedicine. The patient/authorized person expressed understanding that the laws that protect confidentiality also apply to telemedicine. The patient/authorized person acknowledged understanding that telemedicine does not provide emergency services and that he or she would need to call 911 or proceed to the nearest hospital for help if such a need arose.   Total time spent in the clinical discussion 10 minutes.  Telehealth Modality: Phone visit (audio only)  I had a telephone visit with [name].  Overall doing well and slowly improving.  She denies any pain.  She is ambulating daily.  Blood pressure on the lower side.  Poor appetite.     I have recommended that she decrease her metoprolol to 25mg  daily I will see her in 1 months with a cxr  

## 2021-01-01 ENCOUNTER — Ambulatory Visit: Payer: Medicare HMO | Admitting: Physician Assistant

## 2021-01-01 ENCOUNTER — Encounter: Payer: Self-pay | Admitting: Physician Assistant

## 2021-01-01 ENCOUNTER — Other Ambulatory Visit: Payer: Self-pay

## 2021-01-01 VITALS — BP 112/78 | HR 59 | Ht 64.0 in | Wt 185.4 lb

## 2021-01-01 DIAGNOSIS — I10 Essential (primary) hypertension: Secondary | ICD-10-CM | POA: Diagnosis not present

## 2021-01-01 DIAGNOSIS — I252 Old myocardial infarction: Secondary | ICD-10-CM

## 2021-01-01 DIAGNOSIS — E119 Type 2 diabetes mellitus without complications: Secondary | ICD-10-CM | POA: Diagnosis not present

## 2021-01-01 DIAGNOSIS — Z951 Presence of aortocoronary bypass graft: Secondary | ICD-10-CM

## 2021-01-01 DIAGNOSIS — E785 Hyperlipidemia, unspecified: Secondary | ICD-10-CM

## 2021-01-01 DIAGNOSIS — I5020 Unspecified systolic (congestive) heart failure: Secondary | ICD-10-CM

## 2021-01-01 DIAGNOSIS — I080 Rheumatic disorders of both mitral and aortic valves: Secondary | ICD-10-CM

## 2021-01-01 DIAGNOSIS — I25118 Atherosclerotic heart disease of native coronary artery with other forms of angina pectoris: Secondary | ICD-10-CM

## 2021-01-01 DIAGNOSIS — K219 Gastro-esophageal reflux disease without esophagitis: Secondary | ICD-10-CM

## 2021-01-01 MED ORDER — ISOSORBIDE MONONITRATE ER 30 MG PO TB24: 30.0000 mg | ORAL_TABLET | Freq: Every day | ORAL | 3 refills | Status: DC

## 2021-01-01 MED ORDER — FUROSEMIDE 20 MG PO TABS: 20.0000 mg | ORAL_TABLET | Freq: Every day | ORAL | 3 refills | Status: AC | PRN

## 2021-01-01 MED ORDER — METOPROLOL TARTRATE 25 MG PO TABS: 25.0000 mg | ORAL_TABLET | Freq: Two times a day (BID) | ORAL | 3 refills | Status: DC

## 2021-01-01 MED ORDER — CLOPIDOGREL BISULFATE 75 MG PO TABS: 75.0000 mg | ORAL_TABLET | Freq: Every day | ORAL | 3 refills | Status: DC

## 2021-01-01 NOTE — Progress Notes (Signed)
Office Visit    Patient Name: Erica Brock Date of Encounter: 01/01/2021  PCP:  Leim Fabry, MD   [facility]  Cardiologist:  [name], MD  Advanced Practice Provider:  Lennon Alstrom, PA-C Electrophysiologist:  None   Chief Complaint    Chief Complaint  Patient presents with   Follow-up    Hospital follow up  doing well    70 y.o. female coronary artery disease s/p CABG x4 12/13/20 (LIMA to LAD,rev SVG to PDA, rev SVG to OM1, rev SVG to Diag1) due to 3v CAD by cath after NSTEMI with abnormal MPI, aortic atherosclerosis, mild MR,  hypertension, hyperlipidemia, obesity, OSA, DM2, history of rheumatic fever, GERD, history of DVT/pulmonary embolism in 2018 in the setting of surgery, rheumatoid arthritis, osteoarthritis, anxiety, history of cystic mastopathy, and who is being seen today for follow-up after NSTEMI with cath showing significant 3v CAD and transfer to [facility] for CABG with greater saphenous VG on the right by Dr. Cliffton Asters 12/13/20.  Past Medical History    Past Medical History:  Diagnosis Date   Anxiety    Arthritis    Diabetes mellitus without complication (HCC)    Diffuse cystic mastopathy 2012   GERD (gastroesophageal reflux disease)    Heart murmur    ETT myoview 12/09 (Dr. Juel Burrow) no evidence of ischemia, good exercise tolerance - 9 min. echo (11/10): EF 60-65%, nml RV, PASP 25, nml valves   Hepatitis    as a child    HLD (hyperlipidemia)    HTN (hypertension)    for < 10 years   Obesity    OSA (obstructive sleep apnea)    mild; uses CPAP   Rheumatic fever    as a child   Past Surgical History:  Procedure Laterality Date   arthroscopic surgery     Erica elbow    BREAST BIOPSY Left    needle bx-neg   carpael tunnel release     R hand   COLONOSCOPY  2008   Dr. Sharen Hint   CORONARY ARTERY BYPASS GRAFT N/A 12/13/2020   Procedure: CORONARY ARTERY BYPASS GRAFTING (CABG) TIMES FOUR USING RIGHT GREATER SAPHENOUS  VEIN HARVESTED ENDOSCOPICALLY AND LEFT INTERNAL MAMMARY ARTERY.;  Surgeon: Corliss Skains, MD;  Location: [facility] OR;  Service: Open Heart Surgery;  Laterality: N/A;   FOOT SURGERY Left    LEFT HEART CATH AND CORONARY ANGIOGRAPHY N/A 12/11/2020   Procedure: LEFT HEART CATH AND CORONARY ANGIOGRAPHY;  Surgeon: [name], MD;  Location: [facility] INVASIVE CV LAB;  Service: Cardiovascular;  Laterality: N/A;   reconstruction foot and bunion surgery - both feet     TEE WITHOUT CARDIOVERSION N/A 12/13/2020   Procedure: TRANSESOPHAGEAL ECHOCARDIOGRAM (TEE);  Surgeon: Corliss Skains, MD;  Location: [facility] OR;  Service: Open Heart Surgery;  Laterality: N/A;   TONSILLECTOMY     TOTAL ABDOMINAL HYSTERECTOMY  1994    Allergies  Allergies  Allergen Reactions   Prednisone Other (See Comments)    Elevated BP   Atorvastatin     Muscle aches     History of Present Illness    Erica Brock is a 70 y.o. female with PMH as above.   CAC Score 2407 in 2018 with diffuse LM and 3v CAC. 2018 MPI ruled low risk though moderate in size defect noted as below and thought to be 2/2 artifact and less likely ischemia.     Echo 09/19/20 showed nl EF and mild MR.  She is followed by [facility] cardiology for HTN and HLD with last office visit 10/05/2020.   She has no history of tobacco use.  She reports occasional alcohol use with 1-2 drinks each occasion.  Family history includes a sister with myocardial infarction and CABG.  He has personal history of a pulmonary embolism in 2018 in the setting of surgery and is s/p OAC.     When seen in office 10/05/20, BP was elevated at 152/90; however home BP routinely SBP 1 10-1 20 and DBP 60 to 80s.  Home BP cuff checked against that of clinic and found to be 10 points higher on systolic and diastolic readings.  No medication changes were made to her clonidine 0.1 mg twice daily and lisinopril 40 mg daily.   On 12/10/2020, she presented to [facility] ED after experiencing nausea,  emesis, and diarrhea that occurred in close proximity to taking a multivitamin. Diarrhea described as liquid. After her nausea, emesis, and diarrhea, she felt weak with abdominal discomfort. She had a similar episode that occurred shortly after taking her multivitamin before in the past.  No CP/SOB/DOE leading up to her ED visit. She was walking for physical activity. She usually did not walk far but denied any exertional sx / limiting CP/DOE. No CP/SOB with hills or stairs. No LEE other than that chronically associated with an ankle injury and surgery.  She has intermittent abdominal bloating without clear triggers and at times making it difficult for her to fasten her bra.  No orthopnea or other reported sx of volume overload. Her BP has been well controlled on her current medications.   In the emergency department, she was tachycardic. Labs showed AKI with creatinine 1.04, albumin elevated 5.4, leukocytosis with WBC 17.8, platelets elevated at 403, high-sensitivity troponin peaked at 329.  EKG NSR with LAFB and incomplete RBBB at 84 bpm and no significant ST/T changes. Given RF for CAD, recommendation was for MPI that was ruled high risk with mid anteroseptal and apical septal defect suggestive of scar and reversible basal and mid anterolateral / inferolateral defect consistent with ischemia. CP reported after regadenoson. Subsequent LHC 5/24 showed heavily calcified coronary arteries with significant 3v disease as outlined below under CV studies. Recommendation was for transfer to [facility] for evaluation by CTS.   She was seen by CTS with CABG x4 12/13/20 by Dr. Cliffton Asters using R SVG harvest (LIMA to LAD,rev SVG to PDA, rev SVG to OM1, rev SVG to Diag1) ==. She was then started on lopressor and diuresed. It was noted she likely has prediabetes with information provided at discharge. She had expected post op blood loss anemia that did not require transfusion. She was hypertensive post op. Lisinopril   daily was restarted and hydralazine added 12/15/20. Clonidine was restarted 0.1mg  BID on 5/29 and hydralazine then stopped. She had runs of SVT and PACs and was more tachycardic on 12/17/20. Lopressor increased to 37.5 mg twice daily and then again to 50 mg twice daily given ongoing tachycardia.  Case was discussed with Dr. Cliffton Asters and aspirin decreased to 81 mg daily with Plavix started, given presentation with non-STEMI.     Discharge medications included ASA 81 mg daily, Plavix 75 mg daily, Lopressor 50 mg twice daily, KCl tab 20 M EQ, clonidine 0.1 mg twice daily, Lasix 40 mg daily, Zetia 10 mg daily, lisinopril 40 mg daily, pantoprazole 40 mg daily, Crestor 40 mg daily.  Only 4 days of potassium and diuresis were recommended/provided at discharge. At  discharge, BP 131/75 with HR 97 bpm.  Weight 86.9 kg.  Trace bilateral lower extremity edema was noted on exam.   She has had a telemedicine visit 12/28/2020 with Dr. Cliffton Asters and reported ambulating 20 minutes daily.  BP was on the lower side.  She reported poor appetite.  Metoprolol was decreased to 25 mg daily.  It was recommended she follow-up in 1 month with chest x-ray.  Today, 01/01/2021, she returns to clinic and notes that she is overall doing well from cardiac standpoint since her bypass surgery.  She reports that her upper extremity incisions are healing well, including her sternal incision.  Her right graft incisions are tender to touch but also healing well and today without any erythema, warmth, or evidence of infection.  She reports her energy is low and wonders if this is typical after bypass surgery.  Her energy is low but not worsening with each day and stable to somewhat improved since discharge, which we discussed as reassuring. She is ambulating for 23m daily and has researched online the appropriate exercises to do after a cardiac event. We did discuss cardiac rehab with patient report that this program is not well covered by her  insurance and inquiry regarding completing just the PT portion of rehab.  This was discussed with my nurse today and we will update the patient regarding if only PT of cardiac rehab will be completed moving forward.  She does state that she has been following the recommendations regarding heart healthy diet and heart failure as provided during her admission.  She denies any recurrent chest pain as experienced during her MPI.  No further nausea or emesis, as reported at the time of her admission to [facility]. Most recent admission reviewed with pt request to follow with Dr. Okey Dupre as primary cardiologist moving forward and given he performed her catheterization, which will be adjusted in her EMR. She reports that her breathing has been stable since discontinuation of the spirometry, which she did complete as recommended at discharge.  She denies any orthopnea or PND, though as below, bilateral reduced breath sounds appreciated today with agreement that she should have a CXR within 1 month as recommended by Dr. Cliffton Asters.  She does report some abdominal distention with recommendation to restart Lasix as below.  She has been taking home weights with yesterday's weight 193 pounds.  Home BP has been running 116/70.  As above, her metoprolol was recently decreased to 25 mg daily after reporting BP low with systolic in the 80s at the time of her telemedicine visit 12/28/2020.  Since this decrease, no racing heart rate or palpitations.  No presyncope or syncope.  No recent mechanical falls.  She reports CPAP compliance.  She denies any signs or symptoms of bleeding.  As outlined below, EKG reviewed same day with DOD given TWI in V1-V2 without additional recommendations to those outlined below.  Home Medications   Current Outpatient Medications  Medication Instructions   aspirin EC 81 mg, Oral, Daily   cloNIDine (CATAPRES) 0.1 mg, Oral, 2 times daily   clopidogrel (PLAVIX) 75 mg, Oral, Daily   ezetimibe (ZETIA) 10 MG tablet  TAKE 1 TABLET DAILY   furosemide (LASIX) 20 mg, Oral, Daily PRN   hydroxychloroquine (PLAQUENIL) 200 mg, Oral, 2 times daily   isosorbide mononitrate (IMDUR) 30 mg, Oral, Daily   lisinopril (ZESTRIL) 40 mg, Oral, Daily   metFORMIN (GLUCOPHAGE-XR) 500 mg, Oral, Daily at bedtime   metoprolol tartrate (LOPRESSOR) 25 mg, Oral, 2 times  daily   montelukast (SINGULAIR) 10 mg, Oral, Daily PRN   pantoprazole (PROTONIX) 40 mg, Daily   potassium chloride SA (KLOR-CON) 20 MEQ tablet 20 mEq, Oral, Daily, For 4 days then stop.   rosuvastatin (CRESTOR) 40 mg, Oral, Daily   traMADol (ULTRAM) 50 mg, Oral, Every 6 hours PRN     Review of Systems    She reports fatigue and feeling puffy or abdominal distention at times.  She had previously low BP with systolic in the 80s, improving with reduced dose metoprolol 25 mg twice daily. She denies chest pain, palpitations, dyspnea, pnd, orthopnea, n, v, dizziness, syncope, edema, weight gain, or early satiety.  She reports tenderness of her RLE graft site but that it is healing well without evidence of infection. Sternal incision healing well. All other systems reviewed and are otherwise negative except as noted above.  Physical Exam    VS:  BP 112/78   Pulse (!) 59   Ht  (1.626 m)   Wt 185 lb 6.4 oz (84.1 kg)   SpO2 98%   BMI 31.82 kg/m  , BMI Body mass index is 31.82 kg/m. GEN: Well nourished, well developed, in no acute distress. Joined by her husband. HEENT: normal. Neck: Supple, no JVD, carotid bruits, or masses. Cardiac: bradycardic but regular, 1/6 systolic murmur. No rubs, or gallops. No clubbing, cyanosis, edema.  Radials/DP/PT 2+ and equal bilaterally. Sternal incision healing well. RLE graft site without erythema or warmth and scabbed over without any evidence of discharge or infection. Still tender.  Respiratory:  Respirations regular and unlabored, breath sounds reduced bilaterally. GI: Soft, nontender, nondistended, BS + x 4. MS: no  deformity or atrophy. Skin: warm and dry, no rash. Neuro:  Strength and sensation are intact. Psych: Normal affect.  Accessory Clinical Findings    ECG personally reviewed by me today - SB, 59 bpm, LAD, IVCD, poor R wave inferior leads, TWI  in V1-2 and more acute than previous tracings, T wave inversion in lateral leads as seen in prior tracings/not new, poor R wave precordial leads, reviewed with DOD same day- no acute changes.  VITALS Reviewed today   Temp Readings from Last 3 Encounters:  12/18/20 98.5 F (36.9 C) (Oral)  12/11/20 98.6 F (37 C) (Oral)  11/22/17 98.5 F (36.9 C) (Oral)   BP Readings from Last 3 Encounters:  01/01/21 112/78  12/18/20 131/75  12/11/20 (!) 169/87   Pulse Readings from Last 3 Encounters:  01/01/21 (!) 59  12/18/20 97  12/11/20 78    Wt Readings from Last 3 Encounters:  01/01/21 185 lb 6.4 oz (84.1 kg)  12/18/20 191 lb 9.3 oz (86.9 kg)  12/11/20 193 lb 2 oz (87.6 kg)     LABS  reviewed today   Lab Results  Component Value Date   WBC 14.3 (H) 12/16/2020   HGB 11.4 (Erica) 12/16/2020   HCT 34.2 (Erica) 12/16/2020   MCV 90.0 12/16/2020   PLT 199 12/16/2020   Lab Results  Component Value Date   CREATININE 0.52 12/16/2020   BUN 8 12/16/2020   NA 128 (Erica) 12/16/2020   K 3.8 12/16/2020   CL 97 (Erica) 12/16/2020   CO2 23 12/16/2020   Lab Results  Component Value Date   ALT 21 12/13/2020   AST 26 12/13/2020   ALKPHOS 41 12/13/2020   BILITOT 0.7 12/13/2020   Lab Results  Component Value Date   CHOL 118 12/12/2020   HDL 61 12/12/2020   LDLCALC 31  12/12/2020   TRIG 130 12/12/2020   CHOLHDL 1.9 12/12/2020    Lab Results  Component Value Date   HGBA1C 6.3 (H) 12/10/2020   Lab Results  Component Value Date   TSH 0.962 09/04/2020     STUDIES/PROCEDURES reviewed today   CABG 12/13/20 CABG X 4, LIMA to LAD, reverse saphenous vein graft to PDA, reverse saphenous vein to OM1, reverse saphenous vein graft to first diagonal Endoscopic  greater saphenous vein harvest on the rightGood LIMA, good vein conduit.  Heavily calcified LAD.  Partially intramyocardial obtuse marginal.  Small PDA.  Good-quality diagonal.  Intraoperative TEE 12/13/20 POST-OP IMPRESSIONS  - Left Ventricle: has normal systolic function, with an ejection fraction  of  55%.  - Right Ventricle: The right ventricle appears unchanged from pre-bypass.  - Aorta: The aorta appears unchanged from pre-bypass.  - Left Atrium: The left atrium appears unchanged from pre-bypass.  - Left Atrial Appendage: The left atrial appendage appears unchanged from  pre-bypass.  - Aortic Valve: The aortic valve appears unchanged from pre-bypass.  - Mitral Valve: The mitral valve appears unchanged from pre-bypass.  - Tricuspid Valve: The tricuspid valve appears unchanged from pre-bypass.  - Pulmonic Valve: The pulmonic valve appears unchanged from pre-bypass.  - Interatrial Septum: The interatrial septum appears unchanged from  pre-bypass.  - Interventricular Septum: The interventricular septum appears unchanged  from  pre-bypass.  - Pericardium: The pericardium appears unchanged from pre-bypass.  PRE-OP FINDINGS   Left Ventricle: The left ventricle has mildly reduced systolic function,  with an ejection fraction of 45-50%. The cavity size was normal. There is  mild concentric left ventricular hypertrophy.   Vas US Doppler pre CABG/Carotid and arms 12/12/20 Summary:  Right Carotid: Velocities in the right ICA are consistent with a 1-39%  stenosis.  Left Carotid: Velocities in the left ICA are consistent with a 1-39%  stenosis.  Vertebrals: Bilateral vertebral arteries demonstrate antegrade flow.  Right Upper Extremity: Doppler waveforms remain within normal limits with  right radial compression. Doppler waveforms decrease 50% with right ulnar  compression.  Left Upper Extremity: Doppler waveform obliterate with left radial  compression. Doppler waveforms remain within  normal limits with left ulnar  compression.      LHC  12/11/2020 Severe three-vessel coronary artery disease with heavy calcification, including 40% mid LMCA stenosis, sequential 40% mid and 70-80% mid/distal LAD lesions, 50% proximal LCx disease at takeoff of large OM branch followed by sequential 70% and 99% stenoses in OM1, and chronic total occlusion of dominant RCA with left to right and right to right collaterals. Mildly elevated left ventricular filling pressure. Given three-vessel coronary artery disease including subtotal/total occlusions of OM1 and RCA, heavy calcification throughout the coronary arteries, and history of diabetes mellitus, transfer to [facility] for surgical consultation for CABG is recommended. Start IV heparin 2 hours after TR band removal. Obtain echocardiogram. Aggressive secondary prevention.  Diagnostic Dominance: Right    MPI 12/11/20 Abnormal pharmacologic myocardial perfusion stress test. There is a moderate in size, severe, reversible defect involving the basal and mid anterolateral and inferolateral segments consistent with ischemia. There is a small in size, severe, fixed mid anteroseptal and apical septal defect consistent with scar. The left ventricular ejection fraction is mildly decreased (48%). Attenuation correction CT demonstrates coronary artery calcification and aortic atherosclerosis. This is a high risk study.   Echo 09/19/20  1. Left ventricular ejection fraction, by estimation, is 55 to 60%. Left  ventricular ejection fraction by 3D volume is  59 %. The left ventricle has  normal function. The left ventricle has no regional wall motion  abnormalities. There is mild left  ventricular hypertrophy. Left ventricular diastolic parameters are  indeterminate. The average left ventricular global longitudinal strain is  -13.6 %. The global longitudinal strain is abnormal.   2. Right ventricular systolic function is normal. The right ventricular   size is normal.   3. The mitral valve is grossly normal. Mild mitral valve regurgitation.   4. The aortic valve is tricuspid. Aortic valve regurgitation is trivial.  Mild aortic valve sclerosis is present, with no evidence of aortic valve  stenosis.   5. The inferior vena cava is normal in size with <50% respiratory  variability, suggesting right atrial pressure of 8 mmHg.   MPI 2018 Low risk study. There is a moderate in size, mild in severity, partially reversible defect involving the apical inferior, apical lateral, and apical segments. Given normal wall motion on gated images, this most likely represents artifact (apical thinning and attenuation) and less likely ischemia. Fair exercise capacity without significant ST-segment or T-wave changes. Normal left ventricular contraction (LVEF 65%).   CAC score 2018 FINDINGS: Non-cardiac: See separate report from [facility]. Ascending Aorta:  3.6 cm Pericardium: Normal Coronary arteries: Marked diffuse LM and three vessel coronary artery calcification IMPRESSION: Coronary calcium score of 2407 . This was 28 th percentile for age and sex matched control.   Would recommend f/u perfusion study given how high calcium score is   Assessment & Plan    History of NSTEMI (12/10/20) 3v CAD s/p CABG x4 (12/13/20) -- Admitted to [facility] with HS Tn peaking at 329.  LHC 5/24 after abnormal stress test results and CP s/p regadenoson infusion.  LHC showed multivessel CAD as detailed under CV studies above and including CTO of dominant RCA with Erica-R and R-Erica collateralization.  She was transferred to [facility] with CABG x4 (LIMA to LAD,rev SVG to PDA, rev SVG to OM1, rev SVG to Diag1).  TEE obtained during CABG as above.  She has not yet had a transthoracic echocardiogram.  Echo from 09/2020 with EF 55 to 60%, mild MR, trivial/mild AR. TTE scheduled to reassess EF, wall motion, valvular function, and heart pressures s/p intervention. TEE during  CABG with reduced EF 45-50%. Continue ASA 81 mg daily, Plavix 75 mg daily, Lopressor (reduced at most recent CTS visit to Lopressor 25 mg daily), Crestor 40 mg daily, Zetia 10 mg daily. Obtain CBC and BMET for medical management - ensure stable Cr/K/H&H.  Annual LDL/LFTs on statin as below. Start Imdur  daily as BP allows. As below, tentative plan to reduce dose of lisinopril pending labs and given soft BP and hope to restart diuresis. If ongoing soft BP after these changes, will stop clonidine going forward.  Cardiac rehab recommended and will call pt regarding if can isolate the PT portion of rehab or reduce the number of times per week required to obtain rehab. If fatigue worsens, she will let us know. Future recommendations include consolidation of Lopressor to Toprol-XL at RTC.  Will defer for now wall titrate to find the appropriate dose.  Acute on chronic HFpEF --Reports abdominal distention. Bilateral reduced breath sounds on exam. Suspect she is holding on to volume; however, given BP today soft, will plan to first start Imdur as above and with further changes as outlined below and pending repeat labs. CABG TEE with reduced EF 45-50% as above and concentric hypertrophy. 09/2020 echo EF 55-60%, mild  LVH, and RAP . TTE scheduled as above. Reassess EF with CABG TEE showing mildly reduced EF as above. Restart PRN diuresis. Only 4d of diuresis prescribed at discharge. Restart lasix  daily PRN (3lb overnight, 5lb in 1 week) and KCL tab (on days she takes her lasix) and to be adjusted as directly below based on labs / BMET with pt updated at that time.  If Cr allows, suspect will need at least three days lasix  daily if BP allows and with ACE dose likely decreased in this setting as below. Obtain BMET today (and likely repeat at RTC):  Lasix: She was only prescribed a 4 days of lasix with suspicion she may need daily diuresis going forward but BP soft today and Imdur started as  well, which will also decrease BP. If labs show stable Cr and she has tolerated Imdur: 3 days of lasix  daily followed by lasix 20 mg PRN thereafter for wt gain 3lb overnight or 5lb in 1 week. Reduce to lisinopril  going forward to allow room in BP.  If ongoing soft BP, discontinue clonidine, which we discussed today.  Potassium: Typically, only of Kcl tab recommended with lasix . She is currently on double this dose ( ) with her lasix . Pending BMET, If labs show elevated K: Hold Kcl tab x 3 days, even if planning for 3d of lasix as above. Restart at reduced Kcl tab on days she takes her lasix thereafter. Reduce lisinopril to  qd as above, given this elevates K. Repeat BMET in 2-4 weeks. Monitor home BP and wt.  Call the office if BP consistently under SBP 100, and we will discontinue clonidine. Salt under 2g/day, Fluids under 2L / day.  HLD, goal LDL <70 --12/12/2020 LDL 31 and at goal below 70.  Continue Crestor 40 mg and Zetia 10 mg daily with target LDL less than 70.    Bilateral mild carotid dz --See carotids under CV studies. Bilateral carotids with mild dz at 1-39%. Continue to monitor sx and update imaging as indicated. Continue ASA and statin. LDL goal under 70.  Hypertension, goal BP <130/80 --BP today soft, though improved from SBP 80s after recent decrease in her BB given soft BP at time of telemedicine visit with Dr. Cliffton Asters.  Starting Imdur  daily today if BP allows, given soft BP. Continue current dose lisinopril  qd for now and pending labs Pending labs, decreasing to lisinopril  daily, which will allow additional room in BP. Wean off of clonidine at future clinic visits as discussed today, given side effect profile of this medication including rebound hypertension between doses.   If BP remains soft between visits, can discontinue before RTC. Suspect addition of Imdur and restart of diuresis will allow [place] to  discontinue clonidine soon.  Once beta-blocker dose optimized, transition to Toprol XL.  Will defer for now.  Mild MR, Aortic sclerosis with trivial AR --Periodic echo to monitor. See CV studies above.  Anemia --Repeat CBC to confirm stable H&H on DAPT.  Nausea, emesis, diarrhea --Continues to deny recurrence since discharge.   Recheck BMET to ensure stable renal function and electrolytes. Continue Protonix. If ongoing sx, consider holding metformin / changing agents - defer to PCP.  DM2 --Hemoglobin A1c noted to be elevated at 6.3 during admission.   Continue metformin per PCP. Ongoing glycemic control per PCP. Agree with lisinopril given comorbid DM2.    ---------------------------------------------------------------------------   Medication changes:  --Start Imdur  daily.  --  Pending labs/BP, possible 3d increased dose diuresis with lasix 20mg  daily then PRN sliding scale as above.  --Pending labs, adjustment of KCl tab 20 M EQ as indicated by potassium levels (suspect KCl tab 10 M EQ's the appropriate dose if taking Lasix 20 mg). --Pending labs and BP, possible decrease to lisinopril 20 mg daily to allow for room in BP. --If BP remains low between visits, okay to discontinue clonidine to allow for room in BP with the above medication changes.  At RTC, will discuss weaning off of clonidine given its side effect profile. Labs ordered:  --CBC (confirm stable H&H given anemia and DAPT)  --BMET (possible 3 days increased diuresis, possible K+ and ACE dose adjustment) Studies / Imaging ordered:  --Echo  Future considerations:  --As requested, will follow with Dr. going forward as he performed her catheterization. --See medication changes as above. Disposition:  --RTC 1 month.   *Please be aware that the above documentation was completed voice recognition software and may contain dictation errors.      Okey Dupre, PA-C 01/01/2021

## 2021-01-01 NOTE — Patient Instructions (Signed)
Medication Instructions:  Your physician has recommended you make the following change in your medication:   START Imdur (Isosorbide) 30mg  DAILY  RESTART Lasix (Furosemide) 20mg  AS NEEDED    - For weight gain of 3 lbs overnight or 5 lbs in one week  Refills were sent for Metoprolol and Plavix today  *If you need a refill on your cardiac medications before your next appointment, please call your pharmacy*   Lab Work:  Your physician recommends that you have lab work TODAY: BMET, CBC, BNP  If you have labs (blood work) drawn today and your tests are completely normal, you will receive your results only by: MyChart Message (if you have MyChart) OR A paper copy in the mail If you have any lab test that is abnormal or we need to change your treatment, we will call you to review the results.   Testing/Procedures:  Your physician has requested that you have an echocardiogram. Echocardiography is a painless test that uses sound waves to create images of your heart. It provides your doctor with information about the size and shape of your heart and how well your heart's chambers and valves are working. This procedure takes approximately one hour. There are no restrictions for this procedure.   Follow-Up: At [facility], you and your health needs are our priority.  As part of our continuing mission to provide you with exceptional heart care, we have created designated Provider Care Teams.  These Care Teams include your primary Cardiologist (physician) and Advanced Practice Providers (APPs -  Physician Assistants and Nurse Practitioners) who all work together to provide you with the care you need, when you need it.  We recommend signing up for the patient portal called "MyChart".  Sign up information is provided on this After Visit Summary.  MyChart is used to connect with patients for Virtual Visits (Telemedicine).  Patients are able to view lab/test results, encounter notes, upcoming  appointments, etc.  Non-urgent messages can be sent to your provider as well.   To learn more about what you can do with MyChart, go to .    Your next appointment:   1 month(s)  The format for your next appointment:   In Person  Provider:   You may see [facility], MD or one of the following Advanced Practice Providers on your designated Care Team:    [email or web address], Julien Nordmann

## 2021-01-02 LAB — CBC
Hematocrit: 35.7 % (ref 34.0–46.6)
Hemoglobin: 11.7 g/dL (ref 11.1–15.9)
MCH: 29.8 pg (ref 26.6–33.0)
MCHC: 32.8 g/dL (ref 31.5–35.7)
MCV: 91 fL (ref 79–97)
Platelets: 438 10*3/uL (ref 150–450)
RBC: 3.93 x10E6/uL (ref 3.77–5.28)
RDW: 13.1 % (ref 11.7–15.4)
WBC: 7.2 10*3/uL (ref 3.4–10.8)

## 2021-01-02 LAB — BASIC METABOLIC PANEL
BUN/Creatinine Ratio: 10 — ABNORMAL LOW (ref 12–28)
BUN: 9 mg/dL (ref 8–27)
CO2: 21 mmol/L (ref 20–29)
Calcium: 9.8 mg/dL (ref 8.7–10.3)
Chloride: 98 mmol/L (ref 96–106)
Creatinine, Ser: 0.86 mg/dL (ref 0.57–1.00)
Glucose: 104 mg/dL — ABNORMAL HIGH (ref 65–99)
Potassium: 5 mmol/L (ref 3.5–5.2)
Sodium: 136 mmol/L (ref 134–144)
eGFR: 72 mL/min/{1.73_m2} (ref >59)

## 2021-01-02 LAB — BRAIN NATRIURETIC PEPTIDE: BNP: 155 pg/mL — ABNORMAL HIGH (ref 0.0–100.0)

## 2021-01-03 ENCOUNTER — Telehealth: Payer: Self-pay | Admitting: Physician Assistant

## 2021-01-03 DIAGNOSIS — I10 Essential (primary) hypertension: Secondary | ICD-10-CM

## 2021-01-03 DIAGNOSIS — Z79899 Other long term (current) drug therapy: Secondary | ICD-10-CM

## 2021-01-03 MED ORDER — LISINOPRIL 10 MG PO TABS: 10.0000 mg | ORAL_TABLET | Freq: Every day | ORAL | 1 refills | Status: DC

## 2021-01-03 MED ORDER — POTASSIUM CHLORIDE CRYS ER 10 MEQ PO TBCR: 10.0000 meq | EXTENDED_RELEASE_TABLET | Freq: Every day | ORAL | 5 refills | Status: DC | PRN

## 2021-01-03 NOTE — Telephone Encounter (Signed)
Spoke to pt. BP last night 104/62 at 8PM.  This morning BP 89/58 and rechecked just now 77/51.   Pt seen in office 01/01/21 by Marisue Ivan, PA.  Imdur 30mg  added at ov.  Pt waited until this morning to take first dose of Imdur 30mg .  Pt denies dizziness, light headedness, shortness of breath, etc.  States "I feel fine."  Jacquelyn made aware. New orders received.  - Pt will STOP Imdur.  - Decr Lisinopril to 10mg  daily  - Pt will continue to monitor BP and contact office w/ any change in s/s   Pt also given lab results, see result note 6/14:  Per JV, pt instructed to wait until BP regulated for at least 2 days before starting Lasix 20mg  x 3 days.  Advised pt's SBP at least 105-110 prior to starting Lasix.  JV also changed Potassium to PRN only when takes Lasix.  Pt is aware to ONLY take Lasix for 3 days and will not take Potassium while takes Lasix x 3 days and will only take PRN with lasix moving forward.   Pt read back instructions given and verbalized understanding.  Advised pt if BP continues to stay low and develops new onset s/s dizziness, SOB, feels unstable to go to the ER.  Pt voiced understanding and has no further questions at this time.

## 2021-01-03 NOTE — Telephone Encounter (Signed)
Pt c/o BP issue: STAT if pt c/o blurred vision, one-sided weakness or slurred speech  1. What are your last 5 BP readings?   Last night 104/62  This morning 77/51 after meds  87/57 2. Are you having any other symptoms (ex. Dizziness, headache, blurred vision, passed out)? Slight dizziness this morning   3. What is your BP issue?  Patient had recent procedure and is currently taking metoprolol clonidine lisinopril and just started taking isosorbide this morning   Patient concerned bp may be too low

## 2021-01-04 ENCOUNTER — Other Ambulatory Visit: Payer: Self-pay | Admitting: *Deleted

## 2021-01-04 DIAGNOSIS — I10 Essential (primary) hypertension: Secondary | ICD-10-CM

## 2021-01-04 MED ORDER — CLOPIDOGREL BISULFATE 75 MG PO TABS: 75.0000 mg | ORAL_TABLET | Freq: Every day | ORAL | 0 refills | Status: DC

## 2021-01-04 MED ORDER — METOPROLOL TARTRATE 25 MG PO TABS: 25.0000 mg | ORAL_TABLET | Freq: Two times a day (BID) | ORAL | 0 refills | Status: DC

## 2021-01-04 MED ORDER — LISINOPRIL 10 MG PO TABS: 10.0000 mg | ORAL_TABLET | Freq: Every day | ORAL | 0 refills | Status: DC

## 2021-01-09 ENCOUNTER — Other Ambulatory Visit: Payer: Self-pay | Admitting: Physician Assistant

## 2021-01-24 ENCOUNTER — Other Ambulatory Visit
Admission: RE | Admit: 2021-01-24 | Discharge: 2021-01-24 | Disposition: A | Discharge status: Home / Self Care | Payer: Medicare HMO | Attending: Physician Assistant | Admitting: Physician Assistant

## 2021-01-24 DIAGNOSIS — I10 Essential (primary) hypertension: Secondary | ICD-10-CM | POA: Diagnosis present

## 2021-01-24 DIAGNOSIS — Z79899 Other long term (current) drug therapy: Secondary | ICD-10-CM | POA: Diagnosis present

## 2021-01-24 LAB — BASIC METABOLIC PANEL
Anion gap: 11 (ref 5–15)
BUN: 14 mg/dL (ref 8–23)
CO2: 24 mmol/L (ref 22–32)
Calcium: 9.2 mg/dL (ref 8.9–10.3)
Chloride: 100 mmol/L (ref 98–111)
Creatinine, Ser: 0.64 mg/dL (ref 0.44–1.00)
GFR, Estimated: >60 mL/min (ref >60)
Glucose, Bld: 101 mg/dL — ABNORMAL HIGH (ref 70–99)
Potassium: 4.4 mmol/L (ref 3.5–5.1)
Sodium: 135 mmol/L (ref 135–145)

## 2021-01-31 ENCOUNTER — Other Ambulatory Visit: Payer: Self-pay | Admitting: Thoracic Surgery (Cardiothoracic Vascular Surgery)

## 2021-01-31 DIAGNOSIS — Z951 Presence of aortocoronary bypass graft: Secondary | ICD-10-CM

## 2021-02-01 ENCOUNTER — Ambulatory Visit (INDEPENDENT_AMBULATORY_CARE_PROVIDER_SITE_OTHER): Payer: Self-pay | Admitting: Surgical

## 2021-02-01 ENCOUNTER — Other Ambulatory Visit: Payer: Self-pay

## 2021-02-01 ENCOUNTER — Ambulatory Visit
Admission: RE | Admit: 2021-02-01 | Discharge: 2021-02-01 | Disposition: A | Discharge status: Home / Self Care | Payer: Medicare HMO | Source: Ambulatory Visit | Attending: Thoracic Surgery (Cardiothoracic Vascular Surgery) | Admitting: Thoracic Surgery (Cardiothoracic Vascular Surgery)

## 2021-02-01 VITALS — BP 178/96 | HR 80 | Resp 20 | Ht 64.0 in | Wt 185.0 lb

## 2021-02-01 DIAGNOSIS — Z951 Presence of aortocoronary bypass graft: Secondary | ICD-10-CM

## 2021-02-01 NOTE — Patient Instructions (Signed)
Routine advancement discussed with the patient including driving.

## 2021-02-01 NOTE — Progress Notes (Signed)
[address]       [place] [address]             [phone]      CECILE GILLISPIE [facility] Medical Record #[id number] Date of Birth: 01/22/1951  Referring: Antonieta Iba, MD Primary Care: Leim Fabry, MD Primary Cardiologist: [name], MD   Chief Complaint:   POST OP FOLLOW UP    12/13/2020 Patient:  Erica Brock Pre-Op Dx: Three-vessel coronary disease                         NSTEMI                         Obstructive sleep apnea                         Hypertension                         Hyperlipidemia                         Diabetes mellitus Post-op Dx: Same Procedure: CABG X 4, LIMA to LAD, reverse saphenous vein graft to PDA, reverse saphenous vein to OM1, reverse saphenous vein graft to first diagonal Endoscopic greater saphenous vein harvest on the right     Surgeon and Role:      * Lightfoot, Eliezer Lofts, MD - Primary    *D. Joycelyn Man, PA-C- assisting History of Present Illness:    Patient is a 70 year old female status post the above described procedure seen in the office on today's date and routine postsurgical follow-up.  She reports that she is overall doing well with slow but steady progress.  She has had no significant difficulties with her incisions.  She denies fevers, chills or other significant constitutional symptoms.  She is not having any shortness of breath or chest pain/anginal equivalents.  She does report occasionally her blood pressure runs a little bit on the low side.  She was started by cardiology on Imdur but this has been subsequently stopped.      Past Medical History:  Diagnosis Date   Anxiety    Arthritis    Diabetes mellitus without complication (HCC)    Diffuse cystic mastopathy 2012   GERD (gastroesophageal reflux disease)    Heart murmur    ETT myoview 12/09 (Dr. Juel Burrow) no evidence of ischemia, good exercise tolerance - 9 min. echo (11/10): EF 60-65%, nml RV, PASP 25, nml valves    Hepatitis    as a child    HLD (hyperlipidemia)    HTN (hypertension)    for < 10 years   Obesity    OSA (obstructive sleep apnea)    mild; uses CPAP   Rheumatic fever    as a child     Social History   Tobacco Use  Smoking Status Never  Smokeless Tobacco Never  Tobacco Comments   tobacco use - no    Social History   Substance and Sexual Activity  Alcohol Use Yes   Comment: social      Allergies  Allergen Reactions   Prednisone Other (See Comments)    Elevated BP   Atorvastatin     Muscle aches     Current Outpatient Medications  Medication Sig Dispense Refill   aspirin 81 MG  tablet Take 1 tablet (81 mg total) by mouth daily.     cloNIDine (CATAPRES) 0.1 MG tablet Take 1 tablet (0.1 mg total) by mouth 2 (two) times daily. 28 tablet 0   clopidogrel (PLAVIX) 75 MG tablet Take 1 tablet (75 mg total) by mouth daily. 90 tablet 0   ezetimibe (ZETIA) 10 MG tablet TAKE 1 TABLET DAILY 90 tablet 3   furosemide (LASIX) 20 MG tablet Take 1 tablet (20 mg total) by mouth daily as needed (weight gain 3 lbs overnight or 5 lbs in one week). 90 tablet 3   hydroxychloroquine (PLAQUENIL) 200 MG tablet Take 200 mg by mouth 2 (two) times daily.     lisinopril (ZESTRIL) 10 MG tablet Take 1 tablet (10 mg total) by mouth daily. 90 tablet 0   metFORMIN (GLUCOPHAGE-XR) 500 MG 24 hr tablet Take 500 mg by mouth at bedtime.     metoprolol tartrate (LOPRESSOR) 25 MG tablet Take 1 tablet (25 mg total) by mouth 2 (two) times daily. 180 tablet 0   montelukast (SINGULAIR) 10 MG tablet Take 10 mg by mouth daily as needed.     pantoprazole (PROTONIX) 40 MG tablet Take 40 mg by mouth daily.       potassium chloride SA (KLOR-CON) 10 MEQ tablet Take 1 tablet (10 mEq total) by mouth daily as needed (take only when you take Lasix). 30 tablet 5   rosuvastatin (CRESTOR) 40 MG tablet Take 1 tablet (40 mg total) by mouth daily. 90 tablet 3   traMADol (ULTRAM) 50 MG tablet Take 1 tablet (50 mg total) by mouth  every 6 (six) hours as needed for severe pain. 30 tablet 0   No current facility-administered medications for this visit.       Physical Exam: BP (!) 178/96   Pulse 80   Resp 20   Ht 5\' 4"  (1.626 m)   Wt 185 lb (83.9 kg)   SpO2 94% Comment: RA  BMI 31.76 kg/m   General appearance: alert, cooperative, and no distress Heart: regular rate and rhythm Lungs: clear to auscultation bilaterally Abdomen: Benign exam Extremities: No edema Wound: Incisions healing well without evidence of infection.   Diagnostic Studies & Laboratory data:     Recent Radiology Findings:   No results found.    Recent Lab Findings: Lab Results  Component Value Date   WBC 7.2 01/01/2021   HGB 11.7 01/01/2021   HCT 35.7 01/01/2021   PLT 438 01/01/2021   GLUCOSE 101 (H) 01/24/2021   CHOL 118 12/12/2020   TRIG 130 12/12/2020   HDL 61 12/12/2020   LDLCALC 31 12/12/2020   ALT 21 12/13/2020   AST 26 12/13/2020   NA 135 01/24/2021   K 4.4 01/24/2021   CL 100 01/24/2021   CREATININE 0.64 01/24/2021   BUN 14 01/24/2021   CO2 24 01/24/2021   TSH 0.962 09/04/2020   INR 1.3 (H) 12/13/2020   HGBA1C 6.3 (H) 12/10/2020      Assessment / Plan: Patient is doing very well in her postoperative recovery.  There are no significant surgically related issues at this time.  Her blood pressure was noted to be elevated in the office but she reports that this is unusual and that she was very stressed [place] on the highway.  She does check her blood pressure on a daily basis and I advised her to keep a good record and bring to her cardiology follow-up.  I did not make any changes to her current medication  regimen.  I did review the patient's chest x-ray and there are no significant worrisome findings.  Specifically she has no significant effusions or infiltrates.  The wires intact.  We will see the patient again on a as needed basis for any surgically related needs or at request.      Medication Changes: No  orders of the defined types were placed in this encounter.     Rowe Clack, PA-C  02/01/2021 3:02 PM

## 2021-02-20 ENCOUNTER — Other Ambulatory Visit: Payer: Self-pay

## 2021-02-20 ENCOUNTER — Ambulatory Visit (INDEPENDENT_AMBULATORY_CARE_PROVIDER_SITE_OTHER): Payer: Medicare HMO

## 2021-02-20 DIAGNOSIS — Z951 Presence of aortocoronary bypass graft: Secondary | ICD-10-CM

## 2021-02-20 DIAGNOSIS — I080 Rheumatic disorders of both mitral and aortic valves: Secondary | ICD-10-CM

## 2021-02-20 DIAGNOSIS — I5020 Unspecified systolic (congestive) heart failure: Secondary | ICD-10-CM | POA: Diagnosis not present

## 2021-02-20 LAB — ECHOCARDIOGRAM COMPLETE
AR max vel: 1.93 cm2
AV Area VTI: 1.79 cm2
AV Area mean vel: 2.05 cm2
AV Mean grad: 5 mmHg
AV Peak grad: 9.9 mmHg
Ao pk vel: 1.57 m/s
Area-P 1/2: 3.39 cm2
Calc EF: 52 %
S' Lateral: 2.8 cm
Single Plane A2C EF: 51 %
Single Plane A4C EF: 53.3 %

## 2021-02-22 ENCOUNTER — Ambulatory Visit: Payer: Medicare HMO | Admitting: Physician Assistant

## 2021-02-22 ENCOUNTER — Encounter: Payer: Self-pay | Admitting: Physician Assistant

## 2021-02-22 ENCOUNTER — Encounter: Payer: Self-pay | Admitting: *Deleted

## 2021-02-22 ENCOUNTER — Other Ambulatory Visit: Payer: Self-pay

## 2021-02-22 ENCOUNTER — Telehealth: Payer: Self-pay | Admitting: Physician Assistant

## 2021-02-22 VITALS — BP 122/80 | HR 76 | Ht 64.0 in | Wt 185.0 lb

## 2021-02-22 DIAGNOSIS — Z951 Presence of aortocoronary bypass graft: Secondary | ICD-10-CM

## 2021-02-22 DIAGNOSIS — E785 Hyperlipidemia, unspecified: Secondary | ICD-10-CM

## 2021-02-22 DIAGNOSIS — G4733 Obstructive sleep apnea (adult) (pediatric): Secondary | ICD-10-CM | POA: Diagnosis not present

## 2021-02-22 DIAGNOSIS — I10 Essential (primary) hypertension: Secondary | ICD-10-CM | POA: Diagnosis not present

## 2021-02-22 DIAGNOSIS — I340 Nonrheumatic mitral (valve) insufficiency: Secondary | ICD-10-CM

## 2021-02-22 DIAGNOSIS — E119 Type 2 diabetes mellitus without complications: Secondary | ICD-10-CM

## 2021-02-22 DIAGNOSIS — I25110 Atherosclerotic heart disease of native coronary artery with unstable angina pectoris: Secondary | ICD-10-CM

## 2021-02-22 DIAGNOSIS — I5032 Chronic diastolic (congestive) heart failure: Secondary | ICD-10-CM

## 2021-02-22 DIAGNOSIS — I779 Disorder of arteries and arterioles, unspecified: Secondary | ICD-10-CM

## 2021-02-22 DIAGNOSIS — I252 Old myocardial infarction: Secondary | ICD-10-CM

## 2021-02-22 DIAGNOSIS — I25118 Atherosclerotic heart disease of native coronary artery with other forms of angina pectoris: Secondary | ICD-10-CM

## 2021-02-22 DIAGNOSIS — Z9989 Dependence on other enabling machines and devices: Secondary | ICD-10-CM

## 2021-02-22 NOTE — Progress Notes (Signed)
,   Office Visit    Patient Name: Erica Brock Date of Encounter: 02/22/2021  PCP:  Leim Fabry, MD   [facility]  Cardiologist:  [name], MD  Advanced Practice Provider:  Lennon Alstrom, PA-C Electrophysiologist:  None   Chief Complaint    Chief Complaint  Patient presents with   Follow-up    Follow up to review echo results and patient c/o BP running lower at home. Medications verbally reviewed with patient.     70 y.o. female coronary artery disease s/p CABG x4 12/13/20 (LIMA to LAD,rev SVG to PDA, rev SVG to OM1, rev SVG to Diag1) due to 3v CAD by cath after NSTEMI with abnormal MPI, aortic atherosclerosis, mild MR,  hypertension, hyperlipidemia, obesity, OSA, DM2, history of rheumatic fever, GERD, history of DVT/pulmonary embolism in 2018 in the setting of surgery, rheumatoid arthritis, osteoarthritis, anxiety, history of cystic mastopathy, and who is being seen today for follow-up after NSTEMI with cath showing significant 3v CAD and transfer to [facility] for CABG with greater saphenous VG on the right by Dr. Cliffton Asters 12/13/20.  Past Medical History    Past Medical History:  Diagnosis Date   Anxiety    Arthritis    Diabetes mellitus without complication (HCC)    Diffuse cystic mastopathy 2012   GERD (gastroesophageal reflux disease)    Heart murmur    ETT myoview 12/09 (Dr. Juel Burrow) no evidence of ischemia, good exercise tolerance - 9 min. echo (11/10): EF 60-65%, nml RV, PASP 25, nml valves   Hepatitis    as a child    HLD (hyperlipidemia)    HTN (hypertension)    for < 10 years   Obesity    OSA (obstructive sleep apnea)    mild; uses CPAP   Rheumatic fever    as a child   Past Surgical History:  Procedure Laterality Date   arthroscopic surgery     L elbow    BREAST BIOPSY Left    needle bx-neg   carpael tunnel release     R hand   COLONOSCOPY  2008   Dr. Sharen Hint   CORONARY ARTERY BYPASS GRAFT N/A 12/13/2020    Procedure: CORONARY ARTERY BYPASS GRAFTING (CABG) TIMES FOUR USING RIGHT GREATER SAPHENOUS VEIN HARVESTED ENDOSCOPICALLY AND LEFT INTERNAL MAMMARY ARTERY.;  Surgeon: Corliss Skains, MD;  Location: [facility] OR;  Service: Open Heart Surgery;  Laterality: N/A;   FOOT SURGERY Left    LEFT HEART CATH AND CORONARY ANGIOGRAPHY N/A 12/11/2020   Procedure: LEFT HEART CATH AND CORONARY ANGIOGRAPHY;  Surgeon: [name], MD;  Location: [facility] INVASIVE CV LAB;  Service: Cardiovascular;  Laterality: N/A;   reconstruction foot and bunion surgery - both feet     TEE WITHOUT CARDIOVERSION N/A 12/13/2020   Procedure: TRANSESOPHAGEAL ECHOCARDIOGRAM (TEE);  Surgeon: Corliss Skains, MD;  Location: [facility] OR;  Service: Open Heart Surgery;  Laterality: N/A;   TONSILLECTOMY     TOTAL ABDOMINAL HYSTERECTOMY  1994    Allergies  Allergies  Allergen Reactions   Prednisone Other (See Comments)    Elevated BP   Atorvastatin     Muscle aches     History of Present Illness    Erica Brock is a 70 y.o. female with PMH as above.   CAC Score 2407 in 2018 with diffuse LM and 3v CAC. 2018 MPI ruled low risk though moderate in size defect noted as below and thought to be 2/2 artifact and  less likely ischemia.     Echo 09/19/20 showed nl EF and mild MR.    She is followed by [facility] cardiology for HTN and HLD with last office visit 10/05/2020.   She has no history of tobacco use.  She reports occasional alcohol use with 1-2 drinks each occasion.  Family history includes a sister with myocardial infarction and CABG.  He has personal history of a pulmonary embolism in 2018 in the setting of surgery and is s/p OAC.     When seen in office 10/05/20, BP was elevated at 152/90; however home BP routinely SBP 1 10-1 20 and DBP 60 to 80s.  Home BP cuff checked against that of clinic and found to be 10 points higher on systolic and diastolic readings.  No medication changes were made to her clonidine 0.1 mg twice daily and  lisinopril 40 mg daily.   On 12/10/2020, she presented to [facility] ED after experiencing nausea, emesis, and diarrhea that occurred in close proximity to taking a multivitamin. Diarrhea described as liquid. After her nausea, emesis, and diarrhea, she felt weak with abdominal discomfort. She had a similar episode that occurred shortly after taking her multivitamin before in the past.  No CP/SOB/DOE leading up to her ED visit. She was walking for physical activity. She usually did not walk far but denied any exertional sx / limiting CP/DOE. No CP/SOB with hills or stairs. No LEE other than that chronically associated with an ankle injury and surgery.  She has intermittent abdominal bloating without clear triggers and at times making it difficult for her to fasten her bra.  No orthopnea or other reported sx of volume overload. Her BP has been well controlled on her current medications.   In the emergency department, she was tachycardic. Labs showed AKI with creatinine 1.04, albumin elevated 5.4, leukocytosis with WBC 17.8, platelets elevated at 403, high-sensitivity troponin peaked at 329.  EKG NSR with LAFB and incomplete RBBB at 84 bpm and no significant ST/T changes. Given RF for CAD, recommendation was for MPI that was ruled high risk with mid anteroseptal and apical septal defect suggestive of scar and reversible basal and mid anterolateral / inferolateral defect consistent with ischemia. CP reported after regadenoson. Subsequent LHC 5/24 showed heavily calcified coronary arteries with significant 3v disease as outlined below under CV studies. Recommendation was for transfer to [facility] for evaluation by CTS.   She was seen by CTS with CABG x4 12/13/20 by Dr. Cliffton Asters using R SVG harvest (LIMA to LAD,rev SVG to PDA, rev SVG to OM1, rev SVG to Diag1) ==. She was then started on lopressor and diuresed. It was noted she likely has prediabetes with information provided at discharge. She had expected post op blood  loss anemia that did not require transfusion. She was hypertensive post op. Lisinopril  daily was restarted and hydralazine added 12/15/20. Clonidine was restarted 0.1mg  BID on 5/29 and hydralazine then stopped. She had runs of SVT and PACs and was more tachycardic on 12/17/20. Lopressor increased to 37.5 mg twice daily and then again to 50 mg twice daily given ongoing tachycardia.  Case was discussed with Dr. Cliffton Asters and aspirin decreased to 81 mg daily with Plavix started, given presentation with non-STEMI.     Discharge medications included ASA 81 mg daily, Plavix 75 mg daily, Lopressor 50 mg twice daily, KCl tab 20 M EQ, clonidine 0.1 mg twice daily, Lasix 40 mg daily, Zetia 10 mg daily, lisinopril 40 mg daily, pantoprazole 40 mg  daily, Crestor 40 mg daily.  Only 4 days of potassium and diuresis were recommended/provided at discharge. At discharge, BP 131/75 with HR 97 bpm.  Weight 86.9 kg.  Trace bilateral lower extremity edema was noted on exam.   She has had a telemedicine visit 12/28/2020 with Dr. Cliffton Asters and reported ambulating 20 minutes daily.  BP was on the lower side.  She reported poor appetite.  Metoprolol was decreased to 25 mg daily.  It was recommended she follow-up in 1 month with chest x-ray.  Seen 01/01/2021 and doing well since her bypass.  She reported some low energy.  She had bibasilar reduced breath sounds with diuresis increased for 3 days then drop down to as needed basis.  Imdur was started, but due to ongoing low blood pressure, Imdur was eventually discontinued and lisinopril dropped to 10 mg daily.  It was discussed that weaning off of clonidine was the plan for the near future.  Chest x-ray 02/01/2021 with no acute intrathoracic process.  She was seen by CTS 7/15 with no medication changes made.  BP was noted to be elevated at 178/96.  Echo 02/20/2021 showed EF 50 to 55%, NR WMA, low normal RVSF mild MR, mild LAE, mild RVE, mild aortic sclerosis without stenosis, RAP 3  mmHg.  Today, 03/04/2021, she continues to note low energy.  Her low energy is worse when her BP is very low.  She has declined cardiac rehab but agreeable to daily exercise.  She currently has an elliptical machine in the basement but may switch this out with a treadmill through a sports consignment shop.  We discussed walking daily on the treadmill with her husband home to supervise and also monitoring of vitals during that time.  She understands the importance of daily exercise and recovery.  She reports some chest pain or rather tenderness along her incisions that occurs without clear triggers and lasts for short time before dissipating.  No "deep chest pain." Home SBP 116-103.  She recalls the elevated blood pressure at her appointment on 7/15 and states that this was due to stress when going through traffic.  She reports stable breathing.  No tachypalpitations.  No orthopnea, PND, or early satiety.  She feels about the same as her last visit. She reports taking Lasix once per week for fluid.  In the house, her husband has been doing the majority of the chores.  She request clarification regarding her post CABG instructions with pushing, pulling, and driving, and with CTS PA-C contacted same day and clarification provided as below. She reports medication compliance.  She is compliant with her CPAP.  She reports that she did not test positive for sleep apnea, but given her difficulty with sleeping at night, she was prescribed a CPAP.  Echo was reviewed with recommendation that we consider titration of her CPAP in the future if indicated on repeat echo.  No report of s/sx consistent with bleeding. ----- Per CTS Mr. Doylene Canning, PA-C (on asking about restrictions): "push/pull restrictions are for 3 months post surgery, no lifting more than 20 lbs until 3 months after surgery. She can drive un restricted but if long driveshould take breaks to mobilize every 1-2 hours" ------- See BP log below under CV studies for  most recent BP/HR at home - transcribed under this section  Home Medications   Current Outpatient Medications  Medication Instructions   aspirin EC 81 mg, Oral, Daily   cloNIDine (CATAPRES) 0.1 mg, Oral, 2 times daily   clopidogrel (PLAVIX)  75 mg, Oral, Daily   ezetimibe (ZETIA) 10 MG tablet TAKE 1 TABLET DAILY   furosemide (LASIX) 20 mg, Oral, Daily PRN   hydroxychloroquine (PLAQUENIL) 200 mg, Oral, 2 times daily   lisinopril (ZESTRIL) 10 mg, Oral, Daily   metFORMIN (GLUCOPHAGE-XR) 500 mg, Oral, Daily at bedtime   metoprolol tartrate (LOPRESSOR) 25 mg, Oral, 2 times daily   montelukast (SINGULAIR) 10 mg, Oral, Daily PRN   pantoprazole (PROTONIX) 40 mg, Daily   potassium chloride SA (KLOR-CON) 10 MEQ tablet 10 mEq, Oral, Daily PRN   rosuvastatin (CRESTOR) 40 mg, Oral, Daily     Review of Systems    She reports ongoing fatigue and some CP or tenderness pain along the incision lines of her skin. She denies any "deep CP," palpitations, dyspnea, pnd, orthopnea, n, v, dizziness, syncope, edema, weight gain, or early satiety.  All other systems reviewed and are otherwise negative except as noted above.  Physical Exam    VS:  BP 122/80 (BP Location: Left Arm, Patient Position: Sitting, Cuff Size: Normal)   Pulse 76   Ht  (1.626 m)   Wt 185 lb (83.9 kg)   SpO2 97%   BMI 31.76 kg/m  , BMI Body mass index is 31.76 kg/m. GEN: Well nourished, well developed, in no acute distress. Joined by her husband. HEENT: normal. Neck: Supple, no JVD, carotid bruits, or masses. Cardiac: RRR, 1/6 systolic murmur. No rubs, or gallops. No clubbing, cyanosis, edema.  Radials/DP/PT 2+ and equal bilaterally. Sternal incision / RLE/abdominal incisions healing well.  Respiratory:  Respirations regular and unlabored, CTAB. GI: Soft, nontender, nondistended, BS + x 4. MS: no deformity or atrophy. Skin: warm and dry, no rash. Neuro:  Strength and sensation are intact. Psych: Normal affect.  Accessory  Clinical Findings    ECG personally reviewed by me today - no EKG.  VITALS Reviewed today   Temp Readings from Last 3 Encounters:  12/18/20 98.5 F (36.9 C) (Oral)  12/11/20 98.6 F (37 C) (Oral)  11/22/17 98.5 F (36.9 C) (Oral)   BP Readings from Last 3 Encounters:  02/22/21 122/80  02/01/21 (!) 178/96  01/01/21 112/78   Pulse Readings from Last 3 Encounters:  02/22/21 76  02/01/21 80  01/01/21 (!) 59    Wt Readings from Last 3 Encounters:  02/22/21 185 lb (83.9 kg)  02/01/21 185 lb (83.9 kg)  01/01/21 185 lb 6.4 oz (84.1 kg)     LABS  reviewed today   Lab Results  Component Value Date   WBC 7.2 01/01/2021   HGB 11.7 01/01/2021   HCT 35.7 01/01/2021   MCV 91 01/01/2021   PLT 438 01/01/2021   Lab Results  Component Value Date   CREATININE 0.64 01/24/2021   BUN 14 01/24/2021   NA 135 01/24/2021   K 4.4 01/24/2021   CL 100 01/24/2021   CO2 24 01/24/2021   Lab Results  Component Value Date   ALT 21 12/13/2020   AST 26 12/13/2020   ALKPHOS 41 12/13/2020   BILITOT 0.7 12/13/2020   Lab Results  Component Value Date   CHOL 118 12/12/2020   HDL 61 12/12/2020   LDLCALC 31 12/12/2020   TRIG 130 12/12/2020   CHOLHDL 1.9 12/12/2020    Lab Results  Component Value Date   HGBA1C 6.3 (H) 12/10/2020   Lab Results  Component Value Date   TSH 0.962 09/04/2020     STUDIES/PROCEDURES reviewed today   Echo 02/20/21  1. Left ventricular  ejection fraction, by estimation, is 50 to 55%. The  left ventricle has low normal function. The left ventricle has no regional  wall motion abnormalities. Left ventricular diastolic parameters are  indeterminate.   2. Right ventricular systolic function is low normal. The right  ventricular size is mildly enlarged.   3. Left atrial size was mildly dilated.   4. The mitral valve is grossly normal. Mild mitral valve regurgitation.   5. The aortic valve is tricuspid. Aortic valve regurgitation is not  visualized. Mild  aortic valve sclerosis is present, with no evidence of  aortic valve stenosis.   6. The inferior vena cava is normal in size with greater than 50%  respiratory variability, suggesting right atrial pressure of 3 mmHg.   CABG 12/13/20 CABG X 4, LIMA to LAD, reverse saphenous vein graft to PDA, reverse saphenous vein to OM1, reverse saphenous vein graft to first diagonal Endoscopic greater saphenous vein harvest on the rightGood LIMA, good vein conduit.  Heavily calcified LAD.  Partially intramyocardial obtuse marginal.  Small PDA.  Good-quality diagonal.  Intraoperative TEE 12/13/20 POST-OP IMPRESSIONS  - Left Ventricle: has normal systolic function, with an ejection fraction  of  55%.  - Right Ventricle: The right ventricle appears unchanged from pre-bypass.  - Aorta: The aorta appears unchanged from pre-bypass.  - Left Atrium: The left atrium appears unchanged from pre-bypass.  - Left Atrial Appendage: The left atrial appendage appears unchanged from  pre-bypass.  - Aortic Valve: The aortic valve appears unchanged from pre-bypass.  - Mitral Valve: The mitral valve appears unchanged from pre-bypass.  - Tricuspid Valve: The tricuspid valve appears unchanged from pre-bypass.  - Pulmonic Valve: The pulmonic valve appears unchanged from pre-bypass.  - Interatrial Septum: The interatrial septum appears unchanged from  pre-bypass.  - Interventricular Septum: The interventricular septum appears unchanged  from  pre-bypass.  - Pericardium: The pericardium appears unchanged from pre-bypass.  PRE-OP FINDINGS   Left Ventricle: The left ventricle has mildly reduced systolic function,  with an ejection fraction of 45-50%. The cavity size was normal. There is  mild concentric left ventricular hypertrophy.   Vas US Doppler pre CABG/Carotid and arms 12/12/20 Summary:  Right Carotid: Velocities in the right ICA are consistent with a 1-39%  stenosis.  Left Carotid: Velocities in the left ICA are  consistent with a 1-39%  stenosis.  Vertebrals: Bilateral vertebral arteries demonstrate antegrade flow.  Right Upper Extremity: Doppler waveforms remain within normal limits with  right radial compression. Doppler waveforms decrease 50% with right ulnar  compression.  Left Upper Extremity: Doppler waveform obliterate with left radial  compression. Doppler waveforms remain within normal limits with left ulnar  compression.      LHC  12/11/2020 Severe three-vessel coronary artery disease with heavy calcification, including 40% mid LMCA stenosis, sequential 40% mid and 70-80% mid/distal LAD lesions, 50% proximal LCx disease at takeoff of large OM branch followed by sequential 70% and 99% stenoses in OM1, and chronic total occlusion of dominant RCA with left to right and right to right collaterals. Mildly elevated left ventricular filling pressure. Given three-vessel coronary artery disease including subtotal/total occlusions of OM1 and RCA, heavy calcification throughout the coronary arteries, and history of diabetes mellitus, transfer to [facility] for surgical consultation for CABG is recommended. Start IV heparin 2 hours after TR band removal. Obtain echocardiogram. Aggressive secondary prevention.  Diagnostic Dominance: Right    MPI 12/11/20 Abnormal pharmacologic myocardial perfusion stress test. There is a moderate in size, severe,  reversible defect involving the basal and mid anterolateral and inferolateral segments consistent with ischemia. There is a small in size, severe, fixed mid anteroseptal and apical septal defect consistent with scar. The left ventricular ejection fraction is mildly decreased (48%). Attenuation correction CT demonstrates coronary artery calcification and aortic atherosclerosis. This is a high risk study.   Echo 09/19/20  1. Left ventricular ejection fraction, by estimation, is 55 to 60%. Left  ventricular ejection fraction by 3D volume is 59 %. The left  ventricle has  normal function. The left ventricle has no regional wall motion  abnormalities. There is mild left  ventricular hypertrophy. Left ventricular diastolic parameters are  indeterminate. The average left ventricular global longitudinal strain is  -13.6 %. The global longitudinal strain is abnormal.   2. Right ventricular systolic function is normal. The right ventricular  size is normal.   3. The mitral valve is grossly normal. Mild mitral valve regurgitation.   4. The aortic valve is tricuspid. Aortic valve regurgitation is trivial.  Mild aortic valve sclerosis is present, with no evidence of aortic valve  stenosis.   5. The inferior vena cava is normal in size with <50% respiratory  variability, suggesting right atrial pressure of 8 mmHg.   MPI 2018 Low risk study. There is a moderate in size, mild in severity, partially reversible defect involving the apical inferior, apical lateral, and apical segments. Given normal wall motion on gated images, this most likely represents artifact (apical thinning and attenuation) and less likely ischemia. Fair exercise capacity without significant ST-segment or T-wave changes. Normal left ventricular contraction (LVEF 65%).   CAC score 2018 FINDINGS: Non-cardiac: See separate report from [facility]. Ascending Aorta:  3.6 cm Pericardium: Normal Coronary arteries: Marked diffuse LM and three vessel coronary artery calcification IMPRESSION: Coronary calcium score of 2407 . This was 13 th percentile for age and sex matched control.   Would recommend f/u perfusion study given how high calcium score is ------- BP Log 7/27: 8 AM, 116/77, 58 bpm  /  8:30 PM, 103/64, 77 bpm 7/28: 8 AM, 99/65, 58 bpm  /  10 PM 109/72, 59 bpm 7/29: 8:30 AM, 92/56, 56 bpm  /  8:30 PM 107/70, 65 bpm 7/30: 8:30 AM 104/76, 59 bpm 7/31 12:30 AM 92/57, 52 bpm  /  8 PM 93/58, 79 bpm 8/1 9 AM 90/61, 56 bpm  /  12 PM 88/55, 60 bpm  /  10:30 PM 99/60,  68 bpm 8/2 9:30 AM 90/58, 51 bpm  /  9 PM 106/71, 70 bpm 8/30 8:30 AM 148/88 at echo  /  3 PM 105/71, 58 bpm  /  9:35 PM 98/62, 64 bpm 8/4 8:30 AM 98/66, 54 bpm  /  8:30 PM 91/62, 62 bpm   Assessment & Plan    History of NSTEMI (12/10/20) 3v CAD s/p CABG x4 (12/13/20) --Reports skin pain/tenderness but no deep chest pain.  Incision sites healing well s/p CABG x4 (LIMA to LAD,rev SVG to PDA, rev SVG to OM1, rev SVG to Diag1).  01/2021 chest x-ray without acute findings.  Most recent echo with EF 50 to 55%, NRWMA. Continue ASA 81 mg daily, Plavix 75 mg daily, Lopressor 25 mg BID, Crestor 40 mg daily, Zetia 10 mg daily.  She did not tolerate Imdur 30 mg daily due to low BP, though this may be able to be restarted in the future and once weaned off of clonidine (instructions to wean clonidine as below). Future recommendations include  consolidation of Lopressor to Toprol-XL at RTC & once weaned off of clonidine as below.  Will defer for now. Recommend increase activity as tolerated.  She politely declines cardiac rehab. Per CTS Mr. Doylene Canning, PA-C recommendations: "push/pull restrictions are for 3 months post surgery, no lifting more than 20 lbs until 3 months after surgery. She can drive un restricted but if long driveshould take breaks to mobilize every 1-2 hours"  Chronic HFmrEF -- No signs or symptoms of volume overload.  Euvolemic and well compensated on exam.  02/2021 echo as above with EF low normal at 50 to 55%, mild RVE, low normal RV SF, mild LAE, mild MR, mild aortic sclerosis.  Continue PRN lasix  daily (3lb overnight, 5lb in 1 week) and KCL tab (on days she takes her lasix). Continue lisinopril  daily. Continue Lopressor 25 mg twice daily with consolidation to Toprol at RTC and once weaned off of clonidine and BB dose confirmed. Maintain fluids under 2 L/day, salt under 2 g/day. Repeat echo in 6-40mo to reassess EF, given low normal pump function.   We did discuss possible pulmonary  referral in the future if indicated by repeat echo given findings on most recent echo.  She has a CPAP but no previous diagnosis of OSA. Given LAE and RVSF, will want to monitor this going forward. For now she will continue her CPAP and plan for the updated echo.  HLD, goal LDL <70 --12/12/2020 LDL 31 and at goal below 70.  Continue Crestor 40 mg and Zetia 10 mg daily with target LDL less than 70.    Bilateral mild carotid dz --See carotids under CV studies. Bilateral carotids with mild dz at 1-39%. Continue to monitor sx and update imaging as indicated. Continue ASA and statin. LDL goal under 70.  Hypertension, goal BP <130/80 --BP today soft. Continue to monitor BP at home with log of most recent BP transcribed as above.  Continue lisinopril  qd. Wean off of clonidine, starting today. Clonidine 0.1 mg once daily x4 days, followed by clonidine every other day for 4 days, followed by complete cessation of clonidine. She will call the office if BP elevated for more than 3 days in a row. Once weaned off of clonidine, change from lopressor to Toprol XL at RTC and once we have confirmed this is the appropriate dose.  Mild MR, Aortic sclerosis  --Periodic echo to monitor. See CV studies above.  DM2 --Hemoglobin A1c noted to be elevated at 6.3 during admission.   Continue metformin per PCP. Ongoing glycemic control per PCP. Continue lisinopril given comorbid DM2.  Sleep disordered breathing --Continue CPAP. If repeat echo results show more dilation of LA or RVE, consider pulmonary referral at that time for titration.  ---------------------------------------------------------------------------   Medication changes:  --Wean clonidine: Clonidine 0.1mg  once per day for 4 days, then every other day for 4 days, and then completely stop this medication.  Labs ordered:  --None Studies / Imaging ordered:  --None  Future considerations:  --Following weaning from clonidine- room in BP to  restart Imdur? Escalate lisinopril? Escalate BB? --Consolidate BB to Toprol once dosing confirmed --Repeat echo to monitor in 38mo -12 mo --Pulmonary referral if indicated by repeat echo Disposition:  --RTC 3 months with Dr. Okey Dupre if available   *Please be aware that the above documentation was completed voice recognition software and may contain dictation errors.      Lennon Alstrom, PA-C 02/22/2021

## 2021-02-22 NOTE — Patient Instructions (Addendum)
Please start taking clonidine once per day for 4 days, then every other day for 4 days, and then completely stop this medication.   Please be sure to keep an eye on your blood pressure and take an extra 1 lisinopril if BP running consistently higher than 140 on the top. If you are needing to take the extra lisinopril for 3 days in a row, please call the office, so that we can check your kidney function after that time.   Please monitor blood pressures and keep a log of your readings.   Make sure to check 2 hours after your medications.   AVOID these things for 30 minutes before checking your blood pressure: No Drinking caffeine. No Drinking alcohol. No Eating. No Smoking. No Exercising.  Five minutes before checking your blood pressure: Pee. Sit in a dining chair. Avoid sitting in a soft couch or armchair. Be quiet. Do not talk.   Medication Instructions:  Changes as listed above.   *If you need a refill on your cardiac medications before your next appointment, please call your pharmacy*   Lab Work: None  If you have labs (blood work) drawn today and your tests are completely normal, you will receive your results only by: MyChart Message (if you have MyChart) OR A paper copy in the mail If you have any lab test that is abnormal or we need to change your treatment, we will call you to review the results.   Testing/Procedures: None   Follow-Up: At [facility], you and your health needs are our priority.  As part of our continuing mission to provide you with exceptional heart care, we have created designated Provider Care Teams.  These Care Teams include your primary Cardiologist (physician) and Advanced Practice Providers (APPs -  Physician Assistants and Nurse Practitioners) who all work together to provide you with the care you need, when you need it.   Your next appointment:   3 month(s)  The format for your next appointment:   In Person  Provider:   [name], MD

## 2021-02-22 NOTE — Telephone Encounter (Signed)
Reviewed with provider on instructions regarding post CABG activities.   No heavy lifting No pushing or pulling for 3 months No lifting more than 20 pounds until 3 months after surgery If you drive for a long trip you need to stop every 1-2 hours to walk around No yard work  She stated that at her last appointment there at Dr. Lucilla Lame office they did clear her to drive. Instructed her to increase activities slowly and if any concerns with regards to her surgery she can call Dr. Lucilla Lame office. She verbalized understanding of our conversation, agreement with plan, and had no further questions at this time.

## 2021-03-02 ENCOUNTER — Encounter: Payer: Self-pay | Admitting: Nurse Practitioner

## 2021-03-02 ENCOUNTER — Telehealth: Payer: Medicare HMO | Admitting: Nurse Practitioner

## 2021-03-02 DIAGNOSIS — U071 COVID-19: Secondary | ICD-10-CM | POA: Diagnosis not present

## 2021-03-02 MED ORDER — MOLNUPIRAVIR EUA 200MG CAPSULE: 4.0000 | ORAL_CAPSULE | Freq: Two times a day (BID) | ORAL | 0 refills | Status: AC

## 2021-03-02 MED ORDER — BENZONATATE 100 MG PO CAPS: 100.0000 mg | ORAL_CAPSULE | Freq: Three times a day (TID) | ORAL | 0 refills | Status: DC | PRN

## 2021-03-02 NOTE — Patient Instructions (Signed)
You are being prescribed MOLNUPIRAVIR for COVID-19 infection.     Please pick up your prescription at: [facility] called into [place]   Please call the pharmacy or go through the drive through vs going inside if you are picking up the mediation yourself to prevent further spread. If prescribed to a [facility] affiliated pharmacy, a pharmacist will bring the medication out to your car.   ADMINISTRATION INSTRUCTIONS: Take with or without food. Swallow the tablets whole. Don't chew, crush, or break the medications because it might not work as well  For each dose of the medication, you should be taking FOUR tablets at one time, TWICE a day   Finish your full five-day course of Molnupiravir even if you feel better before you're done. Stopping this medication too early can make it less effective to prevent severe illness related to COVID19.    Molnupiravir is prescribed for YOU ONLY. Don't share it with others, even if they have similar symptoms as you. This medication might not be right for everyone.   Make sure to take steps to protect yourself and others while you're taking this medication in order to get well soon and to prevent others from getting sick with COVID-19.   **If you are of childbearing potential (any gender) - it is advised to not get pregnant while taking this medication and recommended that condoms are used for female partners the next 3 months after taking the medication out of extreme caution    COMMON SIDE EFFECTS: Diarrhea Nausea  Dizziness    If your COVID-19 symptoms get worse, get medical help right away. Call 911 if you experience symptoms such as worsening cough, trouble breathing, chest pain that doesn't go away, confusion, a hard time staying awake, and pale or blue-colored skin. This medication won't prevent all COVID-19 cases from getting worse.

## 2021-03-02 NOTE — Progress Notes (Signed)
Virtual Visit Consent   Erica Brock, you are scheduled for a virtual visit with Erica Daphine Deutscher, FNP, a [facility] provider, today.     Just as with appointments in the office, your consent must be obtained to participate.  Your consent will be active for this visit and any virtual visit you may have with one of our providers in the next 365 days.     If you have a MyChart account, a copy of this consent can be sent to you electronically.  All virtual visits are billed to your insurance company just like a traditional visit in the office.    As this is a virtual visit, video technology does not allow for your provider to perform a traditional examination.  This may limit your provider's ability to fully assess your condition.  If your provider identifies any concerns that need to be evaluated in person or the need to arrange testing (such as labs, EKG, etc.), we will make arrangements to do so.     Although advances in technology are sophisticated, we cannot ensure that it will always work on either your end or our end.  If the connection with a video visit is poor, the visit may have to be switched to a telephone visit.  With either a video or telephone visit, we are not always able to ensure that we have a secure connection.     I need to obtain your verbal consent now.   Are you willing to proceed with your visit today? YES   Erica Brock has provided verbal consent on 03/02/2021 for a virtual visit (video or telephone).   Erica Daphine Deutscher, FNP   Date: 03/02/2021 10:05 AM   Virtual Visit via Video Note   I, Erica Brock, connected with Erica Brock ([id number], 71-Aug-1952) on 03/02/21 at 10:15 AM EDT by a video-enabled telemedicine application and verified that I am speaking with the correct person using two identifiers.  Location: Patient: Virtual Visit Location Patient: Home Provider: Virtual Visit Location Provider: Mobile   I discussed the limitations of  evaluation and management by telemedicine and the availability of in person appointments. The patient expressed understanding and agreed to proceed.    History of Present Illness: Erica Brock is a 70 y.o. who identifies as a female who was assigned female at birth, and is being seen today for covid.  HPI Patient said he started headache, running nose and cough. This morning she had nausea and diarrhea. She tested positive for covid this morning.  Review of Systems  Constitutional:  Positive for chills and malaise/fatigue. Negative for fever.  HENT:  Positive for congestion and sore throat.   Respiratory:  Positive for cough and sputum production. Negative for shortness of breath.   Musculoskeletal:  Positive for myalgias.  Neurological:  Positive for headaches. Negative for dizziness.   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   Coronary artery disease 12/13/2020   Coronary artery disease involving native coronary artery of native heart with unstable angina pectoris (HCC) 12/11/2020   Abnormal stress test    Non-ST elevation (NSTEMI) myocardial infarction [facility])    Intractable nausea and vomiting 12/10/2020   Pulmonary embolism (HCC) 07/22/2017   DVT (deep venous thrombosis) (HCC) 07/22/2017   Controlled type 2 diabetes mellitus without complication, without long-term current use of insulin (HCC) 08/08/2016   Headache 07/22/2013   Fibrocystic breast 11/09/2012   Obese  09/02/2011   Insomnia 09/02/2011   OSA on CPAP 09/02/2011   OTHER MALAISE AND FATIGUE 09/11/2010   Hyperlipidemia associated with type 2 diabetes mellitus (HCC) 05/22/2009   Essential hypertension 05/22/2009   CHEST PAIN-UNSPECIFIED 05/22/2009   RHEUMATIC FEVER, HX OF 05/22/2009    Allergies:  Allergies  Allergen Reactions   Prednisone Other (See Comments)    Elevated BP   Atorvastatin     Muscle aches    Medications:  Current Outpatient Medications:     aspirin 81 MG tablet, Take 1 tablet (81 mg total) by mouth daily., Disp: , Rfl:    cloNIDine (CATAPRES) 0.1 MG tablet, Take 1 tablet (0.1 mg total) by mouth 2 (two) times daily., Disp: 28 tablet, Rfl: 0   clopidogrel (PLAVIX) 75 MG tablet, Take 1 tablet (75 mg total) by mouth daily., Disp: 90 tablet, Rfl: 0   ezetimibe (ZETIA) 10 MG tablet, TAKE 1 TABLET DAILY, Disp: 90 tablet, Rfl: 3   furosemide (LASIX) 20 MG tablet, Take 1 tablet (20 mg total) by mouth daily as needed (weight gain 3 lbs overnight or 5 lbs in one week)., Disp: 90 tablet, Rfl: 3   hydroxychloroquine (PLAQUENIL) 200 MG tablet, Take 200 mg by mouth 2 (two) times daily., Disp: , Rfl:    lisinopril (ZESTRIL) 10 MG tablet, Take 1 tablet (10 mg total) by mouth daily., Disp: 90 tablet, Rfl: 0   metFORMIN (GLUCOPHAGE-XR) 500 MG 24 hr tablet, Take 500 mg by mouth at bedtime., Disp: , Rfl:    metoprolol tartrate (LOPRESSOR) 25 MG tablet, Take 1 tablet (25 mg total) by mouth 2 (two) times daily., Disp: 180 tablet, Rfl: 0   montelukast (SINGULAIR) 10 MG tablet, Take 10 mg by mouth daily as needed., Disp: , Rfl:    pantoprazole (PROTONIX) 40 MG tablet, Take 40 mg by mouth daily.  , Disp: , Rfl:    potassium chloride SA (KLOR-CON) 10 MEQ tablet, Take 1 tablet (10 mEq total) by mouth daily as needed (take only when you take Lasix)., Disp: 30 tablet, Rfl: 5   rosuvastatin (CRESTOR) 40 MG tablet, Take 1 tablet (40 mg total) by mouth daily., Disp: 90 tablet, Rfl: 3  Observations/Objective: Patient is well-developed, well-nourished in no acute distress.  Resting comfortably  at home.  Head is normocephalic, atraumatic.  No labored breathing.  Speech is clear and coherent with logical content.  Patient is alert and oriented at baseline.  Voiuce hoarse Slight cough noted.  Assessment and Plan:  Erica Brock in today with chief complaint of No chief complaint on file.   1. Lab test positive for detection of COVID-19 virus 1. Take meds as  prescribed 2. Use a cool mist humidifier especially during the winter months and when heat has been humid. 3. Use saline nose sprays frequently 4. Saline irrigations of the nose can be very helpful if done frequently.  * 4X daily for 1 week*  * Use of a nettie pot can be helpful with this. Follow directions with this* 5. Drink plenty of fluids 6. Keep thermostat turn down low 7.For any cough or congestion  Use plain Mucinex- regular strength or max strength is fine or tessalon perels as prescribd   * Children- consult with Pharmacist for dosing 8. For fever or aces or pains- take tylenol or ibuprofen appropriate for age and weight.  * for fevers greater than 101 orally you may alternate ibuprofen and tylenol every  3 hours.   Meds ordered this encounter  Medications   molnupiravir EUA 200 mg CAPS    Sig: Take 4 capsules (800 mg total) by mouth 2 (two) times daily for 5 days.    Dispense:  40 capsule    Refill:  0    Order Specific Question:   Supervising Provider    Answer:   MILLER, BRIAN [[id number]]   benzonatate (TESSALON PERLES) 100 MG capsule    Sig: Take 1 capsule (100 mg total) by mouth 3 (three) times daily as needed.    Dispense:  20 capsule    Refill:  0    Order Specific Question:   Supervising Provider    Answer:   Eber Hong [[id number]]        Follow Up Instructions: I discussed the assessment and treatment plan with the patient. The patient was provided an opportunity to ask questions and all were answered. The patient agreed with the plan and demonstrated an understanding of the instructions.  A copy of instructions were sent to the patient via MyChart.  The patient was advised to call back or seek an in-person evaluation if the symptoms worsen or if the condition fails to improve as anticipated.  Time:  I spent 10 minutes with the patient via telehealth technology discussing the above problems/concerns.    Erica Daphine Deutscher, FNP

## 2021-03-04 ENCOUNTER — Other Ambulatory Visit: Payer: Self-pay | Admitting: *Deleted

## 2021-03-04 MED ORDER — METOPROLOL TARTRATE 25 MG PO TABS: 25.0000 mg | ORAL_TABLET | Freq: Two times a day (BID) | ORAL | 0 refills | Status: DC

## 2021-03-04 MED ORDER — CLOPIDOGREL BISULFATE 75 MG PO TABS: 75.0000 mg | ORAL_TABLET | Freq: Every day | ORAL | 0 refills | Status: DC

## 2021-04-01 ENCOUNTER — Ambulatory Visit: Payer: Medicare HMO | Admitting: Cardiovascular Disease

## 2021-04-29 ENCOUNTER — Other Ambulatory Visit: Payer: Self-pay

## 2021-04-29 MED ORDER — CLOPIDOGREL BISULFATE 75 MG PO TABS: 75.0000 mg | ORAL_TABLET | Freq: Every day | ORAL | 3 refills | Status: DC

## 2021-05-28 NOTE — Progress Notes (Signed)
Follow-up Outpatient Visit Date: 05/29/2021  Primary Care Provider: Leim Fabry, MD [address] [place] [address]  Chief Complaint: Fatigue  HPI:  Erica Brock is a 70 y.o. female with history of coronary artery disease status post CABG (11/2020; LIMA-LAD, SVG-D1, SVG-OM1, and SVG-PDA), hypertension, hyperlipidemia, type 2 diabetes mellitus, obstructive of sleep apnea, obesity, provoked DVT/PE, rheumatoid arthritis, anxiety, and cystic mastopathy, who presents for follow-up of coronary artery disease.  She previously followed in our office with Dr. Mariah Milling and was seen most recently in 02/2021 by Marisue Ivan, PA.  At that time, she complained of low energy, worse when her blood pressure was low.  She was advised to wean off clonidine.  Today, Erica Brock reports that she still does not have much energy, similar to her last visit.  She had blood work earlier this week through her eat PCP and was noted to have iron deficiency anemia.  She denies chest pain, shortness of breath, palpitations, and lightheadedness.  She tries to walk some but does not do more than the 200 feet going to her mailbox and back.  If she stands for more than 10 to 15 minutes at a time, she feels like her legs become weak and that she is "walking on a sponge."  She notes intermittent swelling in her arms and feet and is currently using as needed furosemide 1-2 times per week.  She is in the process of weaning off pantoprazole, as she has not had any more "heartburn" following her CABG in May.  She did not participate in cardiac rehab, as traveling to [facility] from [place] was not convenient for her.  Home blood pressure readings measured at least once a day are in the normal range (105-137/61-87).  Heart rates are typically in the upper 50s and 60s.  --------------------------------------------------------------------------------------------------  Past Medical History:  Diagnosis Date   Anxiety    Arthritis     Diabetes mellitus without complication (HCC)    Diffuse cystic mastopathy 2012   GERD (gastroesophageal reflux disease)    Heart murmur    ETT myoview 12/09 (Dr. Juel Burrow) no evidence of ischemia, good exercise tolerance - 9 min. echo (11/10): EF 60-65%, nml RV, PASP 25, nml valves   Hepatitis    as a child    HLD (hyperlipidemia)    HTN (hypertension)    for < 10 years   Obesity    OSA (obstructive sleep apnea)    mild; uses CPAP   Rheumatic fever    as a child   Past Surgical History:  Procedure Laterality Date   arthroscopic surgery     L elbow    BREAST BIOPSY Left    needle bx-neg   CARDIAC CATHETERIZATION     carpael tunnel release     R hand   COLONOSCOPY  2008   Dr. Sharen Hint   CORONARY ARTERY BYPASS GRAFT N/A 12/13/2020   Procedure: CORONARY ARTERY BYPASS GRAFTING (CABG) TIMES FOUR USING RIGHT GREATER SAPHENOUS VEIN HARVESTED ENDOSCOPICALLY AND LEFT INTERNAL MAMMARY ARTERY.;  Surgeon: Corliss Skains, MD;  Location: [facility] OR;  Service: Open Heart Surgery;  Laterality: N/A;   FOOT SURGERY Left    LEFT HEART CATH AND CORONARY ANGIOGRAPHY N/A 12/11/2020   Procedure: LEFT HEART CATH AND CORONARY ANGIOGRAPHY;  Surgeon: [name], MD;  Location: [facility] INVASIVE CV LAB;  Service: Cardiovascular;  Laterality: N/A;   reconstruction foot and bunion surgery - both feet     TEE WITHOUT CARDIOVERSION N/A 12/13/2020  Procedure: TRANSESOPHAGEAL ECHOCARDIOGRAM (TEE);  Surgeon: Lajuana Matte, MD;  Location: [facility];  Service: Open Heart Surgery;  Laterality: N/A;   TONSILLECTOMY     TOTAL ABDOMINAL HYSTERECTOMY  1994    Current Meds  Medication Sig   aspirin 81 MG tablet Take 1 tablet (81 mg total) by mouth daily.   benzonatate (TESSALON PERLES) 100 MG capsule Take 1 capsule (100 mg total) by mouth 3 (three) times daily as needed.   clopidogrel (PLAVIX) 75 MG tablet Take 1 tablet (75 mg total) by mouth daily.   colchicine 0.6 MG tablet Take 0.6 mg by mouth 2 (two) times  daily as needed.   ezetimibe (ZETIA) 10 MG tablet TAKE 1 TABLET DAILY   furosemide (LASIX) 20 MG tablet Take 1 tablet (20 mg total) by mouth daily as needed (weight gain 3 lbs overnight or 5 lbs in one week).   hydroxychloroquine (PLAQUENIL) 200 MG tablet Take 200 mg by mouth 2 (two) times daily.   lisinopril (ZESTRIL) 10 MG tablet Take 1 tablet (10 mg total) by mouth daily.   metFORMIN (GLUCOPHAGE-XR) 500 MG 24 hr tablet Take 500 mg by mouth at bedtime.   metoprolol tartrate (LOPRESSOR) 25 MG tablet Take 1 tablet (25 mg total) by mouth 2 (two) times daily.   montelukast (SINGULAIR) 10 MG tablet Take 10 mg by mouth daily as needed.   pantoprazole (PROTONIX) 40 MG tablet Alternate 20 mg and 40 mg doses by mouth every other day   potassium chloride SA (KLOR-CON) 10 MEQ tablet Take 1 tablet (10 mEq total) by mouth daily as needed (take only when you take Lasix).   rosuvastatin (CRESTOR) 40 MG tablet Take 1 tablet (40 mg total) by mouth daily.    Allergies: Prednisone and Atorvastatin  Social History   Tobacco Use   Smoking status: Never   Smokeless tobacco: Never   Tobacco comments:    tobacco use - no  Vaping Use   Vaping Use: Never used  Substance Use Topics   Alcohol use: Yes    Comment: social    Drug use: No    Family History  Problem Relation Age of Onset   Multiple sclerosis Mother    Hyperlipidemia Mother    Diabetes Father    Hypertension Father    Other Father        PPM   Heart attack Sister        LATE 27'S, 76   Hypertension Sister    Hyperlipidemia Sister    Aneurysm Brother        brain   Breast cancer Neg Hx     Review of Systems: A 12-system review of systems was performed and was negative except as noted in the HPI.  --------------------------------------------------------------------------------------------------  Physical Exam: BP (!) 160/90 (BP Location: Left Arm, Patient Position: Sitting, Cuff Size: Large)   Pulse 63   Ht 5\' 4"  (1.626 m)    Wt 185 lb (83.9 kg)   SpO2 98%   BMI 31.76 kg/m   General:  NAD. Neck: No JVD or HJR. Lungs: Clear to auscultation bilaterally without wheezes or crackles. Heart: Regular rate and rhythm without murmurs, rubs, or gallops. Abdomen: Soft, nontender, nondistended. Extremities: Trace pretibial edema.  EKG: EKG demonstrates normal sinus rhythm with left axis deviation.  Lateral T wave inversions seen on prior tracing from 01/01/2021 are no longer present.  Lab Results  Component Value Date   WBC 7.2 01/01/2021   HGB 11.7 01/01/2021   HCT  35.7 01/01/2021   MCV 91 01/01/2021   PLT 438 01/01/2021    Lab Results  Component Value Date   NA 135 01/24/2021   K 4.4 01/24/2021   CL 100 01/24/2021   CO2 24 01/24/2021   BUN 14 01/24/2021   CREATININE 0.64 01/24/2021   GLUCOSE 101 (H) 01/24/2021   ALT 21 12/13/2020    Lab Results  Component Value Date   CHOL 118 12/12/2020   HDL 61 12/12/2020   LDLCALC 31 12/12/2020   TRIG 130 12/12/2020   CHOLHDL 1.9 12/12/2020    --------------------------------------------------------------------------------------------------  ASSESSMENT AND PLAN: Coronary artery disease without angina: No chest pain reported.  Notably, "heartburn" present prior to NSTEMI/CABG in May has completely resolved.  I wonder if this is her anginal equivalent.  I agree with weaning off of pantoprazole.  We will continue dual antiplatelet therapy with aspirin and clopidogrel for 12 months from the time of the patient's CABG, assuming she does not have worsening anemia or develop active bleeding.  Fatigue: Likely multifactorial.  Anemia could be playing a role, though her hemoglobin checked earlier this week was only modestly decreased at 10.9.  We have agreed to taper off metoprolol to see if that helps her fatigue.  I encouraged her to continue using her CPAP machine and follow-up with Dr. Astrid Divine regarding her iron deficiency anemia.  I have encouraged Erica Brock to  increase her activity, as deconditioning is likely playing a role in her symptoms.  Hypertension: Blood pressure has been well controlled at home but is moderately elevated today.  It was even higher at her recent visit with Dr. Emelia Loron.  We have agreed to wean her off metoprolol (decreased to 12.5 mg twice daily x3 days, then stop).  We will also increase lisinopril to 20 mg daily.  We will have her return for blood pressure check and BMP with an APP in 2-3 weeks.  Hyperlipidemia associated with type 2 diabetes mellitus: Lipid panel with Dr. Astrid Divine last week showed excellent control with an LDL of 41 and triglycerides of 86.  Continue with rosuvastatin and ezetimibe, though if fatigue persists, de-escalation of this regimen may also need to be considered.  Ongoing management of diabetes per Dr. Astrid Divine.  Follow-up: Return to clinic in 2-3 weeks.  Nelva Bush, MD 05/29/2021 9:21 AM

## 2021-05-29 ENCOUNTER — Ambulatory Visit: Payer: Medicare HMO | Admitting: Internal Medicine

## 2021-05-29 ENCOUNTER — Encounter: Payer: Self-pay | Admitting: Internal Medicine

## 2021-05-29 ENCOUNTER — Other Ambulatory Visit: Payer: Self-pay

## 2021-05-29 VITALS — BP 160/90 | HR 63 | Ht 64.0 in | Wt 185.0 lb

## 2021-05-29 DIAGNOSIS — E1169 Type 2 diabetes mellitus with other specified complication: Secondary | ICD-10-CM | POA: Diagnosis not present

## 2021-05-29 DIAGNOSIS — I2510 Atherosclerotic heart disease of native coronary artery without angina pectoris: Secondary | ICD-10-CM | POA: Diagnosis not present

## 2021-05-29 DIAGNOSIS — I10 Essential (primary) hypertension: Secondary | ICD-10-CM

## 2021-05-29 DIAGNOSIS — E785 Hyperlipidemia, unspecified: Secondary | ICD-10-CM

## 2021-05-29 DIAGNOSIS — R5383 Other fatigue: Secondary | ICD-10-CM

## 2021-05-29 MED ORDER — LISINOPRIL 20 MG PO TABS: 20.0000 mg | ORAL_TABLET | Freq: Every day | ORAL | 3 refills | Status: DC

## 2021-05-29 NOTE — Patient Instructions (Addendum)
Medication Instructions:   Your physician has recommended you make the following change in your medication:   INCREASE Lisinopril 20 mg daily   WEAN OFF Metoprolol Tartrate 25 mg - TAKE HALF tablet TWICE daily x 3 days then STOP  *If you need a refill on your cardiac medications before your next appointment, please call your pharmacy*   Lab Work:  None ordered  Testing/Procedures:  None ordered   Follow-Up: At [facility], you and your health needs are our priority.  As part of our continuing mission to provide you with exceptional heart care, we have created designated Provider Care Teams.  These Care Teams include your primary Cardiologist (physician) and Advanced Practice Providers (APPs -  Physician Assistants and Nurse Practitioners) who all work together to provide you with the care you need, when you need it.  We recommend signing up for the patient portal called "MyChart".  Sign up information is provided on this After Visit Summary.  MyChart is used to connect with patients for Virtual Visits (Telemedicine).  Patients are able to view lab/test results, encounter notes, upcoming appointments, etc.  Non-urgent messages can be sent to your provider as well.   To learn more about what you can do with MyChart, go to [email or web address].    Your next appointment:   2 - 3  week(s) w/ APP  The format for your next appointment:   In Person  Provider:   You will see one of the following Advanced Practice Providers on your designated Care Team:   Nicolasa Ducking, NP Eula Listen, PA-C Cadence Fransico Michael, [place]

## 2021-06-21 ENCOUNTER — Encounter: Payer: Self-pay | Admitting: Medical

## 2021-06-21 ENCOUNTER — Other Ambulatory Visit: Payer: Self-pay

## 2021-06-21 ENCOUNTER — Ambulatory Visit: Payer: Medicare HMO | Admitting: Medical

## 2021-06-21 VITALS — BP 162/92 | HR 83 | Ht 64.0 in | Wt 186.8 lb

## 2021-06-21 DIAGNOSIS — E782 Mixed hyperlipidemia: Secondary | ICD-10-CM | POA: Diagnosis not present

## 2021-06-21 DIAGNOSIS — I10 Essential (primary) hypertension: Secondary | ICD-10-CM

## 2021-06-21 DIAGNOSIS — I2510 Atherosclerotic heart disease of native coronary artery without angina pectoris: Secondary | ICD-10-CM

## 2021-06-21 DIAGNOSIS — R5383 Other fatigue: Secondary | ICD-10-CM

## 2021-06-21 NOTE — Progress Notes (Signed)
Cardiology Office Note:    Date:  06/21/2021   ID:  Erica Brock, DOB Jul 25, 1950, MRN [id number]  PCP:  Gayland Curry, MD  [facility] Cardiologist:  Nelva Bush, MD  [facility] Electrophysiologist:  None   Referring MD: Gayland Curry, MD   Chief Complaint: 2-3 week follow-up  History of Present Illness:    Erica Brock is a 70 y.o. female with a hx of coronary artery disease status post CABG (11/2020; LIMA-LAD, SVG-D1, SVG-OM1, and SVG-PDA), hypertension, hyperlipidemia, type 2 diabetes mellitus, obstructive of sleep apnea, obesity, provoked DVT/PE, rheumatoid arthritis, anxiety, and cystic mastopathy, who presents for follow-up of coronary artery disease.   CAC Score 2407 in 2018 with diffuse LM and 3v CAC. 2018 MPI ruled low risk though moderate in size defect noted as below and thought to be 2/2 artifact and less likely ischemia.     Echo 09/19/20 showed nl EF and mild MR.   Presented to [facility] 11/2020. high-sensitivity troponin peaked at 329.  EKG NSR with LAFB and incomplete RBBB at 84 bpm and no significant ST/T changes. Given RF for CAD, recommendation was for MPI that was ruled high risk with mid anteroseptal and apical septal defect suggestive of scar and reversible basal and mid anterolateral / inferolateral defect consistent with ischemia. CP reported after regadenoson. Subsequent LHC 5/24 showed heavily calcified coronary arteries with significant 3v disease as outlined below under CV studies. Recommendation was for transfer to [facility] for evaluation by CTS.    She was seen by CTS with CABG x4 12/13/20 by Dr. Kipp Brood using R SVG harvest (LIMA to LAD,rev SVG to PDA, rev SVG to OM1, rev SVG to [place]).  Echo 02/20/2021 showed EF 50 to 55%, NR WMA, low normal RVSF mild MR, mild LAE, mild RVE, mild aortic sclerosis without stenosis, RAP 3 mmHg.  Seen 05/29/21 for fatigue noted to have iron deficiency anemia. Also planning on taper off Lopressor and pantoprazole.  Lisinopril increased to 20mg  daily.   Today, the patient reports she is off the Lopressor. She is on Protonix every other day, planning on stopping this. She is on lisinopril 20mg  daily. She noticed her stress makes he BP higher. She is taking iron, feels overall energy is better. No chest pain, SOB. She has dependent swelling. No orthopnea, pnd. BP high in the office, had her medications today, says it's always high in the office. PCP is monitoring Hgb.    Past Medical History:  Diagnosis Date   Anxiety    Arthritis    Diabetes mellitus without complication ([place])    Diffuse cystic mastopathy 2012   GERD (gastroesophageal reflux disease)    Heart murmur    ETT myoview 12/09 (Dr. Lavera Guise) no evidence of ischemia, good exercise tolerance - 9 min. echo (11/10): EF 60-65%, nml RV, PASP 25, nml valves   Hepatitis    as a child    HLD (hyperlipidemia)    HTN (hypertension)    for < 10 years   Obesity    OSA (obstructive sleep apnea)    mild; uses CPAP   Rheumatic fever    as a child    Past Surgical History:  Procedure Laterality Date   arthroscopic surgery     L elbow    BREAST BIOPSY Left    needle bx-neg   CARDIAC CATHETERIZATION     carpael tunnel release     R hand   COLONOSCOPY  2008   Dr. Epifanio Lesches   CORONARY ARTERY  BYPASS GRAFT N/A 12/13/2020   Procedure: CORONARY ARTERY BYPASS GRAFTING (CABG) TIMES FOUR USING RIGHT GREATER SAPHENOUS VEIN HARVESTED ENDOSCOPICALLY AND LEFT INTERNAL MAMMARY ARTERY.;  Surgeon: Lajuana Matte, MD;  Location: [place];  Service: Open Heart Surgery;  Laterality: N/A;   FOOT SURGERY Left    LEFT HEART CATH AND CORONARY ANGIOGRAPHY N/A 12/11/2020   Procedure: LEFT HEART CATH AND CORONARY ANGIOGRAPHY;  Surgeon: Nelva Bush, MD;  Location: [place] [facility];  Service: Cardiovascular;  Laterality: N/A;   reconstruction foot and bunion surgery - both feet     TEE WITHOUT CARDIOVERSION N/A 12/13/2020   Procedure: TRANSESOPHAGEAL ECHOCARDIOGRAM  (TEE);  Surgeon: Lajuana Matte, MD;  Location: [place];  Service: Open Heart Surgery;  Laterality: N/A;   TONSILLECTOMY     TOTAL ABDOMINAL HYSTERECTOMY  1994    Current Medications: Current Meds  Medication Sig   aspirin 81 MG tablet Take 1 tablet (81 mg total) by mouth daily.   benzonatate (TESSALON PERLES) 100 MG capsule Take 1 capsule (100 mg total) by mouth 3 (three) times daily as needed.   clopidogrel (PLAVIX) 75 MG tablet Take 1 tablet (75 mg total) by mouth daily.   colchicine 0.6 MG tablet Take 0.6 mg by mouth 2 (two) times daily as needed.   ezetimibe (ZETIA) 10 MG tablet TAKE 1 TABLET DAILY   furosemide (LASIX) 20 MG tablet Take 1 tablet (20 mg total) by mouth daily as needed (weight gain 3 lbs overnight or 5 lbs in one week).   hydroxychloroquine (PLAQUENIL) 200 MG tablet Take 200 mg by mouth 2 (two) times daily.   lisinopril (ZESTRIL) 20 MG tablet Take 1 tablet (20 mg total) by mouth daily.   metFORMIN (GLUCOPHAGE-XR) 500 MG 24 hr tablet Take 500 mg by mouth at bedtime.   montelukast (SINGULAIR) 10 MG tablet Take 10 mg by mouth daily as needed.   pantoprazole (PROTONIX) 40 MG tablet Alternate 20 mg and 40 mg doses by mouth every other day   potassium chloride SA (KLOR-CON) 10 MEQ tablet Take 1 tablet (10 mEq total) by mouth daily as needed (take only when you take Lasix).   rosuvastatin (CRESTOR) 40 MG tablet Take 1 tablet (40 mg total) by mouth daily.     Allergies:   Prednisone and Atorvastatin   Social History   Socioeconomic History   Marital status: Married    Spouse name: Not on file   Number of children: Not on file   Years of education: Not on file   Highest education level: Not on file  Occupational History   Not on file  Tobacco Use   Smoking status: Never   Smokeless tobacco: Never   Tobacco comments:    tobacco use - no  Vaping Use   Vaping Use: Never used  Substance and Sexual Activity   Alcohol use: Yes    Comment: social    Drug use: No    Sexual activity: Not on file  Other Topics Concern   Not on file  Social History Narrative   Married; full time; does not get regular exercise.    Social Determinants of Health   Financial Resource Strain: Not on file  Food Insecurity: Not on file  Transportation Needs: Not on file  Physical Activity: Not on file  Stress: Not on file  Social Connections: Not on file     Family History: The patient's family history includes Aneurysm in her brother; Diabetes in her father; Heart attack in  her sister; Hyperlipidemia in her mother and sister; Hypertension in her father and sister; Multiple sclerosis in her mother; Other in her father. There is no history of Breast cancer.  ROS:   Please see the history of present illness.     All other systems reviewed and are negative.  EKGs/Labs/Other Studies Reviewed:    The following studies were reviewed today:  Echo 02/20/21  1. Left ventricular ejection fraction, by estimation, is 50 to 55%. The  left ventricle has low normal function. The left ventricle has no regional  wall motion abnormalities. Left ventricular diastolic parameters are  indeterminate.   2. Right ventricular systolic function is low normal. The right  ventricular size is mildly enlarged.   3. Left atrial size was mildly dilated.   4. The mitral valve is grossly normal. Mild mitral valve regurgitation.   5. The aortic valve is tricuspid. Aortic valve regurgitation is not  visualized. Mild aortic valve sclerosis is present, with no evidence of  aortic valve stenosis.   6. The inferior vena cava is normal in size with greater than 50%  respiratory variability, suggesting right atrial pressure of 3 mmHg.   LHC 11/2020 Conclusions: Severe three-vessel coronary artery disease with heavy calcification, including 40% mid LMCA stenosis, sequential 40% mid and 70-80% mid/distal LAD lesions, 50% proximal LCx disease at takeoff of large OM branch followed by sequential 70% and  99% stenoses in OM1, and chronic total occlusion of dominant RCA with left to right and right to right collaterals. Mildly elevated left ventricular filling pressure.   Recommendations: Images reviewed with Drs. Gollan and Arida.  Given three-vessel coronary artery disease including subtotal/total occlusions of OM1 and RCA, heavy calcification throughout the coronary arteries, and history of diabetes mellitus, transfer to [facility] for surgical consultation for CABG is recommended. Start IV heparin 2 hours after TR band removal. Obtain echocardiogram. Aggressive secondary prevention.   [name], MD [facility]    EKG:  EKG is not ordered today.   Recent Labs: 09/04/2020: TSH 0.962 12/13/2020: ALT 21 12/14/2020: Magnesium 2.5 01/01/2021: BNP 155.0; Hemoglobin 11.7; Platelets 438 01/24/2021: BUN 14; Creatinine, Ser 0.64; Potassium 4.4; Sodium 135  Recent Lipid Panel    Component Value Date/Time   CHOL 118 12/12/2020 0947   CHOL 158 11/15/2015 0843   TRIG 130 12/12/2020 0947   TRIG 243 11/09/2008 0000   HDL 61 12/12/2020 0947   HDL 69 11/15/2015 0843   CHOLHDL 1.9 12/12/2020 0947   VLDL 26 12/12/2020 0947   LDLCALC 31 12/12/2020 0947   LDLCALC 58 11/15/2015 0843    Physical Exam:    VS:  BP (!) 162/92   Pulse 83   Ht 5\' 4"  (1.626 m)   Wt 186 lb 12.8 oz (84.7 kg)   SpO2 99%   BMI 32.06 kg/m     Wt Readings from Last 3 Encounters:  06/21/21 186 lb 12.8 oz (84.7 kg)  05/29/21 185 lb (83.9 kg)  02/22/21 185 lb (83.9 kg)     GEN:  Well nourished, well developed in no acute distress HEENT: Normal NECK: No JVD; No carotid bruits LYMPHATICS: No lymphadenopathy CARDIAC: RRR, no murmurs, rubs, gallops RESPIRATORY:  Clear to auscultation without rales, wheezing or rhonchi  ABDOMEN: Soft, non-tender, non-distended MUSCULOSKELETAL:  No edema; No deformity  SKIN: Warm and dry NEUROLOGIC:  Alert and oriented x 3 PSYCHIATRIC:  Normal affect   ASSESSMENT:    1.  Coronary artery disease involving native coronary artery of native  heart without angina pectoris   2. Hyperlipidemia, mixed   3. Essential (primary) hypertension   4. Fatigue, unspecified type    PLAN:    In order of problems listed above:  CAD without angina Patient denies anginal symptoms. Continue DAPT with plavix and Aspirin for 12 months form the time of CABG. No worsening anemia by recent labs. Continue statin and Zetia.   Fatigue This has improved since the last visit.. She is now off the Lopressor and almost off the Protonix. She is on iron pills as well, this is being followed by PCP. No further work-up.  HTN Patient has tapered off the Lopressor she is on Lisinopril 20mg  daily. BP log at home shows 110-120/60-70s. High today, but always high in the office.   HLD LDL 31 11/2020. Continue Crestor and Zetia.   Disposition: Follow up in 3 month(s) with MD/APP    Signed, Rajanae Mantia Ninfa Meeker, PA-C  06/21/2021 2:11 PM    [facility]

## 2021-06-21 NOTE — Patient Instructions (Addendum)
Medication Instructions:  ° °Your physician recommends that you continue on your current medications as directed. Please refer to the Current Medication list given to you today.  ° °*If you need a refill on your cardiac medications before your next appointment, please call your pharmacy* ° ° °Lab Work: °None ordered ° °If you have labs (blood work) drawn today and your tests are completely normal, you will receive your results only by: °MyChart Message (if you have MyChart) OR °A paper copy in the mail °If you have any lab test that is abnormal or we need to change your treatment, we will call you to review the results. ° ° °Testing/Procedures: °None ordered ° ° °Follow-Up: °At [facility], you and your health needs are our priority.  As part of our continuing mission to provide you with exceptional heart care, we have created designated Provider Care Teams.  These Care Teams include your primary Cardiologist (physician) and Advanced Practice Providers (APPs -  Physician Assistants and Nurse Practitioners) who all work together to provide you with the care you need, when you need it. ° °We recommend signing up for the patient portal called "MyChart".  Sign up information is provided on this After Visit Summary.  MyChart is used to connect with patients for Virtual Visits (Telemedicine).  Patients are able to view lab/test results, encounter notes, upcoming appointments, etc.  Non-urgent messages can be sent to your provider as well.   °To learn more about what you can do with MyChart, go to [email or web address].   ° °Your next appointment:   °3 month(s) ° °The format for your next appointment:   °In Person ° °Provider:   °You may see Christopher End, MD or one of the following Advanced Practice Providers on your designated Care Team:   °Christopher Berge, NP °Ryan Dunn, PA-C °Cadence Furth, PA-C ° ° °Other Instructions °N/A °

## 2021-06-23 ENCOUNTER — Other Ambulatory Visit: Payer: Self-pay | Admitting: Cardiovascular Disease

## 2021-07-16 ENCOUNTER — Telehealth: Payer: Self-pay | Admitting: Internal Medicine

## 2021-07-16 NOTE — Telephone Encounter (Signed)
I agree with plan outlined below.  If she is able to capture a rhythm strip using her smart watch, she should forward it to [place] through MyChart for review.  Otherwise, we will discuss placement of event monitor when she follows up with [place] next month.  [name], MD [facility]

## 2021-07-16 NOTE — Telephone Encounter (Signed)
Called and spoke with pt.  Friday morning 12/23 at 2 AM pt had pressure in chest that woke her up.  The next day 12/24 pt woke up again very early morning and "it felt like my heart was fluttering." Fluttering lasted ~15 min initially then pt felt fluttering "off and on until I later got out of bed that same morning."  Pt states that her smart watch confirmed she was in a fib on 12/24.  Pt became concerned with fluttering, and denies recurrent chest "pain", denies shortness of breath.  Pt restarted Metoprolol Tartrate 25 mg twice daily on her own after continued fluttering in chest.  Pt reports 3-4 hours after starting Metoprolol, all symptoms subsided completely.   Pt recently weaned off Metoprolol Tartrate 25 mg BID at office visit 05/29/21.  Lisinopril also incr from 10 to 20 mg since stopping Metoprolol.  Pt does not have h/o afib and states this was first episode of these symptoms.   Pt states her BP/HR have been "running normal" Reports this morning BP 113/74 and HR is "usually in the 60s, last night it was 57." But this morning HR was 48.   Advised pt that I will schedule a follow up to be seen in office d/t new onset symptoms, possible a fib.  Appointment scheduled with Dr. Okey Dupre 08/01/21 at 1:40 PM. Pt confirmed.  Advised that she continue to monitor s/s and BP/HR and that generally she should not take metoprolol if HR <60.  Notified pt I will discuss with Dr. Okey Dupre to see what he advises further.

## 2021-07-16 NOTE — Telephone Encounter (Signed)
Patient c/o Palpitations:  High priority if patient c/o lightheadedness, shortness of breath, or chest pain  How long have you had palpitations/irregular HR/ Afib? Are you having the symptoms now? Only on Christmas Eve - on and off for about 45 minutes   Are you currently experiencing lightheadedness, SOB or CP? Chest pressure woke her up   Do you have a history of afib (atrial fibrillation) or irregular heart rhythm? no  Have you checked your BP or HR? (document readings if available): BP was ~120s, can't remember exact number but was normal   Are you experiencing any other symptoms? No, patient started taking metoprolol and it eased off.  patient would like to know if she should schedule to be seen sooner or continue this medication until appt in march

## 2021-07-17 ENCOUNTER — Ambulatory Visit (INDEPENDENT_AMBULATORY_CARE_PROVIDER_SITE_OTHER): Payer: Medicare HMO

## 2021-07-17 ENCOUNTER — Encounter: Payer: Self-pay | Admitting: Internal Medicine

## 2021-07-17 DIAGNOSIS — R002 Palpitations: Secondary | ICD-10-CM

## 2021-07-17 DIAGNOSIS — I499 Cardiac arrhythmia, unspecified: Secondary | ICD-10-CM

## 2021-07-17 NOTE — Telephone Encounter (Signed)
I recommend continuing current medications including lisinopril and metoprolol.  [name], MD [facility]

## 2021-07-17 NOTE — Telephone Encounter (Signed)
Pt made aware she will continue current medications including lisinopril and metoprolol.

## 2021-07-17 NOTE — Telephone Encounter (Signed)
I have reviewed Erica Brock's Apple Watch tracings, which show some irregularity, though multiple beats appear to be sinus in origin.  This argues against atrial fibrillation and more likely represents frequent PAC's and/or short runs of atrial tachycardia.  I recommend that we obtain a BMP, Mg, and TSH at Erica Brock's earliest convenience and also have her wear a Zio XT for 2 weeks to better characterize potential arrhythmias.  Her follow-up visit with me can be pushed back a couple of weeks so that we have the results of the monitor available at the time of her visit.  She should let us know if any questions or concerns arise in the meantime.  [name], MD [facility]

## 2021-07-17 NOTE — Telephone Encounter (Signed)
Called and spoke to pt.  Notified of Dr. Serita Kyle recc below.  Pt voiced understanding.  Pt scheduled for lab work (BMP, Mg, TSH) tomorrow 07/18/21 in our office.  Zio XT ordered to be mailed to pt. Pt to wear for 2 weeks.  Reviewed instructions over the phone with pt for Zio placement and management.  Advised pt call with any questions.  Pt's follow up has been rescheduled to 08/22/21.   Pt does ask if she should continue Lisinopril 20 mg QD and Metoprolol 25 mg BID since the Metoprolol did improve symptoms.  Told pt I will clarify with Dr. Okey Dupre regarding medications moving forward.

## 2021-07-17 NOTE — Telephone Encounter (Signed)
Spoke to pt and notified of Dr. Serita Kyle recc.  Pt voiced understanding. She will try to upload a rhythm strip and will send to MyChart if able.  Pt will let us know of any changes prior to visit.  No further questions or concerns at this time.  Pt will follow up 08/01/21 as scheduled.

## 2021-07-18 ENCOUNTER — Other Ambulatory Visit: Payer: Self-pay

## 2021-07-18 ENCOUNTER — Other Ambulatory Visit (INDEPENDENT_AMBULATORY_CARE_PROVIDER_SITE_OTHER): Payer: Medicare HMO

## 2021-07-18 DIAGNOSIS — I2510 Atherosclerotic heart disease of native coronary artery without angina pectoris: Secondary | ICD-10-CM

## 2021-07-18 DIAGNOSIS — R002 Palpitations: Secondary | ICD-10-CM

## 2021-07-18 DIAGNOSIS — I499 Cardiac arrhythmia, unspecified: Secondary | ICD-10-CM

## 2021-07-18 DIAGNOSIS — R5383 Other fatigue: Secondary | ICD-10-CM

## 2021-07-19 ENCOUNTER — Telehealth: Payer: Self-pay

## 2021-07-19 DIAGNOSIS — I10 Essential (primary) hypertension: Secondary | ICD-10-CM

## 2021-07-19 LAB — BASIC METABOLIC PANEL
BUN/Creatinine Ratio: 14 (ref 12–28)
BUN: 9 mg/dL (ref 8–27)
CO2: 25 mmol/L (ref 20–29)
Calcium: 9.7 mg/dL (ref 8.7–10.3)
Chloride: 102 mmol/L (ref 96–106)
Creatinine, Ser: 0.64 mg/dL (ref 0.57–1.00)
Glucose: 83 mg/dL (ref 70–99)
Potassium: 5.5 mmol/L — ABNORMAL HIGH (ref 3.5–5.2)
Sodium: 139 mmol/L (ref 134–144)
eGFR: 94 mL/min/{1.73_m2} (ref >59)

## 2021-07-19 LAB — MAGNESIUM: Magnesium: 2.3 mg/dL (ref 1.6–2.3)

## 2021-07-19 LAB — TSH: TSH: 1.13 u[IU]/mL (ref 0.450–4.500)

## 2021-07-19 NOTE — Telephone Encounter (Signed)
-----   Message from [name], MD sent at 07/19/2021  4:34 PM EST ----- Potassium noted to be mildly elevated; question if there is some degree of hemolysis.  Electrolytes and thyroid function otherwise normal.  Recommend that Erica Brock not take any supplemental potassium (currently ordered as as needed with furosemide) and have a repeat BMP next week.  She should also try to limit her dietary potassium intake.

## 2021-07-19 NOTE — Telephone Encounter (Signed)
Called patient and gave her the previous documented result note from Dr. Okey Dupre. Patient has been scheduled for a lab draw next week on 07/25/20. Reviewed foods that were high in Potassium with patient she stated that she does usually eat a banana every morning. She will stop that over the next week.   Patient verbalized understanding and agreed with plan.

## 2021-07-20 DIAGNOSIS — I499 Cardiac arrhythmia, unspecified: Secondary | ICD-10-CM

## 2021-07-20 DIAGNOSIS — R002 Palpitations: Secondary | ICD-10-CM

## 2021-07-25 ENCOUNTER — Other Ambulatory Visit: Payer: Self-pay

## 2021-07-25 ENCOUNTER — Other Ambulatory Visit (INDEPENDENT_AMBULATORY_CARE_PROVIDER_SITE_OTHER): Payer: Medicare HMO

## 2021-07-25 DIAGNOSIS — I10 Essential (primary) hypertension: Secondary | ICD-10-CM

## 2021-07-27 LAB — BASIC METABOLIC PANEL
BUN/Creatinine Ratio: 14 (ref 12–28)
BUN: 11 mg/dL (ref 8–27)
CO2: 23 mmol/L (ref 20–29)
Calcium: 9.4 mg/dL (ref 8.7–10.3)
Chloride: 101 mmol/L (ref 96–106)
Creatinine, Ser: 0.81 mg/dL (ref 0.57–1.00)
Glucose: 106 mg/dL — ABNORMAL HIGH (ref 70–99)
Potassium: 5.3 mmol/L — ABNORMAL HIGH (ref 3.5–5.2)
Sodium: 137 mmol/L (ref 134–144)
eGFR: 78 mL/min/{1.73_m2} (ref >59)

## 2021-08-01 ENCOUNTER — Ambulatory Visit: Payer: Medicare HMO | Admitting: Internal Medicine

## 2021-08-18 NOTE — Progress Notes (Signed)
Follow-up Outpatient Visit Date: 08/22/2021  Primary Care Provider: Gayland Curry, MD [place] [address]  Chief Complaint: Follow-up palpitations and coronary artery disease  HPI:  Erica Brock is a 71 y.o. female with history of coronary artery disease status post CABG (11/2020; LIMA-LAD, SVG-D1, SVG-OM1, and SVG-PDA), PSVT, hypertension, hyperlipidemia, type 2 diabetes mellitus, obstructive of sleep apnea, obesity, provoked DVT/PE, rheumatoid arthritis, anxiety, and cystic mastopathy, who presents for follow-up of chest pain and palpitations.  I last saw her in 05/2021, at which time she complained of fatigue in the setting of recently diagnosed iron deficiency.  We decided to wean her metoprolol and increase lisinopril for BP control.  At her follow-up visit in 06/2021 with Cadence Furth, PA, Erica Brock reported some improvement in her energy with weaning of metoprolol and initiation of iron supplementation.  She reached out to [place] a few weeks later complaining of palpitations.  She was concerned about possible a-fib on her Apple Watch.  Tracings showed some irregularity but were not definitive for a-fib.  We agreed to perform ambulatory cardiac monitoring for 14 days, which showed several episodes of PSVT but no a-fib/flutter.  She is back on metoprolol and reports absence of further palpitations.  However, she is concerned that metoprolol may be contributing to recent diarrhea.  Erica Brock otherwise feels well other than sporadic "twitches" of pain in the chest.  The pain occurs randomly and only lasts an instant.  She denies shortness of breath, lightheadedness, and edema.  Home BP readings over the last month have been normal, ranging 102-132/59-84.  She attributes today's high reading to white coat hypertension.  --------------------------------------------------------------------------------------------------  Past Medical History:  Diagnosis Date   Anxiety    Arthritis     Diabetes mellitus without complication ([place])    Diffuse cystic mastopathy 2012   GERD (gastroesophageal reflux disease)    Heart murmur    ETT myoview 12/09 (Dr. Lavera Guise) no evidence of ischemia, good exercise tolerance - 9 min. echo (11/10): EF 60-65%, nml RV, PASP 25, nml valves   Hepatitis    as a child    HLD (hyperlipidemia)    HTN (hypertension)    for < 10 years   Obesity    OSA (obstructive sleep apnea)    mild; uses CPAP   Rheumatic fever    as a child   Past Surgical History:  Procedure Laterality Date   arthroscopic surgery     L elbow    BREAST BIOPSY Left    needle bx-neg   CARDIAC CATHETERIZATION     carpael tunnel release     R hand   COLONOSCOPY  2008   Dr. Epifanio Lesches   CORONARY ARTERY BYPASS GRAFT N/A 12/13/2020   Procedure: CORONARY ARTERY BYPASS GRAFTING (CABG) TIMES FOUR USING RIGHT GREATER SAPHENOUS VEIN HARVESTED ENDOSCOPICALLY AND LEFT INTERNAL MAMMARY ARTERY.;  Surgeon: Lajuana Matte, MD;  Location: [facility];  Service: Open Heart Surgery;  Laterality: N/A;   FOOT SURGERY Left    LEFT HEART CATH AND CORONARY ANGIOGRAPHY N/A 12/11/2020   Procedure: LEFT HEART CATH AND CORONARY ANGIOGRAPHY;  Surgeon: Nelva Bush, MD;  Location: [facility];  Service: Cardiovascular;  Laterality: N/A;   reconstruction foot and bunion surgery - both feet     TEE WITHOUT CARDIOVERSION N/A 12/13/2020   Procedure: TRANSESOPHAGEAL ECHOCARDIOGRAM (TEE);  Surgeon: Lajuana Matte, MD;  Location: [facility];  Service: Open Heart Surgery;  Laterality: N/A;   TONSILLECTOMY  TOTAL ABDOMINAL HYSTERECTOMY  1994    Current Meds  Medication Sig   aspirin 81 MG tablet Take 1 tablet (81 mg total) by mouth daily.   benzonatate (TESSALON PERLES) 100 MG capsule Take 1 capsule (100 mg total) by mouth 3 (three) times daily as needed.   clopidogrel (PLAVIX) 75 MG tablet Take 1 tablet (75 mg total) by mouth daily.   colchicine 0.6 MG tablet Take 0.6 mg by mouth 2 (two) times  daily as needed.   ezetimibe (ZETIA) 10 MG tablet TAKE 1 TABLET DAILY   furosemide (LASIX) 20 MG tablet Take 1 tablet (20 mg total) by mouth daily as needed (weight gain 3 lbs overnight or 5 lbs in one week).   hydroxychloroquine (PLAQUENIL) 200 MG tablet Take 200 mg by mouth 2 (two) times daily.   lisinopril (ZESTRIL) 20 MG tablet Take 1 tablet (20 mg total) by mouth daily.   metFORMIN (GLUCOPHAGE-XR) 500 MG 24 hr tablet Take 500 mg by mouth at bedtime.   METOPROLOL TARTRATE PO Take 25 mg by mouth 2 (two) times daily.   montelukast (SINGULAIR) 10 MG tablet Take 10 mg by mouth daily as needed.   pantoprazole (PROTONIX) 20 MG tablet Take 20 mg by mouth every other day.   potassium chloride SA (KLOR-CON) 10 MEQ tablet Take 1 tablet (10 mEq total) by mouth daily as needed (take only when you take Lasix).   rosuvastatin (CRESTOR) 40 MG tablet Take 1 tablet (40 mg total) by mouth daily.    Allergies: Prednisone and Atorvastatin  Social History   Tobacco Use   Smoking status: Never   Smokeless tobacco: Never   Tobacco comments:    tobacco use - no  Vaping Use   Vaping Use: Never used  Substance Use Topics   Alcohol use: Yes    Comment: social    Drug use: No    Family History  Problem Relation Age of Onset   Multiple sclerosis Mother    Hyperlipidemia Mother    Diabetes Father    Hypertension Father    Other Father        PPM   Heart attack Sister        LATE 34'S, 29   Hypertension Sister    Hyperlipidemia Sister    Aneurysm Brother        brain   Breast cancer Neg Hx     Review of Systems: A 12-system review of systems was performed and was negative except as noted in the HPI.  --------------------------------------------------------------------------------------------------  Physical Exam: BP (!) 192/110 (BP Location: Left Arm, Patient Position: Sitting, Cuff Size: Large)    Pulse 65    Ht 5\' 4"  (1.626 m)    Wt 186 lb (84.4 kg)    SpO2 97%    BMI 31.93 kg/m    General:  NAD. Neck: No JVD or HJR. Lungs: Clear to auscultation bilaterally without wheezes or crackles. Heart: Regular rate and rhythm without murmurs, rubs, or gallops. Abdomen: Soft, nontender, nondistended. Extremities: No lower extremity edema.  EKG:  Normal sinus rhythm with left axis deviation and incomplete RBBB.  No significant change from prior tracing on 05/29/2021.  Lab Results  Component Value Date   WBC 7.2 01/01/2021   HGB 11.7 01/01/2021   HCT 35.7 01/01/2021   MCV 91 01/01/2021   PLT 438 01/01/2021    Lab Results  Component Value Date   NA 137 07/25/2021   K 5.3 (H) 07/25/2021   CL 101 07/25/2021  CO2 23 07/25/2021   BUN 11 07/25/2021   CREATININE 0.81 07/25/2021   GLUCOSE 106 (H) 07/25/2021   ALT 21 12/13/2020    Lab Results  Component Value Date   CHOL 118 12/12/2020   HDL 61 12/12/2020   LDLCALC 31 12/12/2020   TRIG 130 12/12/2020   CHOLHDL 1.9 12/12/2020    --------------------------------------------------------------------------------------------------  ASSESSMENT AND PLAN: PSVT: No evidence of a-fib on recent 14-day event monitor.  Symptoms have improved with metoprolol, though Erica Brock is concerned that metoprolol may be contributing to diarrhea.  We have agreed to switch to carvedilol 6.25 mg BID to see if the diarrhea improves while continuing to treat her palpitations and hypertension.  Coronary artery disease: No angina reported.  Continue current medications for secondary prevention, including up to 12 months of DAPT from time of CABG in 11/2020.  Hyperlipidemia associated with type 2 diabetes mellitus: Lipids well-controlled on last check.  Continue rosuvastatin and ezetimibe for target LDL < 70.  Ongoing management of DM per PCP.  Hypertension and hyperkalemia: BP poorly controlled but typically normal at home.  We will try switching metoprolol to carvedilol.  Continue current dose of lisinopril.  We will recheck a BMP today,  given mild hyperkalemia noted last month with dose escalation.  Follow-up: Return to clinic in 3 months.  Nelva Bush, MD 08/22/2021 12:02 PM

## 2021-08-22 ENCOUNTER — Other Ambulatory Visit: Payer: Self-pay

## 2021-08-22 ENCOUNTER — Ambulatory Visit: Payer: Medicare HMO | Admitting: Internal Medicine

## 2021-08-22 ENCOUNTER — Encounter: Payer: Self-pay | Admitting: Internal Medicine

## 2021-08-22 VITALS — BP 192/110 | HR 65 | Ht 64.0 in | Wt 186.0 lb

## 2021-08-22 DIAGNOSIS — I25110 Atherosclerotic heart disease of native coronary artery with unstable angina pectoris: Secondary | ICD-10-CM

## 2021-08-22 DIAGNOSIS — I10 Essential (primary) hypertension: Secondary | ICD-10-CM

## 2021-08-22 DIAGNOSIS — I2510 Atherosclerotic heart disease of native coronary artery without angina pectoris: Secondary | ICD-10-CM | POA: Diagnosis not present

## 2021-08-22 DIAGNOSIS — I471 Supraventricular tachycardia: Secondary | ICD-10-CM | POA: Diagnosis not present

## 2021-08-22 DIAGNOSIS — E785 Hyperlipidemia, unspecified: Secondary | ICD-10-CM

## 2021-08-22 DIAGNOSIS — E1169 Type 2 diabetes mellitus with other specified complication: Secondary | ICD-10-CM | POA: Diagnosis not present

## 2021-08-22 DIAGNOSIS — E875 Hyperkalemia: Secondary | ICD-10-CM

## 2021-08-22 MED ORDER — CARVEDILOL 6.25 MG PO TABS: 6.2500 mg | ORAL_TABLET | Freq: Two times a day (BID) | ORAL | 1 refills | Status: DC

## 2021-08-22 NOTE — Patient Instructions (Signed)
Medication Instructions:   Your physician has recommended you make the following change in your medication:   STOP Metoprolol  START Carvedilol (Coreg) 6.25 mg TWICE daily   *If you need a refill on your cardiac medications before your next appointment, please call your pharmacy*   Lab Work:  BMET today  If you have labs (blood work) drawn today and your tests are completely normal, you will receive your results only by: MyChart Message (if you have MyChart) OR A paper copy in the mail If you have any lab test that is abnormal or we need to change your treatment, we will call you to review the results.   Testing/Procedures:  None ordered   Follow-Up: At [facility], you and your health needs are our priority.  As part of our continuing mission to provide you with exceptional heart care, we have created designated Provider Care Teams.  These Care Teams include your primary Cardiologist (physician) and Advanced Practice Providers (APPs -  Physician Assistants and Nurse Practitioners) who all work together to provide you with the care you need, when you need it.  We recommend signing up for the patient portal called "MyChart".  Sign up information is provided on this After Visit Summary.  MyChart is used to connect with patients for Virtual Visits (Telemedicine).  Patients are able to view lab/test results, encounter notes, upcoming appointments, etc.  Non-urgent messages can be sent to your provider as well.   To learn more about what you can do with MyChart, go to [email or web address].    Your next appointment:   3 month(s)  The format for your next appointment:   In Person  Provider:   You may see [name], MD or one of the following Advanced Practice Providers on your designated Care Team:   Nicolasa Ducking, NP Eula Listen, PA-C Cadence Fransico Michael, [place]

## 2021-08-23 ENCOUNTER — Encounter: Payer: Self-pay | Admitting: Internal Medicine

## 2021-08-23 DIAGNOSIS — I471 Supraventricular tachycardia: Secondary | ICD-10-CM | POA: Insufficient documentation

## 2021-08-23 LAB — BASIC METABOLIC PANEL
BUN/Creatinine Ratio: 11 — ABNORMAL LOW (ref 12–28)
BUN: 8 mg/dL (ref 8–27)
CO2: 24 mmol/L (ref 20–29)
Calcium: 10.1 mg/dL (ref 8.7–10.3)
Chloride: 98 mmol/L (ref 96–106)
Creatinine, Ser: 0.73 mg/dL (ref 0.57–1.00)
Glucose: 94 mg/dL (ref 70–99)
Potassium: 5 mmol/L (ref 3.5–5.2)
Sodium: 137 mmol/L (ref 134–144)
eGFR: 87 mL/min/{1.73_m2} (ref >59)

## 2021-09-21 ENCOUNTER — Other Ambulatory Visit: Payer: Self-pay | Admitting: Medical

## 2021-09-27 ENCOUNTER — Ambulatory Visit: Payer: Medicare HMO | Admitting: Internal Medicine

## 2021-10-19 ENCOUNTER — Telehealth: Payer: Medicare HMO | Admitting: Nurse Practitioner

## 2021-10-19 DIAGNOSIS — N390 Urinary tract infection, site not specified: Secondary | ICD-10-CM

## 2021-10-19 MED ORDER — CEPHALEXIN 500 MG PO CAPS: 500.0000 mg | ORAL_CAPSULE | Freq: Two times a day (BID) | ORAL | 0 refills | Status: AC

## 2021-10-19 NOTE — Progress Notes (Signed)

## 2021-10-19 NOTE — Progress Notes (Signed)
I have spent 5 minutes in review of e-visit questionnaire, review and updating patient chart, medical decision making and response to patient.  ° °Abbigaile Rockman W Jalal Rauch, NP ° °  °

## 2021-11-12 ENCOUNTER — Other Ambulatory Visit: Payer: Self-pay | Admitting: Family Medicine

## 2021-11-12 DIAGNOSIS — M79662 Pain in left lower leg: Secondary | ICD-10-CM

## 2021-11-13 ENCOUNTER — Ambulatory Visit
Admission: RE | Admit: 2021-11-13 | Discharge: 2021-11-13 | Disposition: A | Discharge status: Home / Self Care | Payer: Medicare HMO | Source: Ambulatory Visit | Attending: Family Medicine | Admitting: Family Medicine

## 2021-11-13 DIAGNOSIS — M79662 Pain in left lower leg: Secondary | ICD-10-CM | POA: Insufficient documentation

## 2021-11-20 ENCOUNTER — Encounter: Payer: Self-pay | Admitting: Internal Medicine

## 2021-11-20 ENCOUNTER — Ambulatory Visit: Payer: Medicare HMO | Admitting: Internal Medicine

## 2021-11-20 VITALS — BP 150/100 | HR 67 | Ht 64.0 in | Wt 182.0 lb

## 2021-11-20 DIAGNOSIS — I2510 Atherosclerotic heart disease of native coronary artery without angina pectoris: Secondary | ICD-10-CM | POA: Diagnosis not present

## 2021-11-20 DIAGNOSIS — I471 Supraventricular tachycardia, unspecified: Secondary | ICD-10-CM

## 2021-11-20 DIAGNOSIS — E785 Hyperlipidemia, unspecified: Secondary | ICD-10-CM

## 2021-11-20 DIAGNOSIS — I10 Essential (primary) hypertension: Secondary | ICD-10-CM

## 2021-11-20 DIAGNOSIS — E1169 Type 2 diabetes mellitus with other specified complication: Secondary | ICD-10-CM

## 2021-11-20 NOTE — Progress Notes (Signed)
? ?Follow-up Outpatient Visit ?Date: 11/20/2021 ? ?Primary Care Provider: ?Leim Fabry, MD ?[address] ?[place]  [address] ? ?Chief Complaint: Follow-up coronary artery disease and hypertension ? ?HPI:  Erica Brock is a 71 y.o. female with history of coronary artery disease status post CABG (11/2020; LIMA-LAD, SVG-D1, SVG-OM1, and SVG-PDA), PSVT, hypertension, hyperlipidemia, type 2 diabetes mellitus, obstructive of sleep apnea, obesity, provoked DVT/PE, rheumatoid arthritis, anxiety, and cystic mastopathy, who presents for follow-up of coronary artery disease and palpitations.  I last saw her in February, at which time Erica Brock was feeling well other than sporadic twitches in her chest.  Blood pressure was quite elevated at 192/110, which she attributed to whitecoat hypertension as her home blood pressures were typically normal.  We agreed to switch from metoprolol to carvedilol for improved blood pressure control.  Erica Brock was also concerned that metoprolol may have been contributing to diarrhea. ? ?Today, Erica Brock reports that she is feeling fairly well.  She has not had any chest pain, shortness of breath, or palpitations.  She notes rare orthostatic lightheadedness when she stands up too quickly.  She had been exercising fairly regularly up until a few weeks ago when she began to have problems with her left knee.  She recently underwent a steroid injection in hopes that this will allow her to become more active again.  She also had a lower extremity venous duplex due to swelling in that leg, which was negative for DVT.  She remains compliant with her medications.  Home blood pressures are typically well controlled, usually 110-130/60-80 with only a few outliers above and below this range.  Diarrhea reported at our last visit has improved considerably, though in hindsight Erica Brock thinks that her iron supplement may have been causing this rather than metoprolol.  She is tolerating carvedilol  well. ? ?-------------------------------------------------------------------------------------------------- ? ?Past Medical History:  ?Diagnosis Date  ? Anxiety   ? Arthritis   ? Diabetes mellitus without complication ([facility])   ? Diffuse cystic mastopathy 2012  ? GERD (gastroesophageal reflux disease)   ? Heart murmur   ? ETT myoview 12/09 (Dr. Juel Burrow) no evidence of ischemia, good exercise tolerance - 9 min. echo (11/10): EF 60-65%, nml RV, PASP 25, nml valves  ? Hepatitis   ? as a child   ? HLD (hyperlipidemia)   ? HTN (hypertension)   ? for < 10 years  ? Obesity   ? OSA (obstructive sleep apnea)   ? mild; uses CPAP  ? Rheumatic fever   ? as a child  ? ?Past Surgical History:  ?Procedure Laterality Date  ? arthroscopic surgery    ? L elbow   ? BREAST BIOPSY Left   ? needle bx-neg  ? CARDIAC CATHETERIZATION    ? carpael tunnel release    ? R hand  ? COLONOSCOPY  2008  ? Dr. Sharen Hint  ? CORONARY ARTERY BYPASS GRAFT N/A 12/13/2020  ? Procedure: CORONARY ARTERY BYPASS GRAFTING (CABG) TIMES FOUR USING RIGHT GREATER SAPHENOUS VEIN HARVESTED ENDOSCOPICALLY AND LEFT INTERNAL MAMMARY ARTERY.;  Surgeon: Corliss Skains, MD;  Location: [facility];  Service: Open Heart Surgery;  Laterality: N/A;  ? FOOT SURGERY Left   ? LEFT HEART CATH AND CORONARY ANGIOGRAPHY N/A 12/11/2020  ? Procedure: LEFT HEART CATH AND CORONARY ANGIOGRAPHY;  Surgeon: [name], MD;  Location: [facility];  Service: Cardiovascular;  Laterality: N/A;  ? reconstruction foot and bunion surgery - both feet    ? TEE WITHOUT  CARDIOVERSION N/A 12/13/2020  ? Procedure: TRANSESOPHAGEAL ECHOCARDIOGRAM (TEE);  Surgeon: Corliss Skains, MD;  Location: [facility];  Service: Open Heart Surgery;  Laterality: N/A;  ? TONSILLECTOMY    ? TOTAL ABDOMINAL HYSTERECTOMY  1994  ? ? ?Current Meds  ?Medication Sig  ? aspirin 81 MG tablet Take 1 tablet (81 mg total) by mouth daily.  ? benzonatate (TESSALON PERLES) 100 MG capsule Take 1 capsule (100 mg total) by mouth  3 (three) times daily as needed.  ? carvedilol (COREG) 6.25 MG tablet Take 1 tablet (6.25 mg total) by mouth 2 (two) times daily.  ? clopidogrel (PLAVIX) 75 MG tablet Take 1 tablet (75 mg total) by mouth daily.  ? colchicine 0.6 MG tablet Take 0.6 mg by mouth 2 (two) times daily as needed.  ? ezetimibe (ZETIA) 10 MG tablet TAKE 1 TABLET DAILY  ? furosemide (LASIX) 20 MG tablet Take 1 tablet (20 mg total) by mouth daily as needed (weight gain 3 lbs overnight or 5 lbs in one week).  ? hydroxychloroquine (PLAQUENIL) 200 MG tablet Take 200 mg by mouth 2 (two) times daily.  ? lisinopril (ZESTRIL) 20 MG tablet Take 1 tablet (20 mg total) by mouth daily.  ? metFORMIN (GLUCOPHAGE-XR) 500 MG 24 hr tablet Take 500 mg by mouth at bedtime.  ? montelukast (SINGULAIR) 10 MG tablet Take 10 mg by mouth daily as needed.  ? pantoprazole (PROTONIX) 20 MG tablet Take 20 mg by mouth every other day.  ? potassium chloride SA (KLOR-CON) 10 MEQ tablet Take 1 tablet (10 mEq total) by mouth daily as needed (take only when you take Lasix).  ? rosuvastatin (CRESTOR) 40 MG tablet Take 1 tablet (40 mg total) by mouth daily.  ? ? ?Allergies: Prednisone and Atorvastatin ? ?Social History  ? ?Tobacco Use  ? Smoking status: Never  ? Smokeless tobacco: Never  ? Tobacco comments:  ?  tobacco use - no  ?Vaping Use  ? Vaping Use: Never used  ?Substance Use Topics  ? Alcohol use: Yes  ?  Comment: social   ? Drug use: No  ? ? ?Family History  ?Problem Relation Age of Onset  ? Multiple sclerosis Mother   ? Hyperlipidemia Mother   ? Diabetes Father   ? Hypertension Father   ? Other Father   ?     PPM  ? Heart attack Sister   ?     LATE 30'S, 40  ? Hypertension Sister   ? Hyperlipidemia Sister   ? Aneurysm Brother   ?     brain  ? Breast cancer Neg Hx   ? ? ?Review of Systems: ?A 12-system review of systems was performed and was negative except as noted in the  HPI. ? ?-------------------------------------------------------------------------------------------------- ? ?Physical Exam: ?BP (!) 150/100 (BP Location: Left Arm, Patient Position: Sitting, Cuff Size: Large)   Pulse 67   Ht  (1.626 m)   Wt 182 lb (82.6 kg)   SpO2 98%   BMI 31.24 kg/m?  ? ?General:  NAD. ?Neck: No JVD or HJR. ?Lungs: Clear to auscultation bilaterally without wheezes or crackles. ?Heart: Regular rate and rhythm without murmurs, rubs, or gallops. ?Abdomen: Soft, nontender, nondistended. ?Extremities: No lower extremity edema. ? ?EKG: Normal sinus rhythm with left axis deviation, incomplete right bundle branch block, and poor R wave progression.  No significant change from prior tracing on 08/22/2021. ? ?Lab Results  ?Component Value Date  ? WBC 7.2 01/01/2021  ? HGB 11.7 01/01/2021  ?  HCT 35.7 01/01/2021  ? MCV 91 01/01/2021  ? PLT 438 01/01/2021  ? ? ?Lab Results  ?Component Value Date  ? NA 137 08/22/2021  ? K 5.0 08/22/2021  ? CL 98 08/22/2021  ? CO2 24 08/22/2021  ? BUN 8 08/22/2021  ? CREATININE 0.73 08/22/2021  ? GLUCOSE 94 08/22/2021  ? ALT 21 12/13/2020  ? ? ?Lab Results  ?Component Value Date  ? CHOL 118 12/12/2020  ? HDL 61 12/12/2020  ? LDLCALC 31 12/12/2020  ? TRIG 130 12/12/2020  ? CHOLHDL 1.9 12/12/2020  ? ? ?-------------------------------------------------------------------------------------------------- ? ?ASSESSMENT AND PLAN: ?Coronary artery disease: ?Erica Brock is almost 1 year out from her CABG.  She does not have any angina.  We will discontinue clopidogrel at the [place] of this month, having completed 12 months of DAPT.  She will remain on aspirin 81 mg daily as well as carvedilol, ezetimibe, and rosuvastatin for secondary prevention. ? ?PSVT: ?No further palpitations reported.  We will continue carvedilol 6.25 mg twice daily. ? ?Hyperlipidemia associated with type 2 diabetes mellitus: ?Most recent lipid panel in 05/2021 through Dr. Elonda HuskyAldridge's office showed excellent lipids  with triglycerides 86, LDL 41, and HDL 72.  We will plan to continue current regimen of ezetimibe and rosuvastatin.  Ongoing management of diabetes per Dr. Dayna BarkerAldridge. ? ?Hypertension: ?Blood pressure mildly elevated in the office again today, which correlates very well to her home blood pressure cuff measure

## 2021-11-20 NOTE — Patient Instructions (Signed)
Medication Instructions:  ? ?Your physician has recommended you make the following change in your medication:  ? ?You may STOP Plavix (clopidogrel) at the end of this month (May 2023) ? ?Continue all other remaining medications ? ?*If you need a refill on your cardiac medications before your next appointment, please call your pharmacy* ? ? ?Lab Work: ? ?None ordered ? ?Testing/Procedures: ? ?None ordered ? ? ?Follow-Up: ?At [facility], you and your health needs are our priority.  As part of our continuing mission to provide you with exceptional heart care, we have created designated Provider Care Teams.  These Care Teams include your primary Cardiologist (physician) and Advanced Practice Providers (APPs -  Physician Assistants and Nurse Practitioners) who all work together to provide you with the care you need, when you need it. ? ?We recommend signing up for the patient portal called "MyChart".  Sign up information is provided on this After Visit Summary.  MyChart is used to connect with patients for Virtual Visits (Telemedicine).  Patients are able to view lab/test results, encounter notes, upcoming appointments, etc.  Non-urgent messages can be sent to your provider as well.   ?To learn more about what you can do with MyChart, go to [email or web address].   ? ?Your next appointment:   ?6 month(s) ? ?The format for your next appointment:   ?In Person ? ?Provider:   ?You may see Nelva Bush, MD or one of the following Advanced Practice Providers on your designated Care Team:   ?Murray Hodgkins, NP ?Christell Faith, PA-C ?Cadence Kathlen Mody, PA-C{ ? ? ?Important Information About Sugar ? ? ? ? ? ? ?

## 2021-12-20 ENCOUNTER — Other Ambulatory Visit: Payer: Self-pay

## 2021-12-20 MED ORDER — EZETIMIBE 10 MG PO TABS: 10.0000 mg | ORAL_TABLET | Freq: Every day | ORAL | 3 refills | Status: DC

## 2022-02-03 ENCOUNTER — Other Ambulatory Visit: Payer: Self-pay | Admitting: Internal Medicine

## 2022-04-24 ENCOUNTER — Other Ambulatory Visit: Payer: Self-pay | Admitting: Family Medicine

## 2022-04-24 DIAGNOSIS — Z1231 Encounter for screening mammogram for malignant neoplasm of breast: Secondary | ICD-10-CM

## 2022-05-02 ENCOUNTER — Other Ambulatory Visit: Payer: Self-pay | Admitting: Internal Medicine

## 2022-05-13 ENCOUNTER — Ambulatory Visit
Admission: RE | Admit: 2022-05-13 | Discharge: 2022-05-13 | Disposition: A | Discharge status: Home / Self Care | Payer: Medicare HMO | Source: Ambulatory Visit | Attending: Family Medicine | Admitting: Family Medicine

## 2022-05-13 DIAGNOSIS — Z1231 Encounter for screening mammogram for malignant neoplasm of breast: Secondary | ICD-10-CM | POA: Insufficient documentation

## 2022-05-21 ENCOUNTER — Telehealth: Payer: Self-pay | Admitting: Internal Medicine

## 2022-05-21 ENCOUNTER — Ambulatory Visit: Payer: Medicare HMO | Attending: Internal Medicine | Admitting: Internal Medicine

## 2022-05-21 ENCOUNTER — Encounter: Payer: Self-pay | Admitting: Internal Medicine

## 2022-05-21 ENCOUNTER — Other Ambulatory Visit
Admission: RE | Admit: 2022-05-21 | Discharge: 2022-05-21 | Disposition: A | Discharge status: Home / Self Care | Payer: Medicare HMO | Source: Ambulatory Visit | Attending: Internal Medicine | Admitting: Internal Medicine

## 2022-05-21 VITALS — BP 150/98 | HR 67 | Ht 64.0 in | Wt 178.0 lb

## 2022-05-21 DIAGNOSIS — I10 Essential (primary) hypertension: Secondary | ICD-10-CM

## 2022-05-21 DIAGNOSIS — I6523 Occlusion and stenosis of bilateral carotid arteries: Secondary | ICD-10-CM | POA: Insufficient documentation

## 2022-05-21 DIAGNOSIS — I6522 Occlusion and stenosis of left carotid artery: Secondary | ICD-10-CM | POA: Insufficient documentation

## 2022-05-21 DIAGNOSIS — E785 Hyperlipidemia, unspecified: Secondary | ICD-10-CM

## 2022-05-21 DIAGNOSIS — E1169 Type 2 diabetes mellitus with other specified complication: Secondary | ICD-10-CM

## 2022-05-21 DIAGNOSIS — R42 Dizziness and giddiness: Secondary | ICD-10-CM

## 2022-05-21 DIAGNOSIS — I2510 Atherosclerotic heart disease of native coronary artery without angina pectoris: Secondary | ICD-10-CM | POA: Diagnosis not present

## 2022-05-21 LAB — BASIC METABOLIC PANEL
Anion gap: 7 (ref 5–15)
BUN: 15 mg/dL (ref 8–23)
CO2: 24 mmol/L (ref 22–32)
Calcium: 9.4 mg/dL (ref 8.9–10.3)
Chloride: 105 mmol/L (ref 98–111)
Creatinine, Ser: 0.87 mg/dL (ref 0.44–1.00)
GFR, Estimated: >60 mL/min (ref >60)
Glucose, Bld: 111 mg/dL — ABNORMAL HIGH (ref 70–99)
Potassium: 4.4 mmol/L (ref 3.5–5.1)
Sodium: 136 mmol/L (ref 135–145)

## 2022-05-21 MED ORDER — LISINOPRIL 20 MG PO TABS: ORAL_TABLET | ORAL | 3 refills | Status: DC

## 2022-05-21 NOTE — Patient Instructions (Signed)
Medication Instructions:  Your physician has recommended you make the following change in your medication:   Take Lisinopril 20 mg by mouth in the morning and take 10 mg by mouth in the evening.  *If you need a refill on your cardiac medications before your next appointment, please call your pharmacy*  Lab Work: Your physician recommends that you have lab work today - BMET (STAT) - Please go to the [facility]. You will check in at the front desk to the right as you walk into the atrium. Valet Parking is offered if needed. - No appointment needed. You may go any day between 7 am and 6 pm.  If you have labs (blood work) drawn today and your tests are completely normal, you will receive your results only by: [place] (if you have MyChart) OR A paper copy in the mail If you have any lab test that is abnormal or we need to change your treatment, we will call you to review the results.  Testing/Procedures: Your physician has requested that you have a carotid duplex. This test is an ultrasound of the carotid arteries in your neck. It looks at blood flow through these arteries that supply the brain with blood. Allow one hour for this exam. There are no restrictions or special instructions.   Follow-Up: At [facility], you and your health needs are our priority.  As part of our continuing mission to provide you with exceptional heart care, we have created designated Provider Care Teams.  These Care Teams include your primary Cardiologist (physician) and Advanced Practice Providers (APPs -  Physician Assistants and Nurse Practitioners) who all work together to provide you with the care you need, when you need it.  We recommend signing up for the patient portal called "MyChart".  Sign up information is provided on this After Visit Summary.  MyChart is used to connect with patients for Virtual Visits (Telemedicine).  Patients are able to view lab/test results, encounter notes,  upcoming appointments, etc.  Non-urgent messages can be sent to your provider as well.   To learn more about what you can do with MyChart, go to [email or web address].    Your next appointment:   6 month(s)  The format for your next appointment:   In Person  Provider:   You may see Nelva Bush, MD or one of the following Advanced Practice Providers on your designated Care Team:   Murray Hodgkins, NP Christell Faith, PA-C Cadence Kathlen Mody, PA-C Gerrie Nordmann, NP     Important Information About Sugar

## 2022-05-21 NOTE — Progress Notes (Signed)
Follow-up Outpatient Visit Date: 05/21/2022  Primary Care Provider: Gayland Curry, MD [place] [address]  Chief Complaint: Follow-up coronary artery disease and hypertension  HPI:  Erica Brock is a 71 y.o. female with history of coronary artery disease status post CABG (11/2020; LIMA-LAD, SVG-D1, SVG-OM1, and SVG-PDA), PSVT, hypertension, hyperlipidemia, type 2 diabetes mellitus, obstructive of sleep apnea, obesity, provoked DVT/PE, rheumatoid arthritis, anxiety, and cystic mastopathy, who presents for follow-up of N[place] artery disease.  I last saw her in May, at which time she reported occasional orthostatic lightheadedness if she stands up too quickly.  She otherwise was feeling well from a heart standpoint.  Blood pressure was mildly elevated in the office, though home readings were typically better.  We agreed to discontinue clopidogrel, having completed 12 months of DAPT following her CABG.  No other medication changes or additional testing were pursued.  Today, Erica Brock reports that she has been feeling fairly well.  Her knee pain that she reported at her last visit has resolved.  She has stable exertional dyspnea.  Lower extremity edema has been minimal and controlled with as needed furosemide.  She denies chest pain.  She has rare palpitations that are self-limited and without associated symptoms.  She is concerned about continued orthostatic dizziness.  It seems to be less responsive to her fluid intake and often last for a few minutes after she first gets up.  She does not describe it as lightheadedness but instead reports a feeling of being off balance.  She has not passed out or fallen.  Home blood pressure readings are typically better than today's, usually 102-125/69-90 (she typically only records the best reading in often has an initial reading closer to  140/90). --------------------------------------------------------------------------------------------------  Past Medical History:  Diagnosis Date   Anxiety    Arthritis    Diabetes mellitus without complication ([place])    Diffuse cystic mastopathy 2012   GERD (gastroesophageal reflux disease)    Heart murmur    ETT myoview 12/09 (Dr. Lavera Guise) no evidence of ischemia, good exercise tolerance - 9 min. echo (11/10): EF 60-65%, nml RV, PASP 25, nml valves   Hepatitis    as a child    HLD (hyperlipidemia)    HTN (hypertension)    for < 10 years   Obesity    OSA (obstructive sleep apnea)    mild; uses CPAP   Rheumatic fever    as a child   Past Surgical History:  Procedure Laterality Date   arthroscopic surgery     L elbow    BREAST BIOPSY Left    needle bx-neg   CARDIAC CATHETERIZATION     carpael tunnel release     R hand   COLONOSCOPY  2008   Dr. Epifanio Lesches   CORONARY ARTERY BYPASS GRAFT N/A 12/13/2020   Procedure: CORONARY ARTERY BYPASS GRAFTING (CABG) TIMES FOUR USING RIGHT GREATER SAPHENOUS VEIN HARVESTED ENDOSCOPICALLY AND LEFT INTERNAL MAMMARY ARTERY.;  Surgeon: Lajuana Matte, MD;  Location: [facility];  Service: Open Heart Surgery;  Laterality: N/A;   FOOT SURGERY Left    LEFT HEART CATH AND CORONARY ANGIOGRAPHY N/A 12/11/2020   Procedure: LEFT HEART CATH AND CORONARY ANGIOGRAPHY;  Surgeon: Nelva Bush, MD;  Location: [facility];  Service: Cardiovascular;  Laterality: N/A;   reconstruction foot and bunion surgery - both feet     TEE WITHOUT CARDIOVERSION N/A 12/13/2020   Procedure: TRANSESOPHAGEAL ECHOCARDIOGRAM (TEE);  Surgeon: Lajuana Matte, MD;  Location: [facility]  OR;  Service: Open Heart Surgery;  Laterality: N/A;   TONSILLECTOMY     TOTAL ABDOMINAL HYSTERECTOMY  1994    Current Meds  Medication Sig   aspirin 81 MG tablet Take 1 tablet (81 mg total) by mouth daily.   benzonatate (TESSALON PERLES) 100 MG capsule Take 1 capsule (100 mg total) by mouth  3 (three) times daily as needed.   carvedilol (COREG) 6.25 MG tablet TAKE 1 TABLET BY MOUTH TWICE A DAY   colchicine 0.6 MG tablet Take 0.6 mg by mouth 2 (two) times daily as needed.   ezetimibe (ZETIA) 10 MG tablet Take 1 tablet (10 mg total) by mouth daily.   furosemide (LASIX) 20 MG tablet Take 1 tablet (20 mg total) by mouth daily as needed (weight gain 3 lbs overnight or 5 lbs in one week).   hydroxychloroquine (PLAQUENIL) 200 MG tablet Take 200 mg by mouth 2 (two) times daily.   lisinopril (ZESTRIL) 20 MG tablet Take 1 tablet (20 mg total) by mouth daily.   montelukast (SINGULAIR) 10 MG tablet Take 10 mg by mouth daily as needed.   potassium chloride SA (KLOR-CON) 10 MEQ tablet Take 1 tablet (10 mEq total) by mouth daily as needed (take only when you take Lasix).   rosuvastatin (CRESTOR) 40 MG tablet Take 1 tablet (40 mg total) by mouth daily.    Allergies: Prednisone and Atorvastatin  Social History   Tobacco Use   Smoking status: Never   Smokeless tobacco: Never   Tobacco comments:    tobacco use - no  Vaping Use   Vaping Use: Never used  Substance Use Topics   Alcohol use: Yes    Comment: social    Drug use: No    Family History  Problem Relation Age of Onset   Multiple sclerosis Mother    Hyperlipidemia Mother    Diabetes Father    Hypertension Father    Other Father        PPM   Heart attack Sister        LATE 30'S, 40   Hypertension Sister    Hyperlipidemia Sister    Aneurysm Brother        brain   Breast cancer Neg Hx     Review of Systems: A 12-system review of systems was performed and was negative except as noted in the HPI.  --------------------------------------------------------------------------------------------------  Physical Exam: BP (!) 146/100 (BP Location: Left Arm, Patient Position: Sitting, Cuff Size: Large)   Pulse 67   Ht 5\' 4"  (1.626 m)   Wt 178 lb (80.7 kg)   SpO2 98%   BMI 30.55 kg/m  Repeat blood pressure 150/98  General:   NAD. Neck: No JVD or HJR. Lungs: Clear to auscultation bilaterally without wheezes or crackles. Heart: Regular rate and rhythm without murmurs, rubs, or gallops. Abdomen: Soft, nontender, nondistended. Extremities: No lower extremity edema.  EKG: Sinus rhythm with left axis deviation and rSR' as well as poor R wave progression.  No significant change since prior tracing on 11/20/2021.  Lab Results  Component Value Date   WBC 7.2 01/01/2021   HGB 11.7 01/01/2021   HCT 35.7 01/01/2021   MCV 91 01/01/2021   PLT 438 01/01/2021    Lab Results  Component Value Date   NA 137 08/22/2021   K 5.0 08/22/2021   CL 98 08/22/2021   CO2 24 08/22/2021   BUN 8 08/22/2021   CREATININE 0.73 08/22/2021   GLUCOSE 94 08/22/2021   ALT  21 12/13/2020    Lab Results  Component Value Date   CHOL 118 12/12/2020   HDL 61 12/12/2020   LDLCALC 31 12/12/2020   TRIG 130 12/12/2020   CHOLHDL 1.9 12/12/2020    --------------------------------------------------------------------------------------------------  ASSESSMENT AND PLAN: Coronary artery disease: No angina reported.  Chronic exertional dyspnea is stable.  EKG does not show any acute ischemic changes.  We will plan to continue current medications for secondary prevention, including indefinite aspirin 81 mg daily and combined ezetimibe and rosuvastatin.  Most recent lipid panel from 05/2021 was at goal with LDL of 41.  Hypertension: Blood pressure is again suboptimally controlled today though most home readings are better.  Erica Brock certainly may have an element of whitecoat hypertension.  I have encouraged her to continue to minimize her sodium intake.  We agreed to increase lisinopril to 20 mg every morning and 10 mg every afternoon.  I will check a BMP today to ensure stable renal function and potassium.  She is scheduled for follow-up labs through Dr. Nilda Riggs in about 1.5 weeks.  Continue carvedilol 6.25 mg twice daily.  Carotid artery stenosis  and dizziness: Erica Brock reports orthostatic dizziness that is not frankly lightheadedness.  She is noted to have mild bilateral ICA stenoses on pre-CABG Dopplers in 11/2020.  The presentation would be unusual, we have agreed to repeat carotid Dopplers to ensure that worsening cerebrovascular disease is not contributing to her symptoms.  She should continue aspirin and aggressive lipid therapy to prevent progression of disease.  Hyperlipidemia associated with type 2 diabetes mellitus: Continue rosuvastatin and ezetimibe pending repeat lipid panel next month with Dr. Dayna Barker, if LDL remains well below 55, discontinuation of ezetimibe and rosuvastatin monotherapy could be considered.  Ongoing management of DM per Dr. Dayna Barker.  Follow-up: Return to clinic in 6 months.  [name], MD 05/21/2022 10:37 AM

## 2022-05-21 NOTE — Telephone Encounter (Signed)
BMP normal other than mild hyperglycemia that is expected in this nonfasting sample.  Potassium and creatinine normal.  I advised Erica Brock that it is okay to increase lisinopril as discussed at our office visit today.  Nelva Bush, MD [facility]

## 2022-07-01 ENCOUNTER — Other Ambulatory Visit: Payer: Self-pay | Admitting: Internal Medicine

## 2022-07-01 DIAGNOSIS — I2510 Atherosclerotic heart disease of native coronary artery without angina pectoris: Secondary | ICD-10-CM

## 2022-07-01 DIAGNOSIS — E1169 Type 2 diabetes mellitus with other specified complication: Secondary | ICD-10-CM

## 2022-07-01 DIAGNOSIS — I6523 Occlusion and stenosis of bilateral carotid arteries: Secondary | ICD-10-CM

## 2022-07-01 DIAGNOSIS — R42 Dizziness and giddiness: Secondary | ICD-10-CM

## 2022-07-01 DIAGNOSIS — I10 Essential (primary) hypertension: Secondary | ICD-10-CM

## 2022-07-07 ENCOUNTER — Other Ambulatory Visit: Payer: Self-pay

## 2022-07-07 MED ORDER — POTASSIUM CHLORIDE CRYS ER 10 MEQ PO TBCR: 10.0000 meq | EXTENDED_RELEASE_TABLET | Freq: Every day | ORAL | 2 refills | Status: DC | PRN

## 2022-07-18 ENCOUNTER — Ambulatory Visit: Payer: Medicare HMO | Attending: Internal Medicine

## 2022-07-18 DIAGNOSIS — I6523 Occlusion and stenosis of bilateral carotid arteries: Secondary | ICD-10-CM | POA: Diagnosis not present

## 2022-08-01 ENCOUNTER — Other Ambulatory Visit: Payer: Self-pay | Admitting: Internal Medicine

## 2022-10-02 ENCOUNTER — Telehealth: Payer: Self-pay | Admitting: Internal Medicine

## 2022-10-02 ENCOUNTER — Other Ambulatory Visit: Payer: Self-pay | Admitting: Internal Medicine

## 2022-10-02 MED ORDER — CARVEDILOL 6.25 MG PO TABS: 6.2500 mg | ORAL_TABLET | Freq: Two times a day (BID) | ORAL | 0 refills | Status: DC

## 2022-10-02 NOTE — Telephone Encounter (Signed)
*  STAT* If patient is at the pharmacy, call can be transferred to refill team.   1. Which medications need to be refilled? (please list name of each medication and dose if known) carvedilol (COREG) 6.25 MG tablet   2. Which pharmacy/location (including street and city if local pharmacy) is medication to be sent to? [facility] [place], [place] to [facility]   3. Do they need a 30 day or 90 day supply? [place]

## 2022-11-01 ENCOUNTER — Other Ambulatory Visit: Payer: Self-pay | Admitting: Internal Medicine

## 2022-11-19 ENCOUNTER — Encounter: Payer: Self-pay | Admitting: Internal Medicine

## 2022-11-19 ENCOUNTER — Ambulatory Visit: Payer: Medicare HMO | Attending: Internal Medicine | Admitting: Internal Medicine

## 2022-11-19 VITALS — BP 138/94 | HR 67 | Ht 64.0 in | Wt 173.8 lb

## 2022-11-19 DIAGNOSIS — I6522 Occlusion and stenosis of left carotid artery: Secondary | ICD-10-CM

## 2022-11-19 DIAGNOSIS — E1169 Type 2 diabetes mellitus with other specified complication: Secondary | ICD-10-CM

## 2022-11-19 DIAGNOSIS — I2510 Atherosclerotic heart disease of native coronary artery without angina pectoris: Secondary | ICD-10-CM | POA: Diagnosis not present

## 2022-11-19 DIAGNOSIS — I10 Essential (primary) hypertension: Secondary | ICD-10-CM | POA: Diagnosis not present

## 2022-11-19 DIAGNOSIS — E785 Hyperlipidemia, unspecified: Secondary | ICD-10-CM

## 2022-11-19 MED ORDER — LISINOPRIL 20 MG PO TABS: 20.0000 mg | ORAL_TABLET | Freq: Two times a day (BID) | ORAL | 3 refills | Status: DC

## 2022-11-19 NOTE — Progress Notes (Unsigned)
Follow-up Outpatient Visit Date: 11/19/2022  Primary Care Provider: Leim Fabry, MD [address] [place] [address]  Chief Complaint: Follow-up coronary artery disease  HPI:  Erica Brock is a 72 y.o. female with history of coronary artery disease status post CABG (11/2020; LIMA-LAD, SVG-D1, SVG-OM1, and SVG-PDA), PSVT, hypertension, hyperlipidemia, type 2 diabetes mellitus, obstructive of sleep apnea, obesity, provoked DVT/PE, rheumatoid arthritis, anxiety, and cystic mastopathy, who presents for follow-up of coronary artery disease.  I last saw her in November, at which time she was feeling fairly well with stable exertional dyspnea and minimal leg edema.  She denied chest pain.  She was concerned about occasional orthostatic lightheadedness and rare palpitations.  Due to suboptimal blood pressure control, we agreed to change lisinopril to 20 mg every morning and 10 mg every afternoon.  Today, Erica Brock reports that she is feeling fairly well.  She reports a single episode of palpitations lasting a few minutes that woke her up about a month ago.  There were noes associated symptoms, with the palpitations stopping spontaneously.  She has not had any chest pain or shortness of breath.  She notes occasional lightheadedness when she first stands up but has not fallen or passed out.  Home blood pressures are typically in the 130s over 80s.  She has not been taking her as needed furosemide much, as she has had minimal leg edema.  She is in the process of packing up her home in anticipation of a move.  --------------------------------------------------------------------------------------------------  Past Medical History:  Diagnosis Date   Anxiety    Arthritis    Diabetes mellitus without complication ([facility])    Diffuse cystic mastopathy 2012   GERD (gastroesophageal reflux disease)    Heart murmur    ETT myoview 12/09 (Dr. Juel Burrow) no evidence of ischemia, good exercise tolerance - 9 min.  echo (11/10): EF 60-65%, nml RV, PASP 25, nml valves   Hepatitis    as a child    HLD (hyperlipidemia)    HTN (hypertension)    for < 10 years   Obesity    OSA (obstructive sleep apnea)    mild; uses CPAP   Rheumatic fever    as a child   Past Surgical History:  Procedure Laterality Date   arthroscopic surgery     L elbow    BREAST BIOPSY Left    needle bx-neg   CARDIAC CATHETERIZATION     carpael tunnel release     R hand   COLONOSCOPY  2008   Dr. Sharen Hint   CORONARY ARTERY BYPASS GRAFT N/A 12/13/2020   Procedure: CORONARY ARTERY BYPASS GRAFTING (CABG) TIMES FOUR USING RIGHT GREATER SAPHENOUS VEIN HARVESTED ENDOSCOPICALLY AND LEFT INTERNAL MAMMARY ARTERY.;  Surgeon: Corliss Skains, MD;  Location: [facility];  Service: Open Heart Surgery;  Laterality: N/A;   FOOT SURGERY Left    LEFT HEART CATH AND CORONARY ANGIOGRAPHY N/A 12/11/2020   Procedure: LEFT HEART CATH AND CORONARY ANGIOGRAPHY;  Surgeon: [name], MD;  Location: [facility];  Service: Cardiovascular;  Laterality: N/A;   reconstruction foot and bunion surgery - both feet     TEE WITHOUT CARDIOVERSION N/A 12/13/2020   Procedure: TRANSESOPHAGEAL ECHOCARDIOGRAM (TEE);  Surgeon: Corliss Skains, MD;  Location: [facility];  Service: Open Heart Surgery;  Laterality: N/A;   TONSILLECTOMY     TOTAL ABDOMINAL HYSTERECTOMY  1994    Current Meds  Medication Sig   aspirin 81 MG tablet Take 1 tablet (81 mg  total) by mouth daily.   carvedilol (COREG) 6.25 MG tablet TAKE 1 TABLET BY MOUTH TWICE A DAY   colchicine 0.6 MG tablet Take 0.6 mg by mouth 2 (two) times daily as needed.   ezetimibe (ZETIA) 10 MG tablet Take 1 tablet (10 mg total) by mouth daily.   furosemide (LASIX) 20 MG tablet Take 1 tablet (20 mg total) by mouth daily as needed (weight gain 3 lbs overnight or 5 lbs in one week).   hydroxychloroquine (PLAQUENIL) 200 MG tablet Take 200 mg by mouth 2 (two) times daily.   lisinopril (ZESTRIL) 20 MG tablet  Take 20 mg (1 tablet) by mouth in the morning and take 10 mg (1/2 tablet) by mouth in the evening.   montelukast (SINGULAIR) 10 MG tablet Take 10 mg by mouth daily as needed.   pantoprazole (PROTONIX) 20 MG tablet Take 20 mg by mouth daily as needed for heartburn.   potassium chloride (KLOR-CON M10) 10 MEQ tablet TAKE 1 TABLET (10 MEQ TOTAL) BY MOUTH DAILY AS NEEDED (TAKE ONLY WHEN YOU TAKE LASIX).   rosuvastatin (CRESTOR) 40 MG tablet Take 1 tablet (40 mg total) by mouth daily.    Allergies: Prednisone and Atorvastatin  Social History   Tobacco Use   Smoking status: Never   Smokeless tobacco: Never   Tobacco comments:    tobacco use - no  Vaping Use   Vaping Use: Never used  Substance Use Topics   Alcohol use: Yes    Comment: social    Drug use: No    Family History  Problem Relation Age of Onset   Multiple sclerosis Mother    Hyperlipidemia Mother    Diabetes Father    Hypertension Father    Other Father        PPM   Heart attack Sister        LATE 30'S, 40   Hypertension Sister    Hyperlipidemia Sister    Aneurysm Brother        brain   Breast cancer Neg Hx     Review of Systems: A 12-system review of systems was performed and was negative except as noted in the HPI.  --------------------------------------------------------------------------------------------------  Physical Exam: BP (!) 138/94 (BP Location: Left Arm, Patient Position: Sitting, Cuff Size: Large)   Pulse 67   Ht 5\' 4"  (1.626 m)   Wt 173 lb 12.8 oz (78.8 kg)   SpO2 98%   BMI 29.83 kg/m   General:  NAD. Neck: No JVD or HJR. Lungs: Clear to auscultation bilaterally without wheezes or crackles. Heart: Regular rate and rhythm without murmurs, rubs, or gallops. Abdomen: Soft, nontender, nondistended. Extremities: No lower extremity edema.  Left ankle/foot brace noted.  EKG: Normal sinus rhythm with left axis deviation and incomplete right bundle branch block.  No significant change from prior  tracing on 05/21/2022.  Lab Results  Component Value Date   WBC 7.2 01/01/2021   HGB 11.7 01/01/2021   HCT 35.7 01/01/2021   MCV 91 01/01/2021   PLT 438 01/01/2021    Lab Results  Component Value Date   NA 136 05/21/2022   K 4.4 05/21/2022   CL 105 05/21/2022   CO2 24 05/21/2022   BUN 15 05/21/2022   CREATININE 0.87 05/21/2022   GLUCOSE 111 (H) 05/21/2022   ALT 21 12/13/2020    Lab Results  Component Value Date   CHOL 118 12/12/2020   HDL 61 12/12/2020   LDLCALC 31 12/12/2020   TRIG 130 12/12/2020  CHOLHDL 1.9 12/12/2020    --------------------------------------------------------------------------------------------------  ASSESSMENT AND PLAN: Coronary artery disease and hyperlipidemia associated with type 2 diabetes mellitus: No angina reported.  Continue secondary prevention with aspirin, carvedilol, and rosuvastatin.  Given excellent LDL control, we will discontinue ezetimibe.  Hypertension: Blood pressure suboptimally controlled again today and at home (goal blood pressure less than 130/80).  We have agreed to increase lisinopril to 20 mg twice daily.  Erica Brock is scheduled for labs and follow-up with Dr. Dayna Barker in a few weeks.  Left carotid artery stenosis: Carotid Doppler in January showed mild left and absent right carotid artery disease.  Dizziness unlikely related to this.  Continue aspirin and statin therapy to prevent progression of disease.  Follow-up: Return to clinic in 6 months.  [name], MD 11/19/2022 10:31 AM

## 2022-11-19 NOTE — Patient Instructions (Signed)
Medication Instructions:  Your physician recommends the following medication changes.  STOP TAKING: Zetia   INCREASE: Lisinopril to 20 mg twice daily.   *If you need a refill on your cardiac medications before your next appointment, please call your pharmacy*   Lab Work: No labs ordered today.  Testing/Procedures: No testing ordered today.    Follow-Up: At [facility], you and your health needs are our priority.  As part of our continuing mission to provide you with exceptional heart care, we have created designated Provider Care Teams.  These Care Teams include your primary Cardiologist (physician) and Advanced Practice Providers (APPs -  Physician Assistants and Nurse Practitioners) who all work together to provide you with the care you need, when you need it.  We recommend signing up for the patient portal called "MyChart".  Sign up information is provided on this After Visit Summary.  MyChart is used to connect with patients for Virtual Visits (Telemedicine).  Patients are able to view lab/test results, encounter notes, upcoming appointments, etc.  Non-urgent messages can be sent to your provider as well.   To learn more about what you can do with MyChart, go to [email or web address].    Your next appointment:   6 month(s)  Provider:   You may see [name], MD or one of the following Advanced Practice Providers on your designated Care Team:   Nicolasa Ducking, NP Eula Listen, PA-C Cadence Fransico Michael, PA-C Charlsie Quest, NP

## 2022-11-20 ENCOUNTER — Encounter: Payer: Self-pay | Admitting: Internal Medicine

## 2022-12-08 ENCOUNTER — Encounter: Payer: Self-pay | Admitting: Internal Medicine

## 2022-12-24 ENCOUNTER — Other Ambulatory Visit: Payer: Self-pay | Admitting: Internal Medicine

## 2023-01-31 ENCOUNTER — Other Ambulatory Visit: Payer: Self-pay | Admitting: Internal Medicine

## 2023-04-21 IMAGING — DX DG CHEST 2V
2 series · 2 of 2 positions shown · non-contrast
Comparison: Chest radiograph dated 07/18/2017

CLINICAL DATA: 70-year-old female with preop chest radiograph.

EXAM:
CHEST - 2 VIEW

[chest pa]
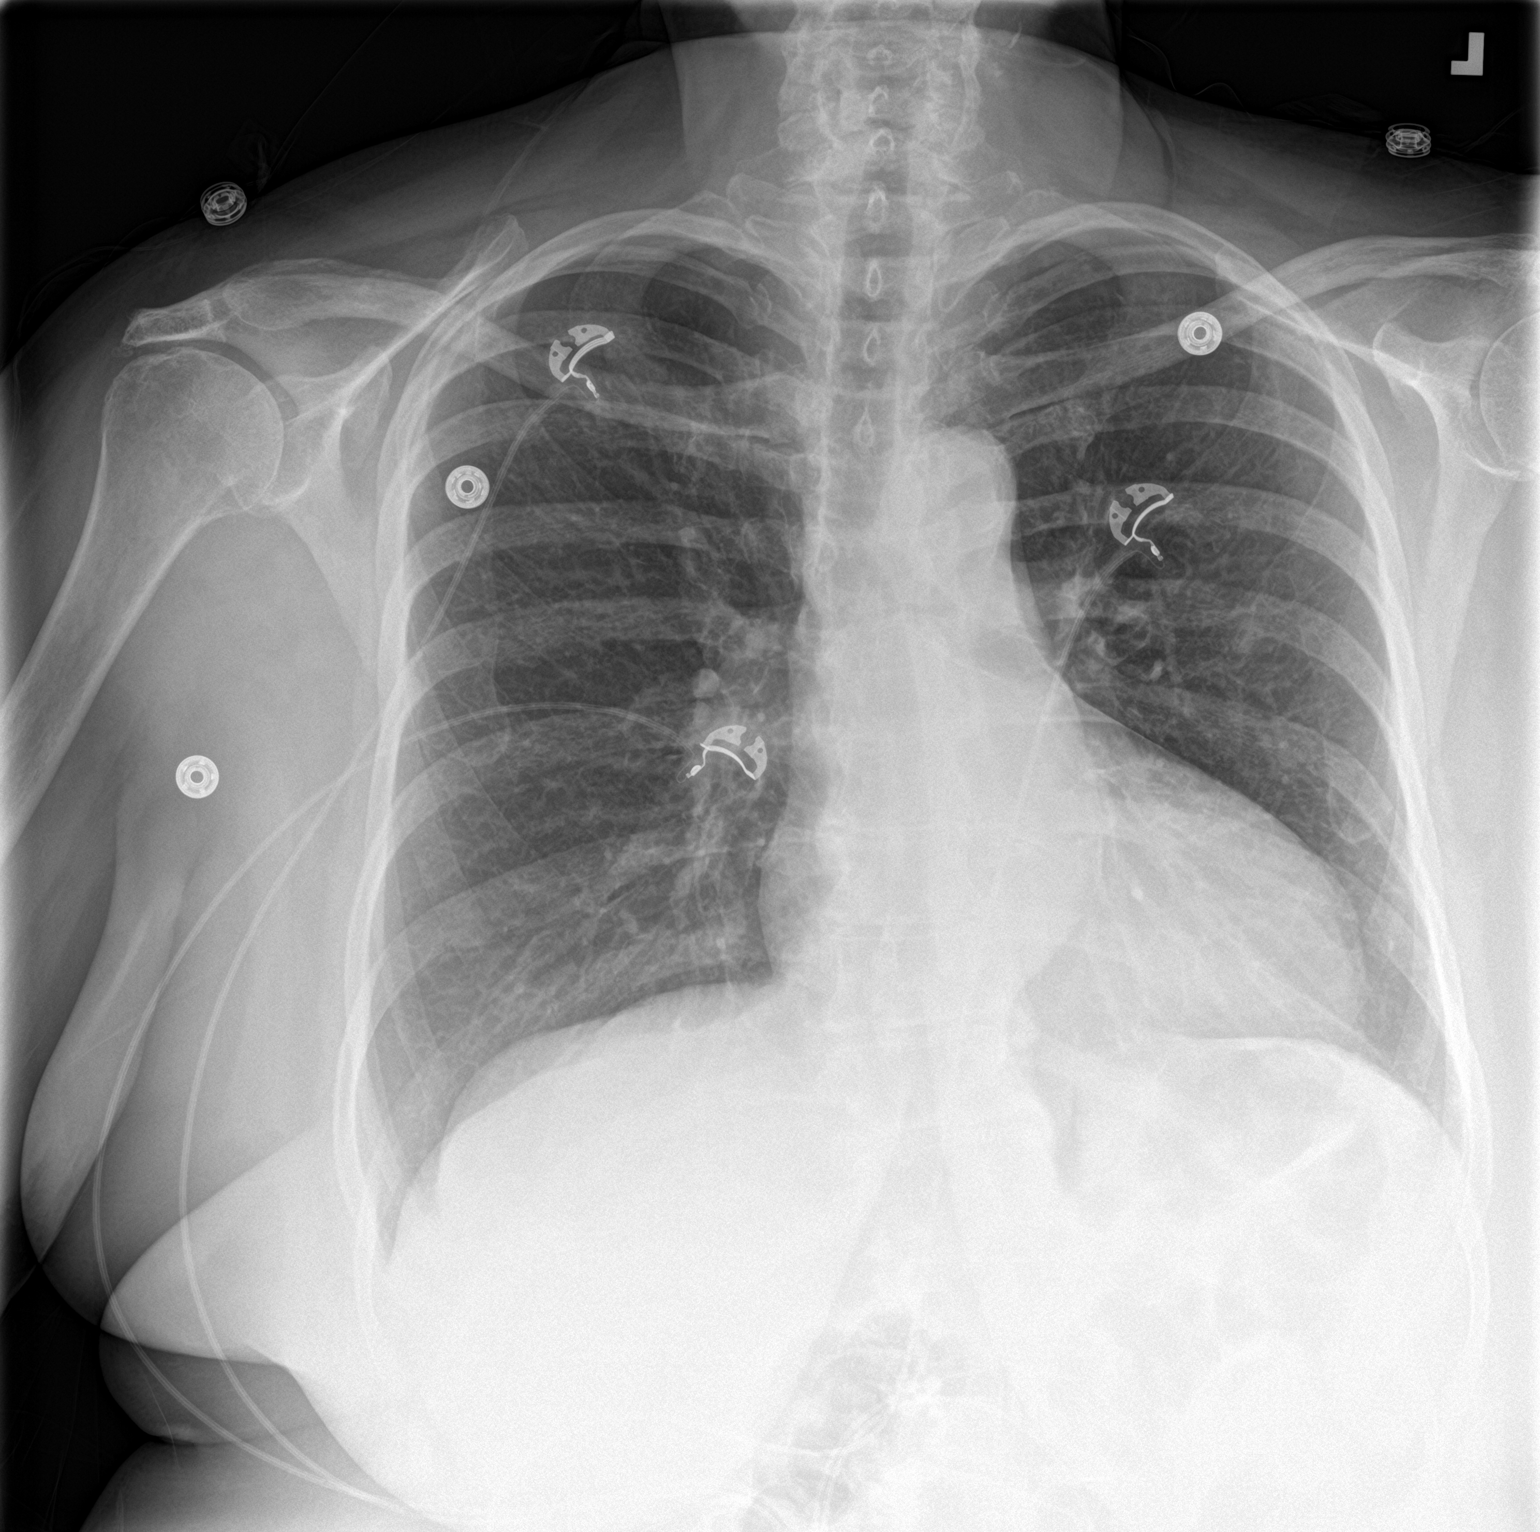

[chest lat]
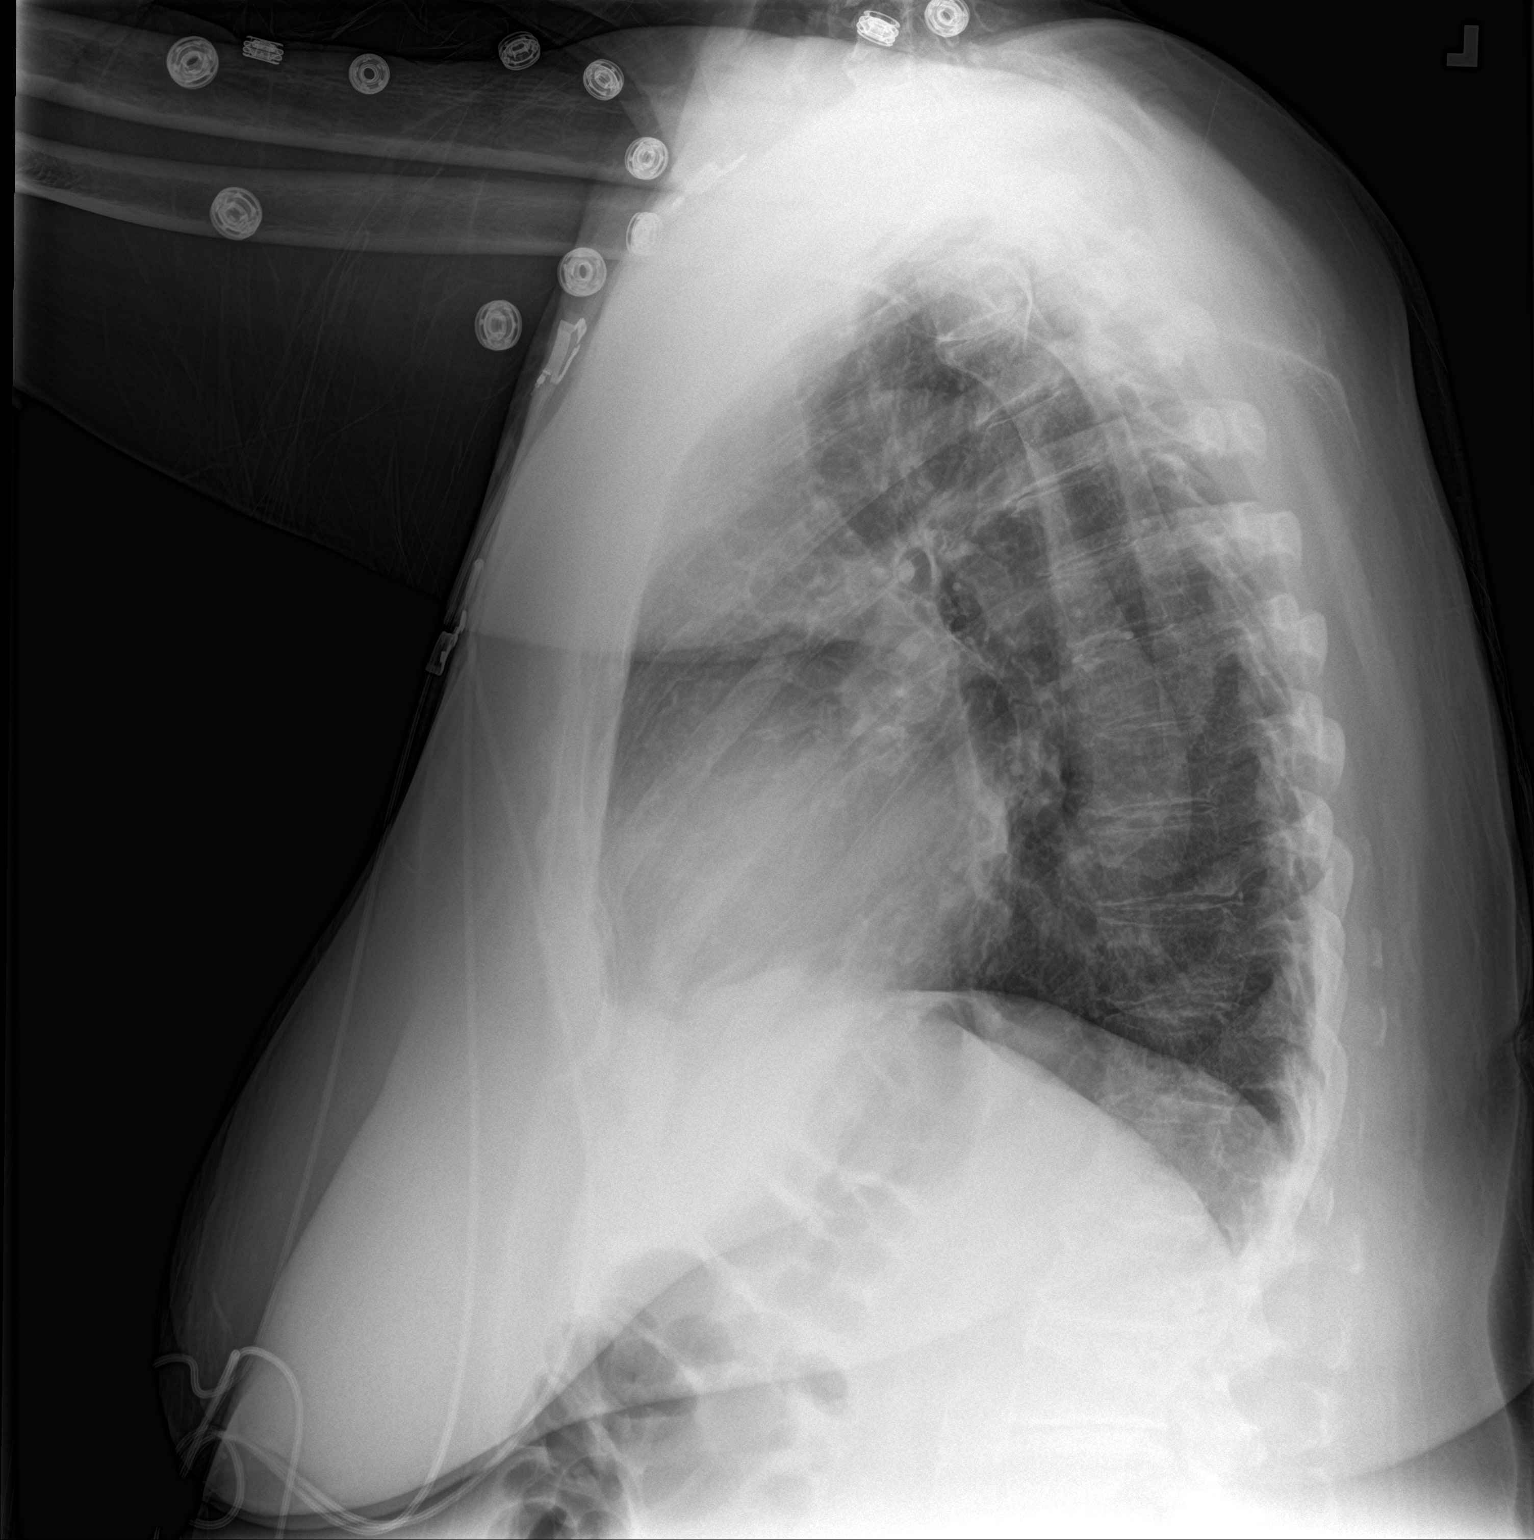

[2 of 2 positions shown; findings below may reference images not displayed]

FINDINGS: The lungs are clear. There is no pleural effusion pneumothorax.
Top-normal cardiac size. Atherosclerotic calcification of the aorta.
No acute osseous pathology.
IMPRESSION: No active cardiopulmonary disease.

## 2023-04-25 IMAGING — DX DG CHEST 2V
2 series · 2 of 2 positions shown · non-contrast
Comparison: 12/15/2020

CLINICAL DATA: 70-year-old female with history of pneumothorax.

EXAM:
CHEST - 2 VIEW

[chest lat]
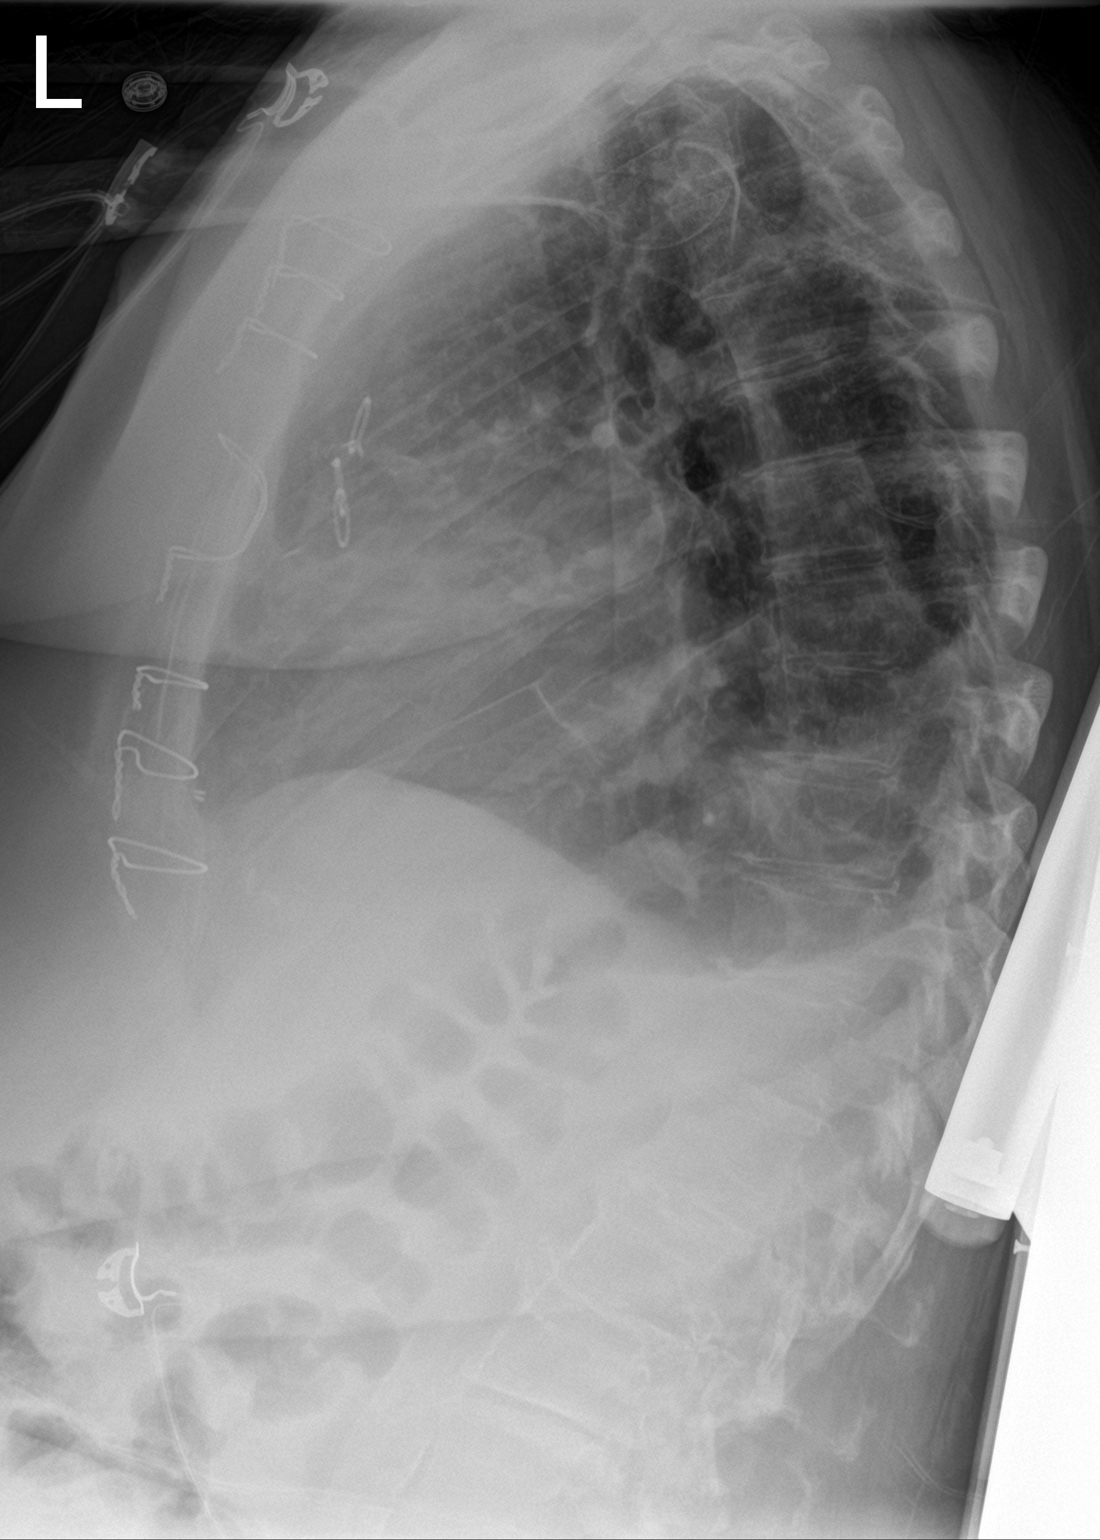

[chest ap]
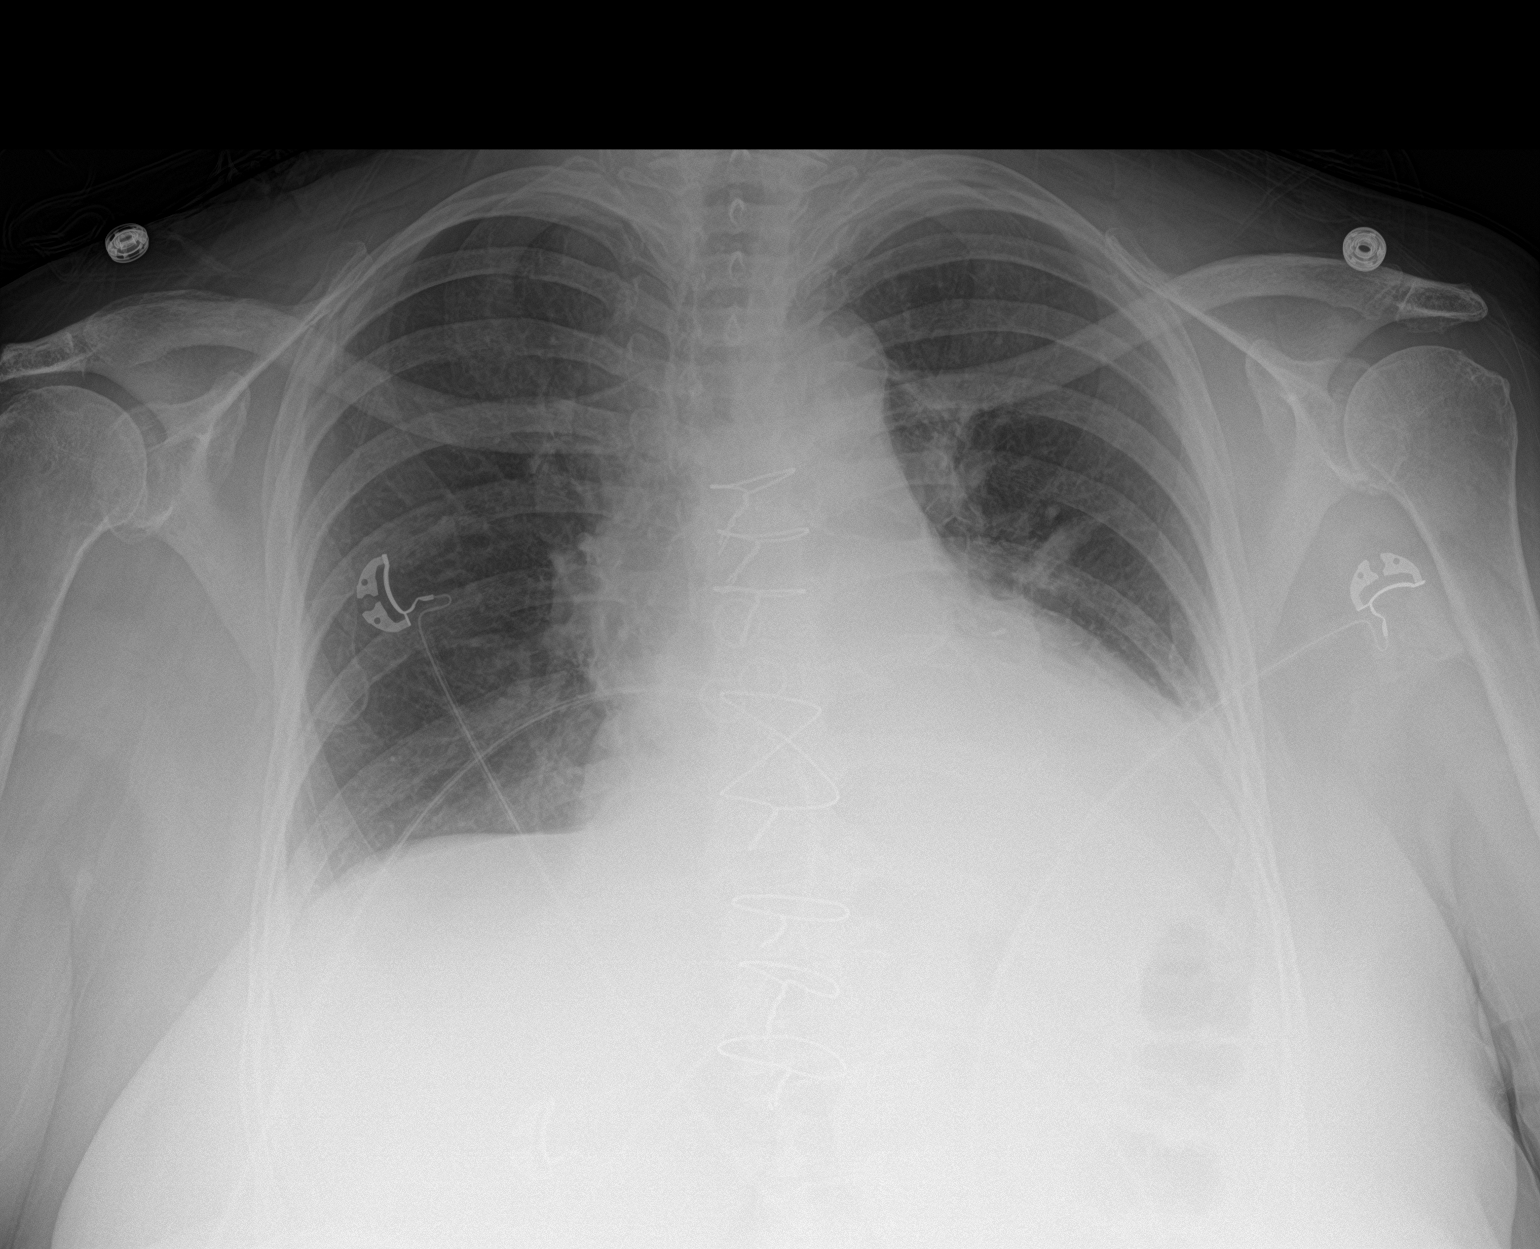

[2 of 2 positions shown; findings below may reference images not displayed]

FINDINGS: The mediastinal contours are within normal limits. Stable
cardiomegaly. Similar appearing postsurgical changes after coronary
artery bypass graft. Interval removal of bilateral thoracostomy
tubes. No pneumothorax. Decreased size of trace residual left
pleural effusion. Left basilar subsegmental atelectasis. The right
lung is clear. No acute osseous abnormality. Median sternotomy wires
in place.
IMPRESSION: Trace but smaller residual left pleural effusion.  No pneumothorax.

## 2023-04-27 ENCOUNTER — Other Ambulatory Visit: Payer: Self-pay | Admitting: Family Medicine

## 2023-04-27 DIAGNOSIS — Z1231 Encounter for screening mammogram for malignant neoplasm of breast: Secondary | ICD-10-CM

## 2023-05-06 ENCOUNTER — Other Ambulatory Visit: Payer: Self-pay | Admitting: Internal Medicine

## 2023-05-13 ENCOUNTER — Encounter: Payer: Self-pay | Admitting: Internal Medicine

## 2023-05-13 ENCOUNTER — Ambulatory Visit: Payer: Medicare HMO | Attending: Internal Medicine | Admitting: Internal Medicine

## 2023-05-13 VITALS — BP 180/100 | HR 69 | Ht 64.0 in | Wt 166.0 lb

## 2023-05-13 DIAGNOSIS — I10 Essential (primary) hypertension: Secondary | ICD-10-CM | POA: Diagnosis not present

## 2023-05-13 DIAGNOSIS — I25119 Atherosclerotic heart disease of native coronary artery with unspecified angina pectoris: Secondary | ICD-10-CM | POA: Diagnosis not present

## 2023-05-13 DIAGNOSIS — Z79899 Other long term (current) drug therapy: Secondary | ICD-10-CM

## 2023-05-13 DIAGNOSIS — E1169 Type 2 diabetes mellitus with other specified complication: Secondary | ICD-10-CM | POA: Diagnosis not present

## 2023-05-13 DIAGNOSIS — R42 Dizziness and giddiness: Secondary | ICD-10-CM

## 2023-05-13 DIAGNOSIS — E785 Hyperlipidemia, unspecified: Secondary | ICD-10-CM

## 2023-05-13 NOTE — Patient Instructions (Addendum)
Medication Instructions:  Your physician recommends that you continue on your current medications as directed. Please refer to the Current Medication list given to you today.   *If you need a refill on your cardiac medications before your next appointment, please call your pharmacy*   Lab Work: Your provider would like for you to have following labs drawn today (CBC, CMP, TSH).     Testing/Procedures: Cardiac PET  Please see instructions below    Follow-Up: At [facility], you and your health needs are our priority.  As part of our continuing mission to provide you with exceptional heart care, we have created designated Provider Care Teams.  These Care Teams include your primary Cardiologist (physician) and Advanced Practice Providers (APPs -  Physician Assistants and Nurse Practitioners) who all work together to provide you with the care you need, when you need it.  We recommend signing up for the patient portal called "MyChart".  Sign up information is provided on this After Visit Summary.  MyChart is used to connect with patients for Virtual Visits (Telemedicine).  Patients are able to view lab/test results, encounter notes, upcoming appointments, etc.  Non-urgent messages can be sent to your provider as well.   To learn more about what you can do with MyChart, go to [email or web address].    Your next appointment:   1 month(s)  Provider:   You may see [name], MD or one of the following Advanced Practice Providers on your designated Care Team:   Nicolasa Ducking, NP Eula Listen, PA-C Cadence Fransico Michael, PA-C Charlsie Quest, NP    How to Prepare for Your Cardiac PET/CT Stress Test:  1. Please do not take these medications before your test:   Medications that may interfere with the cardiac pharmacological stress agent (ex. nitrates - including erectile dysfunction medications, isosorbide mononitrate, tamulosin or beta-blockers) the day of the exam. (Erectile  dysfunction medication should be held for at least 72 hrs prior to test) Carvedilol morning of test Theophylline containing medications for 12 hours. Dipyridamole 48 hours prior to the test. Your remaining medications may be taken with water.  2. Nothing to eat or drink, except water, 3 hours prior to arrival time.   NO caffeine/decaffeinated products, or chocolate 12 hours prior to arrival.  3. NO perfume, cologne or lotion on chest or abdomen area.          - FEMALES - Please avoid wearing dresses to this appointment.  4. Total time is 1 to 2 hours; you may want to bring reading material for the waiting time.  5. Please report to Radiology at the [facility] Main Entrance 30 minutes early for your test.  [address] [place], [place] [address]  6. Please report to Radiology at [facility] Main Entrance, medical mall, 30 mins prior to your test.  [address]  [place], [place]  [phone]  Diabetic Preparation:  Hold oral medications. You may take NPH and Lantus insulin. Do not take Humalog or Humulin R (Regular Insulin) the day of your test. Check blood sugars prior to leaving the house. If able to eat breakfast prior to 3 hour fasting, you may take all medications, including your insulin, Do not worry if you miss your breakfast dose of insulin - start at your next meal. Patients who wear a continuous glucose monitor MUST remove the device prior to scanning.  IF YOU THINK YOU MAY BE PREGNANT, OR ARE NURSING PLEASE INFORM THE TECHNOLOGIST.  In preparation for your appointment, medication and supplies will be purchased.  Appointment availability is limited, so if you need to cancel or reschedule, please call the Radiology Department at [phone] [facility]) OR [phone] [facility])  24 hours in advance to avoid a cancellation fee of $100.00  What to Expect After you Arrive:  Once you arrive and check in for your appointment, you  will be taken to a preparation room within the Radiology Department.  A technologist or Nurse will obtain your medical history, verify that you are correctly prepped for the exam, and explain the procedure.  Afterwards,  an IV will be started in your arm and electrodes will be placed on your skin for EKG monitoring during the stress portion of the exam. Then you will be escorted to the PET/CT scanner.  There, staff will get you positioned on the scanner and obtain a blood pressure and EKG.  During the exam, you will continue to be connected to the EKG and blood pressure machines.  A small, safe amount of a radioactive tracer will be injected in your IV to obtain a series of pictures of your heart along with an injection of a stress agent.    After your Exam:  It is recommended that you eat a meal and drink a caffeinated beverage to counter act any effects of the stress agent.  Drink plenty of fluids for the remainder of the day and urinate frequently for the first couple of hours after the exam.  Your doctor will inform you of your test results within 7-10 business days.  For more information and frequently asked questions, please visit our website : [email or web address]  For questions about your test or how to prepare for your test, please call: Cardiac Imaging Nurse Navigators Office: [phone]

## 2023-05-13 NOTE — Progress Notes (Signed)
Cardiology Office Note:  .   Date:  05/13/2023  ID:  KIELEE BUI, DOB 1951-06-12, MRN [id number] PCP: Leim Fabry, MD  [facility] Providers Cardiologist:  [name], MD Cardiology APP:  Lennon Alstrom, PA-C     History of Present Illness: .   Erica Brock is a 72 y.o. female with history of coronary artery disease status post CABG (11/2020; LIMA-LAD, SVG-D1, SVG-OM1, and SVG-PDA), PSVT, hypertension, hyperlipidemia, type 2 diabetes mellitus, obstructive of sleep apnea, obesity, provoked DVT/PE, rheumatoid arthritis, anxiety, and cystic mastopathy, who presents for follow-up of coronary artery disease.  I last saw her in May, at which time she reported a single episode of palpitations that woke her up about a month before our visit.  There were no associated symptoms.  Blood pressure was mildly elevated at her visit, prompting escalation of lisinopril.  Due to excellent lipid control, ezetimibe was stopped with plans to continue high intensity statin monotherapy.  Today, Ms. Erica Brock reports that she has been under quite a bit of stress recently.  She is still in the process of moving to a new home.  Both her husband and son were hospitalized earlier this week.  Her husband had another TIA.  Her son was in the ED with gastroenteritis.  Both seem to be feeling better.  Ms. Erica Brock complains of intermittent lightheadedness and cold sweats, especially when she has been standing for at least 5 minutes.  Symptoms began in May and have persisted.  She has not had any chest pain, dyspnea, or palpitations.  She notes occasional mild dependent edema.  She never had chest pain leading up to her NSTEMI and CABG in 2022, presenting after having nausea and a syncopal episode.  She has been compliant with her medications and reports that her blood pressures at home have been 104-128/65-87.  Accuracy of her blood pressure cuff was confirmed in the office today.  ROS: See HPI  Studies  Reviewed: Marland Kitchen   EKG Interpretation Date/Time:  Wednesday May 13 2023 11:02:12 EDT Ventricular Rate:  69 PR Interval:  176 QRS Duration:  94 QT Interval:  412 QTC Calculation: 441 R Axis:   -42  Text Interpretation: Normal sinus rhythm Left axis deviation Cannot rule out Anterior infarct (cited on or before 13-May-2023) When compared with ECG of 13-May-2023 10:59, No significant change was found Confirmed by Sundi Slevin, Cristal Deer [phone]) on 05/13/2023 2:24:19 PM    Risk Assessment/Calculations:     HYPERTENSION CONTROL Vitals:   05/13/23 1054 05/13/23 1115  BP: (!) 144/100 (!) 180/100    The patient's blood pressure is elevated above target today.  In order to address the patient's elevated BP: Blood pressure will be monitored at home to determine if medication changes need to be made.; The blood pressure is usually elevated in clinic.  Blood pressures monitored at home have been optimal.       Physical Exam:   VS:  BP (!) 180/100   Pulse 69   Ht 5\' 4"  (1.626 m)   Wt 166 lb (75.3 kg)   SpO2 97%   BMI 28.49 kg/m    Wt Readings from Last 3 Encounters:  05/13/23 166 lb (75.3 kg)  11/19/22 173 lb 12.8 oz (78.8 kg)  05/21/22 178 lb (80.7 kg)    Position Blood pressure (mmHg) Heart rate (bpm)  Lying 168/105 69  Sitting 173/106 71  Standing 172/122 82  Standing (3 minutes) 172/118 83   General:  NAD.  Accompanied by  her daughter-in-law. Neck: No JVD or HJR. Lungs: Clear to auscultation bilaterally without wheezes or crackles. Heart: Regular rate and rhythm without murmurs, rubs, or gallops. Abdomen: Soft, nontender, nondistended. Extremities: No lower extremity edema.  ASSESSMENT AND PLAN: .    Lightheadedness and diaphoresis, concerning for anginal equivalents in the setting of coronary artery disease status post CABG: Ms. Erica Brock has several nonspecific symptoms today, but given her history of significant CAD and vague symptoms leading up to her NSTEMI and subsequent CABG,  I am concerned that diaphoresis and lightheadedness could be indicative of worsening coronary insufficiency.  I will check a CBC, CMP, and TSH today to look for potential etiologies of her symptoms.  Given her abnormal EKG and diabetes mellitus, I have recommended that we obtain a pharmacologic myocardial PET/CT stress test.  If we are unable to obtain approval for this, a pharmacologic myocardial SPECT/CT could be done as an alternative, though sensitivity and specificity would be reduced.  In the meantime, we will defer medication changes.  Hypertension: Blood pressure significantly elevated today though home readings are typically all normal.  Accuracy of Ms. Erica Brock's blood pressure cuff was confirmed in the office today.  She likely has an element of whitecoat hypertension and has been under quite a bit of stress recently with her move and recent ED visits/admissions for her husband and son.  We will defer medication changes today.  Hyperlipidemia associated with type 2 diabetes mellitus: Continue rosuvastatin 40 mg daily.    Informed Consent   Shared Decision Making/Informed Consent The risks [chest pain, shortness of breath, cardiac arrhythmias, dizziness, blood pressure fluctuations, myocardial infarction, stroke/transient ischemic attack, nausea, vomiting, allergic reaction, radiation exposure, metallic taste sensation and life-threatening complications (estimated to be 1 in 10,000)], benefits (risk stratification, diagnosing coronary artery disease, treatment guidance) and alternatives of a cardiac PET stress test were discussed in detail with Ms. Erica Brock and she agrees to proceed.     Dispo: Return to clinic in 1 month.  Signed, [name], MD

## 2023-05-14 LAB — CBC
Hematocrit: 39.7 % (ref 34.0–46.6)
Hemoglobin: 13.2 g/dL (ref 11.1–15.9)
MCH: 32.2 pg (ref 26.6–33.0)
MCHC: 33.2 g/dL (ref 31.5–35.7)
MCV: 97 fL (ref 79–97)
Platelets: 243 10*3/uL (ref 150–450)
RBC: 4.1 x10E6/uL (ref 3.77–5.28)
RDW: 12.1 % (ref 11.7–15.4)
WBC: 5.9 10*3/uL (ref 3.4–10.8)

## 2023-05-14 LAB — TSH: TSH: 0.946 u[IU]/mL (ref 0.450–4.500)

## 2023-05-14 LAB — COMPREHENSIVE METABOLIC PANEL
ALT: 23 [IU]/L (ref 0–32)
AST: 29 [IU]/L (ref 0–40)
Albumin: 4.5 g/dL (ref 3.8–4.8)
Alkaline Phosphatase: 60 [IU]/L (ref 44–121)
BUN/Creatinine Ratio: 16 (ref 12–28)
BUN: 10 mg/dL (ref 8–27)
Bilirubin Total: 0.5 mg/dL (ref 0.0–1.2)
CO2: 23 mmol/L (ref 20–29)
Calcium: 9.5 mg/dL (ref 8.7–10.3)
Chloride: 97 mmol/L (ref 96–106)
Creatinine, Ser: 0.64 mg/dL (ref 0.57–1.00)
Globulin, Total: 2.1 g/dL (ref 1.5–4.5)
Glucose: 100 mg/dL — ABNORMAL HIGH (ref 70–99)
Potassium: 5.1 mmol/L (ref 3.5–5.2)
Sodium: 134 mmol/L (ref 134–144)
Total Protein: 6.6 g/dL (ref 6.0–8.5)
eGFR: 93 mL/min/{1.73_m2} (ref >59)

## 2023-05-18 ENCOUNTER — Ambulatory Visit
Admission: RE | Admit: 2023-05-18 | Discharge: 2023-05-18 | Disposition: A | Discharge status: Home / Self Care | Payer: Medicare HMO | Source: Ambulatory Visit | Attending: Family Medicine | Admitting: Family Medicine

## 2023-05-18 DIAGNOSIS — Z1231 Encounter for screening mammogram for malignant neoplasm of breast: Secondary | ICD-10-CM | POA: Insufficient documentation

## 2023-05-26 ENCOUNTER — Encounter (HOSPITAL_COMMUNITY): Payer: Self-pay

## 2023-05-28 ENCOUNTER — Ambulatory Visit: Admission: RE | Admit: 2023-05-28 | Payer: Medicare HMO | Source: Ambulatory Visit

## 2023-06-02 ENCOUNTER — Telehealth (HOSPITAL_COMMUNITY): Payer: Self-pay | Admitting: Emergency Medicine

## 2023-06-02 NOTE — Telephone Encounter (Signed)
Reaching out to patient to offer assistance regarding upcoming cardiac imaging study; pt verbalizes understanding of appt date/time, parking situation and where to check in, pre-test NPO status and medications ordered, and verified current allergies; name and call back number provided for further questions should they arise Cayne Yom RN Navigator Cardiac Imaging [facility] [phone] office [phone] cell 

## 2023-06-04 ENCOUNTER — Ambulatory Visit
Admission: RE | Admit: 2023-06-04 | Discharge: 2023-06-04 | Disposition: A | Discharge status: Home / Self Care | Payer: Medicare HMO | Source: Ambulatory Visit | Attending: Internal Medicine | Admitting: Internal Medicine

## 2023-06-04 DIAGNOSIS — I700 Atherosclerosis of aorta: Secondary | ICD-10-CM | POA: Diagnosis present

## 2023-06-04 DIAGNOSIS — I25119 Atherosclerotic heart disease of native coronary artery with unspecified angina pectoris: Secondary | ICD-10-CM | POA: Insufficient documentation

## 2023-06-04 LAB — NM PET CT CARDIAC PERFUSION MULTI W/ABSOLUTE BLOODFLOW
LV dias vol: 80 mL (ref 46–106)
LV sys vol: 35 mL
MBFR: 3.13
Nuc Rest EF: 53 %
Nuc Stress EF: 56 %
Peak HR: 100 {beats}/min
Rest HR: 68 {beats}/min
Rest MBF: 1.03 ml/g/min
Rest Nuclear Isotope Dose: 18.7 mCi
SRS: 0
SSS: 13
ST Depression (mm): 0 mm
Stress MBF: 3.22 ml/g/min
Stress Nuclear Isotope Dose: 18.8 mCi
TID: 0.98

## 2023-06-04 MED ORDER — REGADENOSON 0.4 MG/5ML IV SOLN
INTRAVENOUS | Status: AC
  Filled 2023-06-04: qty 5

## 2023-06-04 MED ORDER — REGADENOSON 0.4 MG/5ML IV SOLN
0.4000 mg | Freq: Once | INTRAVENOUS | Status: AC
  Administered 2023-06-04: 0.4 mg via INTRAVENOUS
  Filled 2023-06-04: qty 5

## 2023-06-04 MED ORDER — RUBIDIUM RB82 GENERATOR (RUBYFILL)
25.0000 | PACK | Freq: Once | INTRAVENOUS | Status: AC
  Administered 2023-06-04: 18.78 via INTRAVENOUS

## 2023-06-04 MED ORDER — RUBIDIUM RB82 GENERATOR (RUBYFILL)
25.0000 | PACK | Freq: Once | INTRAVENOUS | Status: AC
  Administered 2023-06-04: 18.69 via INTRAVENOUS

## 2023-06-11 ENCOUNTER — Encounter: Payer: Self-pay | Admitting: Internal Medicine

## 2023-06-11 NOTE — Progress Notes (Signed)
Cardiology Clinic Note   Date: 06/12/2023 ID: Erica Brock, DOB Jan 15, 1951, MRN [id number]  Primary Cardiologist:  [name], MD  Patient Profile    Erica Brock is a 73 y.o. female who presents to the clinic today for follow up after testing.     Past medical history significant for: CAD. LHC 12/11/2020 (NSTEMI): Severe three-vessel CAD with heavy calcification, including 40% mid LMCA stenosis, sequential 40% mid and 70 to 80% mid/distal LAD lesions, 50% proximal LCx disease at takeoff of large OM branch followed by sequential 70% and 99% stenoses in OM1, and CTO of dominant RCA with left to right and right to right collaterals.  Mildly elevated LVEDP.  Recommend CTS consult. CABG x 4 12/13/2020: LIMA to LAD, SVG to diagonal, SVG to OM, SVG to PDA. Echo 02/20/2021: EF 50 to 55%.  No RWMA.  Indeterminate diastolic parameters.  Low normal RV function.  Mild RVH.  Mild LAE.  Mild MR.  Mild aortic valve sclerosis without stenosis. PET/CT 05/25/2023: Findings consistent with ischemia in the distribution of LCx.  Study is intermediate risk. Defect 1: There is a medium defect with moderate reduction in uptake present in the mid to basal anterolateral and inferolateral location(s) that is reversible. There is normal wall motion in the defect area. Consistent with ischemia.  PSVT. 14-day ZIO 08/12/2021: HR 46 to 130 bpm, average 65 bpm.  Rare PACs/PVCs.  14 atrial runs lasting up to 19.2 seconds with max rate 156 bpm.  No sustained arrhythmia or prolonged pauses observed.  No patient triggered events. Hypertension. Hyperlipidemia. Lipid panel 12/08/2022: LDL 57, HDL 72, TG 79, total 145. Carotid artery disease. Carotid duplex 07/18/2022: Right carotid without evidence of thrombus, dissection, atherosclerotic plaque, or stenosis.  Left ICA 1 to 39% stenosis. OSA. PE/DVT. 2018 in the setting of surgery. T2DM.  In summary, patient with a history of provoked PE/DVT following surgery in 2018.   She completed a course of anticoagulation.  CAC score 2407 in 2018 with diffuse LM and three-vessel CAC.  2018 MPI ruled low risk though moderate size defect noted and thought to be secondary to artifact and less likely ischemia.  Echo March 2022 showed normal LV function and mild MR.  In May 2022 patient presented to [facility] ED for vomiting and diarrhea.  Troponin peak 329.  EKG showed NSR with LAFB and incomplete RBBB, 84 bpm with no significant ST-T wave changes.  MPI ruled high risk with mid anteroseptal and apical septal defect suggestive of scar and reversible basal and mid anterolateral/inferolateral defect consistent with ischemia.  LHC showed significant three-vessel disease (detailed above) and she was transferred to [facility] for CTS consult.  She underwent CABG x 4.  Echo August 2022 showed low normal LV/RV function (detailed above).  Patient contacted the office December 2022 with complaints of palpitations.  EKG tracings captured on watch were reviewed by Dr. Okey Dupre and outpatient ZIO was recommended which showed 14 atrial runs (detailed above).     History of Present Illness    Erica Brock is followed by Dr. Okey Dupre for the above outlined history.   Patient was last seen in the office by Dr. Okey Dupre on 05/13/2023 for routine follow-up.  She reported increased stress with her husband suffering a TIA and her son in the ED with gastroenteritis.  She complained of intermittent headaches and cold sweats especially after standing for 5 minutes.  She never had chest pain prior to NSTEMI and CABG in 2022 instead presenting  with nausea and a syncopal episode.  Symptoms concerning for anginal equivalent and PET stress was obtained with findings consistent with ischemia in the distribution of LCx.  Discussed the use of AI scribe software for clinical note transcription with the patient, who gave verbal consent to proceed.  The patient presents accompanied by her husband for follow-up after PET CT. She  reports continued episodes of lightheadedness and diaphoresis when standing for a period of time. The episodes are not associated with chest pain but she will feel like her heart is racing and possibly a little winded. These episodes have been going on since May but she attributed them to stress and anxiety over buying a house. She reports she did not have chest pain when she had her heart attack in 2022. Discussed the results of the PET CT and the next step to do a catheterization. She agrees to proceed. All questions were answered.         ROS: All other systems reviewed and are otherwise negative except as noted in History of Present Illness.  Studies Reviewed    EKG Interpretation Date/Time:  Friday June 12 2023 11:43:38 EST Ventricular Rate:  69 PR Interval:  184 QRS Duration:  92 QT Interval:  410 QTC Calculation: 439 R Axis:   -43  Text Interpretation: Normal sinus rhythm Left axis deviation Pulmonary disease pattern Incomplete right bundle branch block When compared with ECG of 13-May-2023 11:02, No significant change was found Confirmed by Erica Brock [phone]) on 06/12/2023 11:49:50 AM   Risk Assessment/Calculations      HYPERTENSION CONTROL Vitals:   06/12/23 1136 06/12/23 1230  BP: (!) 150/88 (!) 142/88    The patient's blood pressure is elevated above target today.  In order to address the patient's elevated BP: The blood pressure is usually elevated in clinic.  Blood pressures monitored at home have been optimal.           Physical Exam    VS:  BP (!) 150/88 (BP Location: Left Arm, Patient Position: Sitting, Cuff Size: Normal)   Pulse 69   Ht 5\' 4"  (1.626 m)   Wt 160 lb (72.6 kg)   SpO2 99%   BMI 27.46 kg/m  , BMI Body mass index is 27.46 kg/m.  GEN: Well nourished, well developed, in no acute distress. Neck: No JVD or carotid bruits. Cardiac:  RRR. No murmurs. No rubs or gallops.   Respiratory:  Respirations regular and unlabored. Clear to  auscultation without rales, wheezing or rhonchi. GI: Soft, nontender, nondistended. Extremities: Radials/DP/PT 2+ and equal bilaterally. No clubbing or cyanosis. No edema.  Skin: Warm and dry, no rash. Neuro: Strength intact.  Assessment & Plan      CAD S/p CABG x 21 Nov 2020.  PET/CT 05/25/2023 with findings consistent with ischemia in the distribution of LCx.  Patient reports continued episodes of lightheadedness and diaphoresis when standing for a period of time. This is concerning for anginal equivalent. Symptoms resolve with rest. Discussed results of PET CT and proceeding with heart cath. She and husband are in agreement. All questions answered.  -Schedule patient for LHC. -BMP and CBC today. -Continue aspirin, carvedilol, rosuvastatin.  Palpitations/PSVT 14-day ZIO January 2023 showed 14 atrial runs lasting up to 19.2 seconds.  Patient reports she will sometimes feel her heart is racing with the episodes of lightheadedness and diaphoresis. RRR on exam today.  -Continue carvedilol.  Hypertension BP today 150/88 on intake and 142/88 on my recheck. Home BP consistently <130/80.  She reports BP always elevated at the doctor's office.  -Continue carvedilol and lisinopril.  Hyperlipidemia LDL May 2024 57, at goal. Continue rosuvastatin.         Disposition: CBC and BMP today. LHC. Return 2 weeks after cath or sooner as needed.      Informed Consent   Shared Decision Making/Informed Consent The risks [stroke (1 in 1000), death (1 in 1000), kidney failure [usually temporary] (1 in 500), bleeding (1 in 200), allergic reaction [possibly serious] (1 in 200)], benefits (diagnostic support and management of coronary artery disease) and alternatives of a cardiac catheterization were discussed in detail with Ms. Zheng and she is willing to proceed.      Signed, Etta Grandchild. Clemie General, DNP, NP-C

## 2023-06-11 NOTE — H&P (View-Only) (Signed)
 Cardiology Clinic Note   Date: 06/12/2023 ID: [name], DOB Jan 15, 1951, MRN [id number]  Primary Cardiologist:  [name], MD  Patient Profile    Erica Brock is a 73 y.o. female who presents to the clinic today for follow up after testing.     Past medical history significant for: CAD. LHC 12/11/2020 (NSTEMI): Severe three-vessel CAD with heavy calcification, including 40% mid LMCA stenosis, sequential 40% mid and 70 to 80% mid/distal LAD lesions, 50% proximal LCx disease at takeoff of large OM branch followed by sequential 70% and 99% stenoses in OM1, and CTO of dominant RCA with left to right and right to right collaterals.  Mildly elevated LVEDP.  Recommend CTS consult. CABG x 4 12/13/2020: LIMA to LAD, SVG to diagonal, SVG to OM, SVG to PDA. Echo 02/20/2021: EF 50 to 55%.  No RWMA.  Indeterminate diastolic parameters.  Low normal RV function.  Mild RVH.  Mild LAE.  Mild MR.  Mild aortic valve sclerosis without stenosis. PET/CT 05/25/2023: Findings consistent with ischemia in the distribution of LCx.  Study is intermediate risk. Defect 1: There is a medium defect with moderate reduction in uptake present in the mid to basal anterolateral and inferolateral location(s) that is reversible. There is normal wall motion in the defect area. Consistent with ischemia.  PSVT. 14-day ZIO 08/12/2021: HR 46 to 130 bpm, average 65 bpm.  Rare PACs/PVCs.  14 atrial runs lasting up to 19.2 seconds with max rate 156 bpm.  No sustained arrhythmia or prolonged pauses observed.  No patient triggered events. Hypertension. Hyperlipidemia. Lipid panel 12/08/2022: LDL 57, HDL 72, TG 79, total 145. Carotid artery disease. Carotid duplex 07/18/2022: Right carotid without evidence of thrombus, dissection, atherosclerotic plaque, or stenosis.  Left ICA 1 to 39% stenosis. OSA. PE/DVT. 2018 in the setting of surgery. T2DM.  In summary, patient with a history of provoked PE/DVT following surgery in 2018.   She completed a course of anticoagulation.  CAC score 2407 in 2018 with diffuse LM and three-vessel CAC.  2018 MPI ruled low risk though moderate size defect noted and thought to be secondary to artifact and less likely ischemia.  Echo March 2022 showed normal LV function and mild MR.  In May 2022 patient presented to [facility] ED for vomiting and diarrhea.  Troponin peak 329.  EKG showed NSR with LAFB and incomplete RBBB, 84 bpm with no significant ST-T wave changes.  MPI ruled high risk with mid anteroseptal and apical septal defect suggestive of scar and reversible basal and mid anterolateral/inferolateral defect consistent with ischemia.  LHC showed significant three-vessel disease (detailed above) and she was transferred to [facility] for CTS consult.  She underwent CABG x 4.  Echo August 2022 showed low normal LV/RV function (detailed above).  Patient contacted the office December 2022 with complaints of palpitations.  EKG tracings captured on watch were reviewed by Dr. Okey Dupre and outpatient ZIO was recommended which showed 14 atrial runs (detailed above).     History of Present Illness    Erica Brock is followed by Dr. Okey Dupre for the above outlined history.   Patient was last seen in the office by Dr. Okey Dupre on 05/13/2023 for routine follow-up.  She reported increased stress with her husband suffering a TIA and her son in the ED with gastroenteritis.  She complained of intermittent headaches and cold sweats especially after standing for 5 minutes.  She never had chest pain prior to NSTEMI and CABG in 2022 instead presenting  with nausea and a syncopal episode.  Symptoms concerning for anginal equivalent and PET stress was obtained with findings consistent with ischemia in the distribution of LCx.  Discussed the use of AI scribe software for clinical note transcription with the patient, who gave verbal consent to proceed.  The patient presents accompanied by her husband for follow-up after PET CT. She  reports continued episodes of lightheadedness and diaphoresis when standing for a period of time. The episodes are not associated with chest pain but she will feel like her heart is racing and possibly a little winded. These episodes have been going on since May but she attributed them to stress and anxiety over buying a house. She reports she did not have chest pain when she had her heart attack in 2022. Discussed the results of the PET CT and the next step to do a catheterization. She agrees to proceed. All questions were answered.         ROS: All other systems reviewed and are otherwise negative except as noted in History of Present Illness.  Studies Reviewed    EKG Interpretation Date/Time:  Friday June 12 2023 11:43:38 EST Ventricular Rate:  69 PR Interval:  184 QRS Duration:  92 QT Interval:  410 QTC Calculation: 439 R Axis:   -43  Text Interpretation: Normal sinus rhythm Left axis deviation Pulmonary disease pattern Incomplete right bundle branch block When compared with ECG of 13-May-2023 11:02, No significant change was found Confirmed by Carlos Levering [phone]) on 06/12/2023 11:49:50 AM   Risk Assessment/Calculations      HYPERTENSION CONTROL Vitals:   06/12/23 1136 06/12/23 1230  BP: (!) 150/88 (!) 142/88    The patient's blood pressure is elevated above target today.  In order to address the patient's elevated BP: The blood pressure is usually elevated in clinic.  Blood pressures monitored at home have been optimal.           Physical Exam    VS:  BP (!) 150/88 (BP Location: Left Arm, Patient Position: Sitting, Cuff Size: Normal)   Pulse 69   Ht 5\' 4"  (1.626 m)   Wt 160 lb (72.6 kg)   SpO2 99%   BMI 27.46 kg/m  , BMI Body mass index is 27.46 kg/m.  GEN: Well nourished, well developed, in no acute distress. Neck: No JVD or carotid bruits. Cardiac:  RRR. No murmurs. No rubs or gallops.   Respiratory:  Respirations regular and unlabored. Clear to  auscultation without rales, wheezing or rhonchi. GI: Soft, nontender, nondistended. Extremities: Radials/DP/PT 2+ and equal bilaterally. No clubbing or cyanosis. No edema.  Skin: Warm and dry, no rash. Neuro: Strength intact.  Assessment & Plan      CAD S/p CABG x 21 Nov 2020.  PET/CT 05/25/2023 with findings consistent with ischemia in the distribution of LCx.  Patient reports continued episodes of lightheadedness and diaphoresis when standing for a period of time. This is concerning for anginal equivalent. Symptoms resolve with rest. Discussed results of PET CT and proceeding with heart cath. She and husband are in agreement. All questions answered.  -Schedule patient for LHC. -BMP and CBC today. -Continue aspirin, carvedilol, rosuvastatin.  Palpitations/PSVT 14-day ZIO January 2023 showed 14 atrial runs lasting up to 19.2 seconds.  Patient reports she will sometimes feel her heart is racing with the episodes of lightheadedness and diaphoresis. RRR on exam today.  -Continue carvedilol.  Hypertension BP today 150/88 on intake and 142/88 on my recheck. Home BP consistently <130/80.  She reports BP always elevated at the doctor's office.  -Continue carvedilol and lisinopril.  Hyperlipidemia LDL May 2024 57, at goal. Continue rosuvastatin.         Disposition: CBC and BMP today. LHC. Return 2 weeks after cath or sooner as needed.      Informed Consent   Shared Decision Making/Informed Consent The risks [stroke (1 in 1000), death (1 in 1000), kidney failure [usually temporary] (1 in 500), bleeding (1 in 200), allergic reaction [possibly serious] (1 in 200)], benefits (diagnostic support and management of coronary artery disease) and alternatives of a cardiac catheterization were discussed in detail with Ms. Zheng and she is willing to proceed.      Signed, Etta Grandchild. Clemie General, DNP, NP-C

## 2023-06-12 ENCOUNTER — Ambulatory Visit: Payer: Medicare HMO | Attending: Student | Admitting: Student

## 2023-06-12 ENCOUNTER — Encounter: Payer: Self-pay | Admitting: Student

## 2023-06-12 VITALS — BP 142/88 | HR 69 | Ht 64.0 in | Wt 160.0 lb

## 2023-06-12 DIAGNOSIS — I25119 Atherosclerotic heart disease of native coronary artery with unspecified angina pectoris: Secondary | ICD-10-CM | POA: Diagnosis not present

## 2023-06-12 DIAGNOSIS — R002 Palpitations: Secondary | ICD-10-CM | POA: Diagnosis not present

## 2023-06-12 DIAGNOSIS — I471 Supraventricular tachycardia, unspecified: Secondary | ICD-10-CM

## 2023-06-12 DIAGNOSIS — I10 Essential (primary) hypertension: Secondary | ICD-10-CM

## 2023-06-12 DIAGNOSIS — E785 Hyperlipidemia, unspecified: Secondary | ICD-10-CM

## 2023-06-12 NOTE — Patient Instructions (Signed)
Medication Instructions:  No changes at this time.   *If you need a refill on your cardiac medications before your next appointment, please call your pharmacy*   Lab Work: CBC & BMET today   If you have labs (blood work) drawn today and your tests are completely normal, you will receive your results only by: MyChart Message (if you have MyChart) OR A paper copy in the mail If you have any lab test that is abnormal or we need to change your treatment, we will call you to review the results.   Testing/Procedures:  [facility] [place] [address] [place] [address] Dept: [phone] Loc: [phone]  NARIYAH PHILOGENE  06/12/2023  You are scheduled for a Cardiac Catheterization on Wednesday, December 4 with Dr. Cristal Deer End.  1. Please arrive at the [facility]ascular Center Entrance of [facility], [address], [place] [address] at 8:30 AM (This is 1 hour(s) prior to your procedure time).  Proceed to the Check-In Desk directly inside the entrance.  Procedure Parking: Use the entrance off of the [address] side of the hospital. Turn right upon entering and follow the driveway to parking that is directly in front of the Heart & Vascular Center. There is no valet parking available at this entrance, however there is an awning directly in front of the Heart & Vascular Center for drop off/ pick up for patients.  Special note: Every effort is made to have your procedure done on time. Please understand that emergencies sometimes delay scheduled procedures.  2. Diet: Do not eat solid foods after midnight.  The patient may have clear liquids until 5am upon the day of the procedure.  3. Labs: You will need to have blood drawn today   4. Medication instructions in preparation for your procedure:   Contrast Allergy: No   HOLD Furosemide (Lasix) the morning of your procedure.    On the morning of your procedure, take your Aspirin 81 mg and any morning medicines NOT listed above.  You may use sips of water.  5. Plan to go home the same day, you will only stay overnight if medically necessary. 6. Bring a current list of your medications and current insurance cards. 7. You MUST have a responsible person to drive you home. 8. Someone MUST be with you the first 24 hours after you arrive home or your discharge will be delayed. 9. Please wear clothes that are easy to get on and off and wear slip-on shoes.  Thank you for allowing [place] to care for you!   -- [place] [facility]    Follow-Up: At [facility], you and your health needs are our priority.  As part of our continuing mission to provide you with exceptional heart care, we have created designated Provider Care Teams.  These Care Teams include your primary Cardiologist (physician) and Advanced Practice Providers (APPs -  Physician Assistants and Nurse Practitioners) who all work together to provide you with the care you need, when you need it.   Your next appointment:   2 week(s)  Provider:   [name], MD or Carlos Levering, NP

## 2023-06-13 LAB — CBC
Hematocrit: 40.5 % (ref 34.0–46.6)
Hemoglobin: 13.5 g/dL (ref 11.1–15.9)
MCH: 32.8 pg (ref 26.6–33.0)
MCHC: 33.3 g/dL (ref 31.5–35.7)
MCV: 98 fL — ABNORMAL HIGH (ref 79–97)
Platelets: 247 10*3/uL (ref 150–450)
RBC: 4.12 x10E6/uL (ref 3.77–5.28)
RDW: 12.3 % (ref 11.7–15.4)
WBC: 6 10*3/uL (ref 3.4–10.8)

## 2023-06-13 LAB — BASIC METABOLIC PANEL
BUN/Creatinine Ratio: 19 (ref 12–28)
BUN: 14 mg/dL (ref 8–27)
CO2: 21 mmol/L (ref 20–29)
Calcium: 9.4 mg/dL (ref 8.7–10.3)
Chloride: 100 mmol/L (ref 96–106)
Creatinine, Ser: 0.74 mg/dL (ref 0.57–1.00)
Glucose: 91 mg/dL (ref 70–99)
Potassium: 5.1 mmol/L (ref 3.5–5.2)
Sodium: 136 mmol/L (ref 134–144)
eGFR: 85 mL/min/{1.73_m2} (ref >59)

## 2023-06-15 ENCOUNTER — Telehealth: Payer: Self-pay | Admitting: *Deleted

## 2023-06-15 NOTE — Telephone Encounter (Signed)
Updated instructions sent via My Chart  [facility] [address] [place] [address] Dept: [phone] Loc: [phone]   Erica Brock                      06/12/2023   You are scheduled for a Cardiac Catheterization on Wednesday, December 4 with Dr. Cristal Deer End.   1. Please arrive at the [facility] Entrance of [facility], [address], [place] [address] at 11:00 AM (This is 1.5 hour(s) prior to your procedure time).  Proceed to the Check-In Desk directly inside the entrance.   Procedure Parking: Use the entrance off of the [address] side of the hospital. Turn right upon entering and follow the driveway to parking that is directly in front of the Heart & Vascular Center. There is no valet parking available at this entrance, however there is an awning directly in front of the Heart & Vascular Center for drop off/ pick up for patients.   Special note: Every effort is made to have your procedure done on time. Please understand that emergencies sometimes delay scheduled procedures.   2. Diet: Do not eat solid foods after midnight.  The patient may have clear liquids until 5am upon the day of the procedure.   3. Labs: You will need to have blood drawn today    4. Medication instructions in preparation for your procedure:    Contrast Allergy: No     HOLD Furosemide (Lasix) the morning of your procedure.    On the morning of your procedure, take your Aspirin 81 mg and any morning medicines NOT listed above.  You may use sips of water.   5. Plan to go home the same day, you will only stay overnight if medically necessary. 6. Bring a current list of your medications and current insurance cards. 7. You MUST have a responsible person to drive you home. 8. Someone MUST be with you the first 24 hours after you arrive home or your discharge will be  delayed. 9. Please wear clothes that are easy to get on and off and wear slip-on shoes.   Thank you for allowing [place] to care for you!   -- [place] [facility]

## 2023-06-15 NOTE — Telephone Encounter (Signed)
Spoke with patient and rescheduled her heart cath to 12:30 pm. Advised that I would send updated instructions through My chart. She verbalized understanding of updated information and had no further questions at this time.

## 2023-06-15 NOTE — Telephone Encounter (Signed)
-----   Message from [place] sent at 06/14/2023  4:13 PM EST ----- Regarding: RE: Left heart cath Hi Pam,  Thanks for the heads up.  Unfortunately, this cath is scheduled for a time that I am in clinic.  On 12/4, I can only do caths to start at 7:30 or from 11:30-1:30.  Can you see if Ms. Allport can be switched to one of those times?  If not, can we do it the day before when I am in the hospital and can cath during any available time slot?  Thanks.  Thayer Ohm ----- Message ----- From: Bryna Colander, RN Sent: 06/12/2023   2:25 PM EST To: [name], MD; Carlos Levering, NP; # Subject: Left heart cath                                Precert Left heart cath 06/24/23 09:30 [facility] To be done by Dr. Okey Dupre Ordered by Carlos Levering NP

## 2023-06-16 ENCOUNTER — Encounter: Payer: Self-pay | Admitting: Emergency Medicine

## 2023-06-22 ENCOUNTER — Telehealth: Payer: Self-pay | Admitting: Internal Medicine

## 2023-06-22 NOTE — Telephone Encounter (Signed)
The patient has been made aware and verbalized her understanding.  

## 2023-06-22 NOTE — Telephone Encounter (Signed)
Pt c/o medication issue:  1. Name of Medication:   Fluoxetine, 10 mg, once daily  2. How are you currently taking this medication (dosage and times per day)?   Not taking yet  3. Are you having a reaction (difficulty breathing--STAT)?   4. What is your medication issue?   Patient wants to know if it will be OK for her to take this medication as prescribed by her PCP.

## 2023-06-22 NOTE — Telephone Encounter (Signed)
Returned the call to the patient. She was calling to make sure it was okay to take  Fluoxetine 10 mg once daily from a cardiac standpoint.

## 2023-06-22 NOTE — Telephone Encounter (Signed)
I think it is okay for Erica Brock to take this medication from a heart standpoint.  However, I recommend that she touch base with her rheumatologist to make sure that it won't interact with some of her other medications like hydroxychloroquine and colchicine.  [name], MD [facility]

## 2023-06-24 ENCOUNTER — Encounter
Admission: RE | Disposition: A | Discharge status: Home / Self Care | Payer: Self-pay | Source: Home / Self Care | Attending: Internal Medicine

## 2023-06-24 ENCOUNTER — Encounter: Payer: Self-pay | Admitting: Internal Medicine

## 2023-06-24 ENCOUNTER — Ambulatory Visit
Admission: RE | Admit: 2023-06-24 | Discharge: 2023-06-24 | Disposition: A | Discharge status: Home / Self Care | Payer: Medicare HMO | Attending: Internal Medicine | Admitting: Internal Medicine

## 2023-06-24 DIAGNOSIS — Z86718 Personal history of other venous thrombosis and embolism: Secondary | ICD-10-CM | POA: Diagnosis not present

## 2023-06-24 DIAGNOSIS — I10 Essential (primary) hypertension: Secondary | ICD-10-CM | POA: Insufficient documentation

## 2023-06-24 DIAGNOSIS — E785 Hyperlipidemia, unspecified: Secondary | ICD-10-CM | POA: Diagnosis not present

## 2023-06-24 DIAGNOSIS — R9439 Abnormal result of other cardiovascular function study: Secondary | ICD-10-CM | POA: Diagnosis not present

## 2023-06-24 DIAGNOSIS — I2582 Chronic total occlusion of coronary artery: Secondary | ICD-10-CM | POA: Diagnosis not present

## 2023-06-24 DIAGNOSIS — I25119 Atherosclerotic heart disease of native coronary artery with unspecified angina pectoris: Secondary | ICD-10-CM | POA: Insufficient documentation

## 2023-06-24 DIAGNOSIS — I25810 Atherosclerosis of coronary artery bypass graft(s) without angina pectoris: Secondary | ICD-10-CM | POA: Insufficient documentation

## 2023-06-24 DIAGNOSIS — R002 Palpitations: Secondary | ICD-10-CM | POA: Diagnosis not present

## 2023-06-24 DIAGNOSIS — I2510 Atherosclerotic heart disease of native coronary artery without angina pectoris: Secondary | ICD-10-CM

## 2023-06-24 DIAGNOSIS — I471 Supraventricular tachycardia, unspecified: Secondary | ICD-10-CM | POA: Diagnosis not present

## 2023-06-24 HISTORY — PX: LEFT HEART CATH AND CORS/GRAFTS ANGIOGRAPHY: CATH118250

## 2023-06-24 LAB — GLUCOSE, CAPILLARY
Glucose-Capillary: 107 mg/dL — ABNORMAL HIGH (ref 70–99)
Glucose-Capillary: 101 mg/dL — ABNORMAL HIGH (ref 70–99)

## 2023-06-24 SURGERY — LEFT HEART CATH AND CORS/GRAFTS ANGIOGRAPHY
Anesthesia: Moderate Sedation

## 2023-06-24 MED ORDER — VERAPAMIL HCL 2.5 MG/ML IV SOLN
INTRAVENOUS | Status: AC
Start: 2023-06-24 — End: ?
  Filled 2023-06-24: qty 2

## 2023-06-24 MED ORDER — LIDOCAINE HCL (PF) 1 % IJ SOLN
INTRAMUSCULAR | Status: DC | PRN
  Administered 2023-06-24: 2 mL

## 2023-06-24 MED ORDER — HEPARIN SODIUM (PORCINE) 1000 UNIT/ML IJ SOLN
INTRAMUSCULAR | Status: AC
Start: 2023-06-24 — End: ?
  Filled 2023-06-24: qty 10

## 2023-06-24 MED ORDER — HEPARIN (PORCINE) IN NACL 1000-0.9 UT/500ML-% IV SOLN
INTRAVENOUS | Status: DC | PRN
  Administered 2023-06-24 (×2): 500 mL

## 2023-06-24 MED ORDER — ONDANSETRON HCL 4 MG/2ML IJ SOLN: 4.0000 mg | Freq: Four times a day (QID) | INTRAMUSCULAR | Status: DC | PRN

## 2023-06-24 MED ORDER — AMLODIPINE BESYLATE 2.5 MG PO TABS: 2.5000 mg | ORAL_TABLET | Freq: Every day | ORAL | 5 refills | Status: DC

## 2023-06-24 MED ORDER — MIDAZOLAM HCL 2 MG/2ML IJ SOLN
INTRAMUSCULAR | Status: DC | PRN
  Administered 2023-06-24: 1 mg via INTRAVENOUS

## 2023-06-24 MED ORDER — IOHEXOL 300 MG/ML  SOLN
INTRAMUSCULAR | Status: DC | PRN
  Administered 2023-06-24: 48 mL

## 2023-06-24 MED ORDER — FENTANYL CITRATE (PF) 100 MCG/2ML IJ SOLN
INTRAMUSCULAR | Status: DC | PRN
  Administered 2023-06-24: 25 ug via INTRAVENOUS

## 2023-06-24 MED ORDER — SODIUM CHLORIDE 0.9% FLUSH: 3.0000 mL | INTRAVENOUS | Status: DC | PRN

## 2023-06-24 MED ORDER — SODIUM CHLORIDE 0.9 % IV SOLN: INTRAVENOUS | Status: DC

## 2023-06-24 MED ORDER — SODIUM CHLORIDE 0.9% FLUSH: 3.0000 mL | Freq: Two times a day (BID) | INTRAVENOUS | Status: DC

## 2023-06-24 MED ORDER — SODIUM CHLORIDE 0.9 % IV SOLN: 250.0000 mL | INTRAVENOUS | Status: DC | PRN

## 2023-06-24 MED ORDER — SODIUM CHLORIDE 0.9 % WEIGHT BASED INFUSION
3.0000 mL/kg/h | INTRAVENOUS | Status: AC
  Administered 2023-06-24: 3 mL/kg/h via INTRAVENOUS

## 2023-06-24 MED ORDER — HEPARIN SODIUM (PORCINE) 1000 UNIT/ML IJ SOLN
INTRAMUSCULAR | Status: DC | PRN
  Administered 2023-06-24: 3500 [IU] via INTRAVENOUS

## 2023-06-24 MED ORDER — MIDAZOLAM HCL 2 MG/2ML IJ SOLN
INTRAMUSCULAR | Status: AC
Start: 2023-06-24 — End: ?
  Filled 2023-06-24: qty 2

## 2023-06-24 MED ORDER — VERAPAMIL HCL 2.5 MG/ML IV SOLN
INTRAVENOUS | Status: DC | PRN
  Administered 2023-06-24 (×2): 2.5 mg via INTRAVENOUS

## 2023-06-24 MED ORDER — ACETAMINOPHEN 325 MG PO TABS
650.0000 mg | ORAL_TABLET | ORAL | Status: DC | PRN
Start: 2023-06-24 — End: 2023-06-24

## 2023-06-24 MED ORDER — SODIUM CHLORIDE 0.9 % WEIGHT BASED INFUSION: 1.0000 mL/kg/h | INTRAVENOUS | Status: DC

## 2023-06-24 MED ORDER — LABETALOL HCL 5 MG/ML IV SOLN: 10.0000 mg | INTRAVENOUS | Status: DC | PRN

## 2023-06-24 MED ORDER — ASPIRIN 81 MG PO CHEW: 81.0000 mg | CHEWABLE_TABLET | ORAL | Status: DC

## 2023-06-24 MED ORDER — HYDRALAZINE HCL 20 MG/ML IJ SOLN: 10.0000 mg | INTRAMUSCULAR | Status: DC | PRN

## 2023-06-24 MED ORDER — FENTANYL CITRATE (PF) 100 MCG/2ML IJ SOLN
INTRAMUSCULAR | Status: AC
  Filled 2023-06-24: qty 2

## 2023-06-24 MED ORDER — HEPARIN (PORCINE) IN NACL 1000-0.9 UT/500ML-% IV SOLN
INTRAVENOUS | Status: AC
  Filled 2023-06-24: qty 1000

## 2023-06-24 SURGICAL SUPPLY — 12 items
CATH INFINITI 5 FR IM (CATHETERS) IMPLANT
CATH INFINITI 5 FR MPA2 (CATHETERS) IMPLANT
CATH INFINITI 5FR AL1 (CATHETERS) IMPLANT
DEVICE RAD TR BAND REGULAR (VASCULAR PRODUCTS) IMPLANT
DRAPE BRACHIAL (DRAPES) IMPLANT
GLIDESHEATH SLEND SS 6F .021 (SHEATH) IMPLANT
GUIDEWIRE INQWIRE 1.5J.035X260 (WIRE) IMPLANT
INQWIRE 1.5J .035X260CM (WIRE) ×1
PACK CARDIAC CATH (CUSTOM PROCEDURE TRAY) ×1 IMPLANT
PROTECTION STATION PRESSURIZED (MISCELLANEOUS) ×1
SET ATX-X65L (MISCELLANEOUS) IMPLANT
STATION PROTECTION PRESSURIZED (MISCELLANEOUS) IMPLANT

## 2023-06-24 NOTE — Progress Notes (Addendum)
Dr. Okey Dupre at bedside, speaking with pt., her spouse and son re: cath results. All parties involved verbalize understanding of conversation. MD aware of pt. Elevated BP. Pt. States "my BP is always high at the office & whenever I am in the hospital. When I get home, it is o.k. I have to take my BP home readings into the office to prove they are o.k." MD gave pt. RX for norvasc to start today upon DC home.

## 2023-06-24 NOTE — Interval H&P Note (Signed)
History and Physical Interval Note:  06/24/2023 12:42 PM  Erica Brock  has presented today for surgery, with the diagnosis of L Cath    CAD   Abnormal PET.  The various methods of treatment have been discussed with the patient and family. After consideration of risks, benefits and other options for treatment, the patient has consented to  Procedure(s): LEFT HEART CATH AND CORS/GRAFTS ANGIOGRAPHY (N/A) as a surgical intervention.  The patient's history has been reviewed, patient examined, no change in status, stable for surgery.  I have reviewed the patient's chart and labs.  Questions were answered to the patient's satisfaction.    Cath Lab Visit (complete for each Cath Lab visit)  Clinical Evaluation Leading to the Procedure:   ACS: No.  Non-ACS:    Anginal Classification: CCS III  Anti-ischemic medical therapy: Minimal Therapy (1 class of medications)  Non-Invasive Test Results: Intermediate-risk stress test findings: cardiac mortality 1-3%/year  Prior CABG: Previous CABG  Clotee Schlicker

## 2023-06-24 NOTE — Progress Notes (Signed)
BP remains elevated, although pt. Totalliy asymptomatic. Pt. & spouse state they will pick up norvasc RX on the way home & take it upon arrival home for BP. Left wrist remains without any complications at site.

## 2023-06-25 ENCOUNTER — Encounter: Payer: Self-pay | Admitting: Internal Medicine

## 2023-07-05 NOTE — Progress Notes (Unsigned)
Cardiology Clinic Note   Date: 07/09/2023 ID: Erica Brock, DOB [birth year], MRN [id number]  Primary Cardiologist:  [name], MD  Patient Profile    Erica Brock is a 72 y.o. female who presents to the clinic today for follow up after LHC.     Past medical history significant for: CAD. LHC 12/11/2020 (NSTEMI): Severe three-vessel CAD with heavy calcification, including 40% mid LMCA stenosis, sequential 40% mid and 70 to 80% mid/distal LAD lesions, 50% proximal LCx disease at takeoff of large OM branch followed by sequential 70% and 99% stenoses in OM1, and CTO of dominant RCA with left to right and right to right collaterals.  Mildly elevated LVEDP.  Recommend CTS consult. CABG x 4 12/13/2020: LIMA to LAD, SVG to diagonal, SVG to OM, SVG to PDA. Echo 02/20/2021: EF 50 to 55%.  No RWMA.  Indeterminate diastolic parameters.  Low normal RV function.  Mild RVH.  Mild LAE.  Mild MR.  Mild aortic valve sclerosis without stenosis. PET/CT 05/25/2023: Findings consistent with ischemia in the distribution of LCx.  Study is intermediate risk. Defect 1: There is a medium defect with moderate reduction in uptake present in the mid to basal anterolateral and inferolateral location(s) that is reversible. There is normal wall motion in the defect area. Consistent with ischemia. LHC 06/24/2023 (abnormal stress test): Severe native CAD including multifocal LAD disease of up to 70 to 80% in the distal vessel, 50% proximal D2 disease, CTO of OM1 and CTO of mid RCA.  Patent LIMA to LAD, SVG to OM1, SVG to RPDA with 30% proximal stenosis.  Occluded SVG to D2.  Recommend escalating medical therapy for improved blood pressure control and antianginal therapy.  Patient was started on amlodipine 2.5 mg daily. PSVT. 14-day ZIO 08/12/2021: HR 46 to 130 bpm, average 65 bpm.  Rare PACs/PVCs.  14 atrial runs lasting up to 19.2 seconds with max rate 156 bpm.  No sustained arrhythmia or prolonged pauses observed.  No patient  triggered events. Hypertension. Hyperlipidemia. Lipid panel 12/08/2022: LDL 57, HDL 72, TG 79, total 145. Carotid artery disease. Carotid duplex 07/18/2022: Right carotid without evidence of thrombus, dissection, atherosclerotic plaque, or stenosis.  Left ICA 1 to 39% stenosis. OSA. PE/DVT. 2018 in the setting of surgery. T2DM.  In summary, patient with a history of provoked PE/DVT following surgery in 2018.  She completed a course of anticoagulation.  CAC score 2407 in 2018 with diffuse LM and three-vessel CAC.  2018 MPI ruled low risk though moderate size defect noted and thought to be secondary to artifact and less likely ischemia.  Echo March 2022 showed normal LV function and mild MR.  In May 2022 patient presented to [facility] ED for vomiting and diarrhea.  Troponin peak 329.  EKG showed NSR with LAFB and incomplete RBBB, 84 bpm with no significant ST-T wave changes.  MPI ruled high risk with mid anteroseptal and apical septal defect suggestive of scar and reversible basal and mid anterolateral/inferolateral defect consistent with ischemia.  LHC showed significant three-vessel disease (detailed above) and she was transferred to [facility] for CTS consult.  She underwent CABG x 4.  Echo August 2022 showed low normal LV/RV function (detailed above).  Patient contacted the office December 2022 with complaints of palpitations.  EKG tracings captured on watch were reviewed by Dr. Okey Dupre and outpatient ZIO was recommended which showed 14 atrial runs (detailed above).   Patient was seen in the office by Dr. Okey Dupre in October 2024 with  reports of intermittent headaches and cold sweats particularly after standing for 5 minutes.  Patient never had chest pain prior to cardiac events and symptoms were concerning for anginal equivalent.  She underwent PET stress with findings consistent with ischemia in the distribution of LCx.     History of Present Illness    Erica Brock is followed by Dr. Okey Dupre for the above  outlined history.   Patient was last seen in the office by me on 06/12/2023 to review PET stress results.  She continued to have episodes of lightheadedness and diaphoresis when standing for a long period of time.  She reported increased stress and anxiety related to buying a house.  She underwent LHC which showed severe native CAD and occluded SVG to D2 (details above).  Today, patient is accompanied by her husband. She reports improvement in lightheaded episodes since starting amlodipine. She has been checking BP at home with readings from 100-128/67-81. She has not checked her BP during lightheaded episode. She reports the lightheadedness will resolve quickly with sitting and may or may not return when she goes back to her activities. She has some fatigue. She is still trying to get things unpacked and set up in her new home. She is has no other complaints today.      ROS: All other systems reviewed and are otherwise negative except as noted in History of Present Illness.  Studies Reviewed    EKG is not ordered today.   Physical Exam    VS:  BP 130/76 (BP Location: Left Arm, Patient Position: Sitting, Cuff Size: Normal)   Pulse 72   Ht 5\' 4"  (1.626 m)   Wt 156 lb (70.8 kg)   SpO2 99%   BMI 26.78 kg/m  , BMI Body mass index is 26.78 kg/m.  GEN: Well nourished, well developed, in no acute distress. Neck: No JVD or carotid bruits. Cardiac:  RRR. No murmurs. No rubs or gallops.   Respiratory:  Respirations regular and unlabored. Clear to auscultation without rales, wheezing or rhonchi. GI: Soft, nontender, nondistended. Extremities: Radials/DP/PT 2+ and equal bilaterally. No clubbing or cyanosis. No edema.  Skin: Warm and dry, no rash. Neuro: Strength intact.  Assessment & Plan   CAD S/p CABG x 21 Nov 2020.  PET/CT 05/25/2023 with findings consistent with ischemia in the distribution of LCx.  LHC showed severe native CAD with multifocal LAD disease of up to 70 to 80% of the distal  vessel, 50% proximal D2, CTO of OM1 and RCA, 3/4 grafts patent.  Patient reported continued episodes of lightheadedness that resolve with rest but improving since starting amlodipine.  -Continue aspirin, carvedilol, rosuvastatin, amlodipine. Cannot increase amlodipine secondary to home BP readings (see below).    Palpitations/PSVT 14-day ZIO January 2023 showed 14 atrial runs lasting up to 19.2 seconds.  Patient denies palpitations. RRR on exam today.  -Continue carvedilol.   Hypertension BP today 162/88 on intake and 130/76 on my recheck. Home BP 100-128/67-81. She reports BP always elevated at the doctor's office.  -Continue carvedilol, amlodipine and lisinopril.    Hyperlipidemia LDL May 2024 57, at goal. Continue rosuvastatin.   Disposition: Return in 2-3 months with Dr. Okey Dupre or sooner as needed.          Signed, Etta Grandchild. Wray Goehring, DNP, NP-C

## 2023-07-09 ENCOUNTER — Encounter: Payer: Self-pay | Admitting: Student

## 2023-07-09 ENCOUNTER — Ambulatory Visit: Payer: Medicare HMO | Attending: Student | Admitting: Student

## 2023-07-09 VITALS — BP 130/76 | HR 72 | Ht 64.0 in | Wt 156.0 lb

## 2023-07-09 DIAGNOSIS — E785 Hyperlipidemia, unspecified: Secondary | ICD-10-CM

## 2023-07-09 DIAGNOSIS — I471 Supraventricular tachycardia, unspecified: Secondary | ICD-10-CM

## 2023-07-09 DIAGNOSIS — I10 Essential (primary) hypertension: Secondary | ICD-10-CM

## 2023-07-09 DIAGNOSIS — I25118 Atherosclerotic heart disease of native coronary artery with other forms of angina pectoris: Secondary | ICD-10-CM

## 2023-07-09 DIAGNOSIS — R002 Palpitations: Secondary | ICD-10-CM | POA: Diagnosis not present

## 2023-07-09 NOTE — Patient Instructions (Signed)
Medication Instructions:  - No changes *If you need a refill on your cardiac medications before your next appointment, please call your pharmacy*  Lab Work: - None ordered  Testing/Procedures: - None ordered  Follow-Up: At [facility], you and your health needs are our priority.  As part of our continuing mission to provide you with exceptional heart care, we have created designated Provider Care Teams.  These Care Teams include your primary Cardiologist (physician) and Advanced Practice Providers (APPs -  Physician Assistants and Nurse Practitioners) who all work together to provide you with the care you need, when you need it.  Your next appointment:   2 - 3 month(s)  Provider:   [name], MD

## 2023-07-28 ENCOUNTER — Other Ambulatory Visit: Payer: Self-pay | Admitting: Internal Medicine

## 2023-09-14 ENCOUNTER — Ambulatory Visit: Payer: Medicare HMO | Attending: Internal Medicine | Admitting: Internal Medicine

## 2023-09-14 VITALS — BP 148/90 | Ht 64.0 in | Wt 165.0 lb

## 2023-09-14 DIAGNOSIS — R0989 Other specified symptoms and signs involving the circulatory and respiratory systems: Secondary | ICD-10-CM

## 2023-09-14 DIAGNOSIS — G4730 Sleep apnea, unspecified: Secondary | ICD-10-CM

## 2023-09-14 DIAGNOSIS — E785 Hyperlipidemia, unspecified: Secondary | ICD-10-CM | POA: Diagnosis not present

## 2023-09-14 DIAGNOSIS — G4733 Obstructive sleep apnea (adult) (pediatric): Secondary | ICD-10-CM | POA: Diagnosis not present

## 2023-09-14 DIAGNOSIS — I25118 Atherosclerotic heart disease of native coronary artery with other forms of angina pectoris: Secondary | ICD-10-CM | POA: Diagnosis not present

## 2023-09-14 DIAGNOSIS — I25110 Atherosclerotic heart disease of native coronary artery with unstable angina pectoris: Secondary | ICD-10-CM

## 2023-09-14 NOTE — Progress Notes (Unsigned)
 Cardiology Office Note:  .   Date:  09/16/2023  ID:  Erica Brock, DOB 07/29/1950, MRN [id number] PCP: Leim Fabry, MD  [facility] Providers Cardiologist:  [name], MD Cardiology APP:  Lennon Alstrom, PA-C     History of Present Illness: .   Erica Brock is a 73 y.o. female with history of coronary artery disease status post CABG (11/2020; LIMA-LAD, SVG-D1, SVG-OM1, and SVG-PDA), PSVT, hypertension, hyperlipidemia, type 2 diabetes mellitus, obstructive of sleep apnea, obesity, provoked DVT/PE, rheumatoid arthritis, anxiety, and cystic mastopathy, who presents for follow-up of coronary artery disease.  She was last seen in our office in mid December by Carlos Levering, NP, at which time she reported improvement in previously reported lightheadedness since adding amlodipine.  She continued to have some fatigue but no chest pain or dyspnea.  Preceding catheterization showed severe native CAD with patent LIMA-LAD, SVG-OM1, and SVG-RPDA.  SVG-D2 was noted to be occluded.  LVEDP was normal.  Today, Ms. Loud reports that her spells of fatigue have gotten less frequent and severe but have not completely resolved.  They continue to be associated with spikes in her blood pressure.  Home readings are typically better and well within the normal range.  She also notes some fatigue during the day.  She has not had any chest pain or dyspnea.  She notes that she has not been using her CPAP machine since her CABG in 2022, as she found it challenging to sleep with it on after surgery.  ROS: See HPI  Studies Reviewed: Marland Kitchen   EKG Interpretation Date/Time:  Monday September 14 2023 11:49:23 EST Ventricular Rate:  69 PR Interval:  178 QRS Duration:  88 QT Interval:  416 QTC Calculation: 445 R Axis:   -39  Text Interpretation: Normal sinus rhythm Left axis deviation Pulmonary disease pattern Abnormal ECG When compared with ECG of 12-Jun-2023 11:43, No significant change was found  Confirmed by Kelii Chittum ([id number]) on 09/16/2023 6:09:10 AM    LHC (06/24/2023): Severe native coronary artery disease, as detailed below, including multifocal LAD disease of up to 70-80% in the distal vessel, 50% proximal D2 disease, chronic total occlusion of OM1, and chronic total occlusion of mid RCA. Small but widely patent LIMA-LAD with competitive flow at the distal anastomosis. Widely patent SVG-OM1. Patent SVG-RPDA with 30% proximal stenosis in the region of a valve. Occluded SVG-D2. Normal left ventricular filling pressure (LVEDP 10 mmHg).  Myocardial PET/CT (06/04/2023): Intermediate risk study with ischemia in the LCx distribution.  Normal myocardial blood flow reserve.  Risk Assessment/Calculations:        Physical Exam:   VS:  BP (!) 148/90 (BP Location: Left Arm)   Ht 5\' 4"  (1.626 m)   Wt 165 lb (74.8 kg)   SpO2 99%   BMI 28.32 kg/m    Wt Readings from Last 3 Encounters:  09/14/23 165 lb (74.8 kg)  07/09/23 156 lb (70.8 kg)  06/24/23 156 lb 3.2 oz (70.9 kg)    General:  NAD. Neck: No JVD or HJR. Lungs: Clear to auscultation bilaterally without wheezes or crackles. Heart: Regular rate and rhythm without murmurs, rubs, or gallops. Abdomen: Soft, nontender, nondistended. Extremities: No lower extremity edema.  ASSESSMENT AND PLAN: .    Coronary artery disease with stable angina: No frank angina reported.  Sporadic fatigue is nonspecific and most likely not coronary in nature given reassuring catheterization a couple of months ago.  I recommend continuing current secondary prevention regimen consisting of  aspirin, and rosuvastatin as well as antianginal therapy with amlodipine and carvedilol.  Labile hypertension and obstructive sleep apnea: Blood pressure moderately elevated with orthostatic rise today.  Home blood pressure readings are typically well within the normal range, sometimes even bordering on low.  I am therefore reluctant to escalate her current  regimen of amlodipine 2.5 mg daily, carvedilol 6.25 mg twice daily, and lisinopril 20 mg daily.  Given fatigue and labile blood pressures in the setting of previously diagnosed obstructive sleep apnea and lack of CPAP use for at least 2 years, I will refer [name] to pulmonology for further sleep evaluation (she no longer has her CPAP machine).  Hyperlipidemia: Continue rosuvastatin 40 mg daily.     Dispo: Return to clinic in 6 months.  Signed, [name], MD

## 2023-09-14 NOTE — Patient Instructions (Signed)
 Medication Instructions:  Your physician recommends that you continue on your current medications as directed. Please refer to the Current Medication list given to you today.   *If you need a refill on your cardiac medications before your next appointment, please call your pharmacy*   Lab Work: No labs ordered today    Testing/Procedures: No test ordered today    Follow-Up: At [facility], you and your health needs are our priority.  As part of our continuing mission to provide you with exceptional heart care, we have created designated Provider Care Teams.  These Care Teams include your primary Cardiologist (physician) and Advanced Practice Providers (APPs -  Physician Assistants and Nurse Practitioners) who all work together to provide you with the care you need, when you need it.  We recommend signing up for the patient portal called "MyChart".  Sign up information is provided on this After Visit Summary.  MyChart is used to connect with patients for Virtual Visits (Telemedicine).  Patients are able to view lab/test results, encounter notes, upcoming appointments, etc.  Non-urgent messages can be sent to your provider as well.   To learn more about what you can do with MyChart, go to [email or web address].    Your next appointment:   6 month(s)  Provider:   You may see [name], MD or one of the following Advanced Practice Providers on your designated Care Team:   Nicolasa Ducking, NP Eula Listen, PA-C Cadence Fransico Michael, PA-C Charlsie Quest, NP Carlos Levering, NP

## 2023-09-16 ENCOUNTER — Encounter: Payer: Self-pay | Admitting: Internal Medicine

## 2023-09-16 DIAGNOSIS — R0989 Other specified symptoms and signs involving the circulatory and respiratory systems: Secondary | ICD-10-CM | POA: Insufficient documentation

## 2023-09-28 ENCOUNTER — Ambulatory Visit: Payer: Medicare HMO | Admitting: Internal Medicine

## 2023-09-28 ENCOUNTER — Encounter: Payer: Self-pay | Admitting: Internal Medicine

## 2023-09-28 VITALS — BP 140/86 | HR 75 | Temp 97.7°F | Ht 64.0 in | Wt 163.8 lb

## 2023-09-28 DIAGNOSIS — G4733 Obstructive sleep apnea (adult) (pediatric): Secondary | ICD-10-CM | POA: Diagnosis not present

## 2023-09-28 NOTE — Progress Notes (Unsigned)
 Name: Erica Brock MRN: [id number] DOB: 06/15/1951    CHIEF COMPLAINT:  EXCESSIVE DAYTIME SLEEPINESS   HISTORY OF PRESENT ILLNESS: Patient is seen today for problems and issues with sleep related to excessive daytime sleepiness Patient  has been having sleep problems for many years Patient has been having excessive daytime sleepiness for a long time Patient has been having extreme fatigue and tiredness, lack of energy +  very Loud snoring every night + struggling breathe at night and gasps for air + morning headaches + Nonrefreshing sleep  Discussed sleep data and reviewed with patient.  Encouraged proper weight management.  Discussed driving precautions and its relationship with hypersomnolence.  Discussed operating dangerous equipment and its relationship with hypersomnolence.  Discussed sleep hygiene, and benefits of a fixed sleep waked time.  The importance of getting eight or more hours of sleep discussed with patient.  Discussed limiting the use of the computer and television before bedtime.  Decrease naps during the day, so night time sleep will become enhanced.  Limit caffeine, and sleep deprivation.  HTN, stroke, and heart failure are potential risk factors.    EPWORTH SLEEP SCORE 6  No exacerbation at this time No evidence of heart failure at this time No evidence or signs of infection at this time No respiratory distress No fevers, chills, nausea, vomiting, diarrhea No evidence of lower extremity edema No evidence hemoptysis   PAST MEDICAL HISTORY :   has a past medical history of Anxiety, Arthritis, Diabetes mellitus without complication (HCC), Diffuse cystic mastopathy (2012), GERD (gastroesophageal reflux disease), Heart murmur, Hepatitis, HLD (hyperlipidemia), HTN (hypertension), Obesity, OSA (obstructive sleep apnea), and Rheumatic fever.  has a past surgical history that includes Tonsillectomy; reconstruction foot and bunion surgery - both feet; carpael  tunnel release; arthroscopic surgery; Colonoscopy (2008); Total abdominal hysterectomy (1994); Foot surgery (Left); Breast biopsy (Left); LEFT HEART CATH AND CORONARY ANGIOGRAPHY (N/A, 12/11/2020); Coronary artery bypass graft (N/A, 12/13/2020); TEE without cardioversion (N/A, 12/13/2020); Cardiac catheterization; and LEFT HEART CATH AND CORS/GRAFTS ANGIOGRAPHY (N/A, 06/24/2023). Prior to Admission medications   Medication Sig Start Date End Date Taking? Authorizing Provider  acetaminophen (TYLENOL) 500 MG tablet Take 500-1,000 mg by mouth every 6 (six) hours as needed (pain.).   Yes [provider]  amLODipine (NORVASC) 2.5 MG tablet Take 1 tablet (2.5 mg total) by mouth daily. 06/24/23 06/23/24 Yes End, Cristal Deer, MD  aspirin 81 MG tablet Take 1 tablet (81 mg total) by mouth daily. 12/18/20  Yes Joycelyn Man, Donielle M, PA-C  carvedilol (COREG) 6.25 MG tablet TAKE 1 TABLET TWICE A DAY 07/28/23  Yes End, Cristal Deer, MD  colchicine 0.6 MG tablet Take 0.6 mg by mouth 2 (two) times daily as needed (gout).   Yes [provider]  furosemide (LASIX) 20 MG tablet Take 1 tablet (20 mg total) by mouth daily as needed (weight gain 3 lbs overnight or 5 lbs in one week). 01/01/21  Yes Visser, Jacquelyn D, PA-C  hydroxychloroquine (PLAQUENIL) 200 MG tablet Take 200 mg by mouth 2 (two) times daily. 11/01/20  Yes [provider]  lisinopril (ZESTRIL) 20 MG tablet Take 1 tablet (20 mg total) by mouth 2 (two) times daily. . 11/19/22  Yes End, Cristal Deer, MD  montelukast (SINGULAIR) 10 MG tablet Take 10 mg by mouth daily as needed (allergies).   Yes [provider]  nitroGLYCERIN (NITROSTAT) 0.4 MG SL tablet Place under the tongue. 06/22/23 06/21/24 Yes [provider]  potassium chloride (KLOR-CON M10) 10 MEQ tablet TAKE 1  TABLET (10 MEQ TOTAL) BY MOUTH DAILY AS NEEDED (TAKE ONLY WHEN YOU TAKE LASIX). 10/02/22  Yes End, Cristal Deer, MD  rosuvastatin (CRESTOR) 40 MG tablet Take 1  tablet (40 mg total) by mouth daily. Patient taking differently: Take 40 mg by mouth every evening. 11/09/18  Yes Gollan, Tollie Pizza, MD   Allergies  Allergen Reactions   Prednisone Other (See Comments)    Elevated BP   Atorvastatin     Muscle aches     FAMILY HISTORY:  family history includes Aneurysm in her brother; Diabetes in her father; Heart attack in her sister; Hyperlipidemia in her mother and sister; Hypertension in her father and sister; Multiple sclerosis in her mother; Other in her father. SOCIAL HISTORY:  reports that she has never smoked. She has never used smokeless tobacco. She reports current alcohol use. She reports that she does not use drugs.   Review of Systems:  Gen:  Denies  fever, sweats, chills weight loss  HEENT: Denies blurred vision, double vision, ear pain, eye pain, hearing loss, nose bleeds, sore throat Cardiac:  No dizziness, chest pain or heaviness, chest tightness,edema, No JVD Resp:   No cough, -sputum production, -shortness of breath,-wheezing, -hemoptysis,  Gi: Denies swallowing difficulty, stomach pain, nausea or vomiting, diarrhea, constipation, bowel incontinence Gu:  Denies bladder incontinence, burning urine Ext:   Denies Joint pain, stiffness or swelling Skin: Denies  skin rash, easy bruising or bleeding or hives Endoc:  Denies polyuria, polydipsia , polyphagia or weight change Psych:   Denies depression, insomnia or hallucinations  Other:  All other systems negative   ALL OTHER ROS ARE NEGATIVE   BP (!) 140/86 (BP Location: Right Arm, Patient Position: Sitting, Cuff Size: Normal)   Pulse 75   Temp 97.7 F (36.5 C) (Temporal)   Ht 5\' 4"  (1.626 m)   Wt 163 lb 12.8 oz (74.3 kg)   SpO2 98%   BMI 28.12 kg/m    Physical Examination:   General Appearance: No distress  EYES PERRLA, EOM intact.   NECK Supple, No JVD ORAL CAVITY MALLAMPATI 4 Pulmonary: normal breath sounds, No wheezing.  CardiovascularNormal S1,S2.  No m/r/g.    Abdomen: Benign, Soft, non-tender. Skin:   warm, no rashes, no ecchymosis  Extremities: normal, no cyanosis, clubbing. Neuro:without focal findings,  speech normal  PSYCHIATRIC: Mood, affect within normal limits.   ALL OTHER ROS ARE NEGATIVE    ASSESSMENT AND PLAN SYNOPSIS  73 year old patient with signs and symptoms of excessive daytime sleepiness with probable underlying diagnosis of obstructive sleep apnea    Recommend Sleep Study for definitve diagnosis Home sleep study ordered   Deconditioned state -Recommend increased daily activity and exercise   MEDICATION ADJUSTMENTS/LABS AND TESTS ORDERED: Recommend Sleep Study Avoid Allergens and Irritants Avoid secondhand smoke Avoid SICK contacts Recommend  Masking  when appropriate Recommend Keep up-to-date with vaccinations    CURRENT MEDICATIONS REVIEWED AT LENGTH WITH PATIENT TODAY   Patient  satisfied with Plan of action and management. All questions answered  Follow up  3 months   I spent a total of  48 minutes reviewing chart data, face-to-face evaluation with the patient, counseling and coordination of care as detailed above.    Lucie Leather, M.D.  [facility]  Medical Director [facility] Medical Director [facility]

## 2023-09-28 NOTE — Patient Instructions (Signed)
Recommend Home Sleep Study to assess for sleep apnea   

## 2023-10-05 ENCOUNTER — Encounter: Payer: Self-pay | Admitting: Internal Medicine

## 2023-10-05 DIAGNOSIS — G4733 Obstructive sleep apnea (adult) (pediatric): Secondary | ICD-10-CM

## 2023-10-05 NOTE — Telephone Encounter (Signed)
 Please send HST order ASAP. Thank you!

## 2023-10-05 NOTE — Telephone Encounter (Signed)
 Patient was seen on 09/28/23 but HST wasn't ordered until today. The order has now been sent to [facility]

## 2023-10-12 ENCOUNTER — Encounter

## 2023-10-12 DIAGNOSIS — G4733 Obstructive sleep apnea (adult) (pediatric): Secondary | ICD-10-CM

## 2023-10-19 ENCOUNTER — Other Ambulatory Visit: Payer: Self-pay | Admitting: Internal Medicine

## 2023-10-22 DIAGNOSIS — G4733 Obstructive sleep apnea (adult) (pediatric): Secondary | ICD-10-CM | POA: Diagnosis not present

## 2023-10-27 ENCOUNTER — Telehealth: Payer: Self-pay

## 2023-10-27 DIAGNOSIS — G4733 Obstructive sleep apnea (adult) (pediatric): Secondary | ICD-10-CM

## 2023-10-27 NOTE — Telephone Encounter (Signed)
-----   Message from [facility] REDDY sent at 10/27/2023 10:28 AM EDT ----- Regarding: HST results Please notify patient that HST revealed mild OSA, recommend proceeding with APAP therapy set to 4-16 cm H2O with the Airtouch N30i nasal mask. Please also schedule a 3 month follow up visit. Thanks ----- Message ----- From: Lilian Kapur Sent: 10/27/2023   7:36 AM EDT To: Alanda Slim, MD

## 2023-10-27 NOTE — Telephone Encounter (Signed)
 Patient advised. Agreed to DME order. Patient is concerned about cost. Will call back once she knows cost. NFN.

## 2023-11-04 ENCOUNTER — Other Ambulatory Visit: Payer: Self-pay | Admitting: Internal Medicine

## 2023-11-05 ENCOUNTER — Encounter: Payer: Self-pay | Admitting: Internal Medicine

## 2023-11-23 ENCOUNTER — Ambulatory Visit: Admitting: Internal Medicine

## 2023-12-18 ENCOUNTER — Other Ambulatory Visit: Payer: Self-pay | Admitting: Internal Medicine

## 2024-01-14 ENCOUNTER — Other Ambulatory Visit: Payer: Self-pay | Admitting: Internal Medicine

## 2024-03-16 ENCOUNTER — Ambulatory Visit: Admitting: Internal Medicine

## 2024-03-16 NOTE — Progress Notes (Deleted)
  Cardiology Office Note:  .   Date:  03/16/2024  ID:  Erica Brock, DOB 1950-08-16, MRN [id number] PCP: Jyl Railing, MD  [facility] Providers Cardiologist:  Lonni Hanson, MD Cardiology APP:  Orie Ritta BIRCH, PA-C (Inactive) { Click to update primary MD,subspecialty MD or APP then REFRESH:1}    History of Present Illness: .   Erica Brock is a 73 y.o. female with history of coronary artery disease status post CABG (11/2020; LIMA-LAD, SVG-D2, SVG-OM1, and SVG-PDA), PSVT, hypertension, hyperlipidemia, type 2 diabetes mellitus, obstructive of sleep apnea, obesity, provoked DVT/PE, rheumatoid arthritis, anxiety, and cystic mastopathy, who presents for follow-up of coronary artery disease.  I last saw her in February, at which time she reported less frequent fatigue, with preceding catheterization in 06/2023 showing severe native CAD and occluded SVG-D2 but otherwise patent bypass grafts.  We did not make any medication changes or pursue additional testing at our last visit.  Most recent lipid panel through Dr. Quentin office in June showed excellent LDL of 48 and triglycerides of 72.  ROS: See HPI  Studies Reviewed: Erica Brock        LHC (06/24/2023): Severe native coronary artery disease, as detailed below, including multifocal LAD disease of up to 70-80% in the distal vessel, 50% proximal D2 disease, chronic total occlusion of OM1, and chronic total occlusion of mid RCA. Small but widely patent LIMA-LAD with competitive flow at the distal anastomosis. Widely patent SVG-OM1. Patent SVG-RPDA with 30% proximal stenosis in the region of a valve. Occluded SVG-D2. Normal left ventricular filling pressure (LVEDP 10 mmHg).   Myocardial PET/CT (06/04/2023): Intermediate risk study with ischemia in the LCx distribution.  Normal myocardial blood flow reserve.  Risk Assessment/Calculations:   {Does this patient have ATRIAL FIBRILLATION?:[phone]} No BP recorded.  {Refresh Note OR  Click here to enter BP  :1}***       Physical Exam:   VS:  There were no vitals taken for this visit.   Wt Readings from Last 3 Encounters:  09/28/23 163 lb 12.8 oz (74.3 kg)  09/14/23 165 lb (74.8 kg)  07/09/23 156 lb (70.8 kg)    General:  NAD. Neck: No JVD or HJR. Lungs: Clear to auscultation bilaterally without wheezes or crackles. Heart: Regular rate and rhythm without murmurs, rubs, or gallops. Abdomen: Soft, nontender, nondistended. Extremities: No lower extremity edema.  ASSESSMENT AND PLAN: .    ***    {Are you ordering a CV Procedure (e.g. stress test, cath, DCCV, TEE, etc)?   Press F2        :[id number]}  Dispo: ***  Signed, Lonni Hanson, MD

## 2024-04-12 ENCOUNTER — Other Ambulatory Visit: Payer: Self-pay | Admitting: Family Medicine

## 2024-04-12 DIAGNOSIS — Z1231 Encounter for screening mammogram for malignant neoplasm of breast: Secondary | ICD-10-CM

## 2024-05-12 ENCOUNTER — Ambulatory Visit: Attending: Internal Medicine | Admitting: Internal Medicine

## 2024-05-12 ENCOUNTER — Encounter: Payer: Self-pay | Admitting: Internal Medicine

## 2024-05-12 VITALS — BP 140/82 | HR 63 | Ht 64.0 in | Wt 159.0 lb

## 2024-05-12 DIAGNOSIS — G4733 Obstructive sleep apnea (adult) (pediatric): Secondary | ICD-10-CM

## 2024-05-12 DIAGNOSIS — R0989 Other specified symptoms and signs involving the circulatory and respiratory systems: Secondary | ICD-10-CM

## 2024-05-12 DIAGNOSIS — I471 Supraventricular tachycardia, unspecified: Secondary | ICD-10-CM

## 2024-05-12 DIAGNOSIS — E1169 Type 2 diabetes mellitus with other specified complication: Secondary | ICD-10-CM

## 2024-05-12 DIAGNOSIS — I25118 Atherosclerotic heart disease of native coronary artery with other forms of angina pectoris: Secondary | ICD-10-CM | POA: Diagnosis not present

## 2024-05-12 DIAGNOSIS — I214 Non-ST elevation (NSTEMI) myocardial infarction: Secondary | ICD-10-CM

## 2024-05-12 DIAGNOSIS — E785 Hyperlipidemia, unspecified: Secondary | ICD-10-CM

## 2024-05-12 DIAGNOSIS — I25110 Atherosclerotic heart disease of native coronary artery with unstable angina pectoris: Secondary | ICD-10-CM

## 2024-05-12 NOTE — Patient Instructions (Signed)

## 2024-05-12 NOTE — Progress Notes (Unsigned)
 Cardiology Office Note:  .   Date:  05/13/2024  ID:  SHANDELL GIOVANNI, DOB 04-Jun-1951, MRN [id number] PCP: Jyl Railing, MD  [facility] Providers Cardiologist:  Lonni Hanson, MD Cardiology APP:  Orie Ritta BIRCH, PA-C (Inactive)     History of Present Illness: .   Erica Brock is a 73 y.o. female with history of coronary artery disease status post CABG (11/2020; LIMA-LAD, SVG-D1, SVG-OM1, and SVG-PDA), PSVT, hypertension, hyperlipidemia, type 2 diabetes mellitus, obstructive of sleep apnea, obesity, provoked DVT/PE, rheumatoid arthritis, anxiety, and cystic mastopathy, who presents for follow-up of coronary artery disease.  I last saw her in February, at which time she reported less frequent and severe fatigue though she continue to have sporadic fatigue associated with spikes in her blood pressure.  She noted that she had not been using her CPAP since CABG in 2022, as she found it difficult to sleep with the mask immediately after her surgery.  We elected to defer medication changes; I referred Ms. Horen to pulmonology for reevaluation of her sleep apnea.  She elected not to proceed with this consultation and instead opted to work with her dentist.  Today, Ms. Schwan reports that she has noticed more fatigue since her last visit.  She was recently diagnosed with a rheumatoid arthritis flare and is on some additional medications (not corticosteroids due to concern that they could increase her blood pressure and blood sugar).  She has not had any chest pain, shortness of breath, or palpitations.  She notes occasional lightheadedness but has not passed out or fallen.  Chronic mild dependent edema is stable.  She is using her as needed furosemide  2-3 times a month.  She is now sleeping in bed rather than a recliner and overall feels like she is getting better rest with less daytime fatigue.  She reports that she was told by another provider that she would not qualify for CPAP.  Home blood  pressure readings are typically better than today's measurement in the office (cuff was correlated today).  Most readings range between 90-125/60-80.  ROS: See HPI  Studies Reviewed: SABRA   EKG Interpretation Date/Time:  Thursday May 12 2024 09:21:11 EDT Ventricular Rate:  63 PR Interval:  182 QRS Duration:  90 QT Interval:  424 QTC Calculation: 433 R Axis:   -41  Text Interpretation: Normal sinus rhythm Left axis deviation Pulmonary disease pattern RSR' or QR pattern in V1 suggests right ventricular conduction delay Abnormal ECG When compared with ECG of 14-Sep-2023 11:49, No significant change was found Confirmed by Maxtyn Nuzum, Lonni [phone]) on 05/13/2024 8:53:57 AM    Risk Assessment/Calculations:         Physical Exam:   VS:  BP (!) 140/82   Pulse 63   Ht 5' 4 (1.626 m)   Wt 159 lb (72.1 kg)   SpO2 97%   BMI 27.29 kg/m    Wt Readings from Last 3 Encounters:  05/12/24 159 lb (72.1 kg)  09/28/23 163 lb 12.8 oz (74.3 kg)  09/14/23 165 lb (74.8 kg)    General:  NAD.  Accompanied by her husband. Neck: No JVD or HJR. Lungs: Clear to auscultation bilaterally without wheezes or crackles. Heart: Regular rate and rhythm without murmurs, rubs, or gallops. Abdomen: Soft, nontender, nondistended. Extremities: Trace pretibial edema bilaterally.  ASSESSMENT AND PLAN: .    Coronary artery disease with stable angina: No angina reported.  Chronic fatigue is likely multifactorial.  Catheterization in 06/2023 again showed severe native CAD  and occluded SVG-D2.  LIMA-LAD, SVG-OM1, and SVG-RPDA were all patent.  I do not think that repeat ischemia evaluation is indicated at this time.  We will continue her current regimen of aspirin  and rosuvastatin  for secondary prevention as well as antianginal therapy with low-dose amlodipine .  Labile hypertension and obstructive sleep apnea: Blood pressure mildly elevated today but typically better and more stable at home.  I suspect there may be an  element of whitecoat hypertension contributing to her elevated blood pressure today as well as effects of recently diagnosed RA flare.  Defer medication changes at this time.  Continue management of OSA per her dentist.  Hyperlipidemia associated with type 2 diabetes mellitus: LDL very well-controlled at 48 on last check in 12/2023.  Continue rosuvastatin  40 mg daily.    Dispo: Return to clinic in 6 months.  Signed, Lonni Hanson, MD

## 2024-05-13 ENCOUNTER — Encounter: Payer: Self-pay | Admitting: Internal Medicine

## 2024-05-18 ENCOUNTER — Ambulatory Visit
Admission: RE | Admit: 2024-05-18 | Discharge: 2024-05-18 | Disposition: A | Discharge status: Home / Self Care | Source: Ambulatory Visit | Attending: Family Medicine | Admitting: Family Medicine

## 2024-05-18 DIAGNOSIS — Z1231 Encounter for screening mammogram for malignant neoplasm of breast: Secondary | ICD-10-CM | POA: Insufficient documentation

## 2024-07-22 ENCOUNTER — Other Ambulatory Visit: Payer: Self-pay | Admitting: Internal Medicine

## 2024-07-26 ENCOUNTER — Other Ambulatory Visit: Payer: Self-pay | Admitting: Family Medicine

## 2024-07-26 DIAGNOSIS — Z780 Asymptomatic menopausal state: Secondary | ICD-10-CM
